# Patient Record
Sex: Male | Born: 1935
Health system: Southern US, Community
[De-identification: ages and names within clinical notes are randomized; demographics above are authoritative.]

## PROBLEM LIST (undated history)

## (undated) DIAGNOSIS — I340 Nonrheumatic mitral (valve) insufficiency: Secondary | ICD-10-CM

## (undated) DIAGNOSIS — I493 Ventricular premature depolarization: Secondary | ICD-10-CM

## (undated) DIAGNOSIS — I5189 Other ill-defined heart diseases: Secondary | ICD-10-CM

## (undated) DIAGNOSIS — I10 Essential (primary) hypertension: Secondary | ICD-10-CM

## (undated) DIAGNOSIS — E785 Hyperlipidemia, unspecified: Secondary | ICD-10-CM

## (undated) HISTORY — DX: Hyperlipidemia, unspecified: E78.5

## (undated) HISTORY — DX: Ventricular premature depolarization: I49.3

## (undated) HISTORY — DX: Essential (primary) hypertension: I10

## (undated) HISTORY — DX: Other ill-defined heart diseases: I51.89

## (undated) HISTORY — DX: Nonrheumatic mitral (valve) insufficiency: I34.0

---

## 1999-01-27 ENCOUNTER — Ambulatory Visit (HOSPITAL_BASED_OUTPATIENT_CLINIC_OR_DEPARTMENT_OTHER): Admission: RE | Admit: 1999-01-27 | Discharge: 1999-01-27 | Payer: Self-pay | Admitting: General Surgery

## 2001-04-02 ENCOUNTER — Ambulatory Visit (HOSPITAL_COMMUNITY): Admission: RE | Admit: 2001-04-02 | Discharge: 2001-04-02 | Payer: Self-pay | Admitting: Gastroenterology

## 2002-04-13 ENCOUNTER — Emergency Department (HOSPITAL_COMMUNITY): Admission: EM | Admit: 2002-04-13 | Discharge: 2002-04-14 | Payer: Self-pay | Admitting: Internal Medicine

## 2002-09-19 ENCOUNTER — Encounter: Payer: Self-pay | Admitting: Orthopedic Surgery

## 2002-09-25 ENCOUNTER — Encounter: Payer: Self-pay | Admitting: Orthopedic Surgery

## 2002-09-25 ENCOUNTER — Inpatient Hospital Stay (HOSPITAL_COMMUNITY): Admission: RE | Admit: 2002-09-25 | Discharge: 2002-09-28 | Payer: Self-pay | Admitting: Orthopedic Surgery

## 2003-05-07 ENCOUNTER — Other Ambulatory Visit: Admission: RE | Admit: 2003-05-07 | Discharge: 2003-05-07 | Payer: Self-pay | Admitting: Dermatology

## 2006-12-05 ENCOUNTER — Emergency Department (HOSPITAL_COMMUNITY): Admission: EM | Admit: 2006-12-05 | Discharge: 2006-12-05 | Payer: Self-pay | Admitting: Emergency Medicine

## 2007-09-12 ENCOUNTER — Ambulatory Visit (HOSPITAL_COMMUNITY): Admission: RE | Admit: 2007-09-12 | Discharge: 2007-09-12 | Payer: Self-pay | Admitting: Internal Medicine

## 2009-10-26 ENCOUNTER — Ambulatory Visit (HOSPITAL_COMMUNITY): Admission: RE | Admit: 2009-10-26 | Discharge: 2009-10-26 | Payer: Self-pay | Admitting: Internal Medicine

## 2010-05-28 ENCOUNTER — Ambulatory Visit: Payer: Self-pay | Admitting: Cardiology

## 2010-11-05 NOTE — Op Note (Signed)
NAME:  Shawn Stephenson, Shawn Stephenson NO.:  [id number]   MEDICAL RECORD NO.:  [id number]                   PATIENT TYPE:  INP   LOCATION:  0003                                 FACILITY:  [facility]   PHYSICIAN:  Georges Lynch. Darrelyn Hillock, M.D.             DATE OF BIRTH:  [birth year]   DATE OF PROCEDURE:  09/25/2002  DATE OF DISCHARGE:                                 OPERATIVE REPORT   SURGEON:  Georges Lynch. Darrelyn Hillock, M.D.   ASSISTANT:  Ebbie Ridge. Paitsel, P.A.   PREOPERATIVE DIAGNOSIS:  Severe degenerative arthritis of the medial joint  space of the right knee with some varus deformity.   POSTOPERATIVE DIAGNOSIS:  Severe degenerative arthritis of the medial joint  space of the right knee with some varus deformity.   OPERATION:  Repeat Repicci uni-knee arthroplasty.   COMPONENTS USED:  The sizes used were a size 48 femoral component, a size 32  tibial insert, and the insert was 6.5 mm thick.  We utilized vancomycin in  the cement.   PROCEDURE:  Under spinal anesthesia, routine orthopedic prepping and draping  of the right lower extremity was carried out.  The leg was exsanguinated  with an Esmarch and tourniquet was elevated to 350 mmHg.  An incision was  made over the medial aspect of the right knee, bleeders were identified and  cauterized.  The incision was carried down through the capsule and into the  knee joint.  The knee was suctioned out.  At this time, I dissected the soft  tissue free from the medial side of the knee and then did a quadriceps snip.  Following this, I then went on and inserted the self-retaining retractors  and removed between 5 and 8 mm thickness off of the posterior aspect of the  femur.  At this particular time, two drill holes were made, one in the tibia  at 45-degree angle and the other in the femur in the midline in the medial  femoral condyle and a distractor device was inserted and the knee was  distracted. Following this, I then went on and made  my articular burr cut  out of the central portion of the tibial plateau in order to accommodate a  6.5-mm thickness, 32 mm diameter tibial insert.  Prior to doing this I did  do a medial meniscectomy.  Once this was completed I then removed the  distraction devices and then burred down the articular surface of the femur.  A drill hole was made in the central portion of the femur after we utilized  the gouge to gouge out the distal femur.  We then made a saw cut connecting  the two holes that were made in the femur in the usual fashion and we then  went to our trials and had an excellent fit with a 48-mm trial femur and the  32 x 6.5 mm tibial insert.  Once this was done  we then injected the capsular  structures with 0.25% Marcaine with epinephrine, irrigated the knee out,  made sure there were no loose fragments into the knee, and made sure all the  spurs were removed from the distal femur medially and superiorly.  I then  cemented both components in simultaneously.  We had sponges up, one  posteriorly medially and one up into the suprapatellar region to make sure  no pieces of cement migrated into their direction.  We then utilized the  freer to remove all loose pieces of cement.  We compressed the two  components together until the cement was hardened.  We went back and made  multiple checks and made sure there were no other loose pieces of cement.  We irrigated the knee out.  We had a nice articulating surface between the  femur.  I then put a little bit of cement in the hole that we made in the  tibia proximally.  I then closed the in layers in  the usual fashion over a Hemovac drain.  We approximated the quadriceps snip  as well.  The skin was closed with metal staples and a sterile Neosporin  dressing was applied.  The patient left the operating room in satisfactory  condition.                                               Ronald A. Darrelyn Hillock, M.D.    RAG/MEDQ  D:  09/25/2002  T:   09/25/2002  Job:  [id number]

## 2010-11-05 NOTE — H&P (Signed)
NAME:  Shawn Stephenson, COWANS NO.:  [id number]   MEDICAL RECORD NO.:  [id number]                   PATIENT TYPE:  INP   LOCATION:  NA                                   FACILITY:  [facility]   PHYSICIAN:  Georges Lynch. Gioffre, M.D.             DATE OF BIRTH:  02-26-1930   DATE OF ADMISSION:  09/25/2002  DATE OF DISCHARGE:                                HISTORY & PHYSICAL   HISTORY OF PRESENT ILLNESS:  The patient has had right knee pain over the  past year.  The patient has had increasing pain in his right knee.  He has  had his knee injected in the past with Cortizone and is also taking non-  steroidal anti-inflammatory pain medications.  He is no longer getting  relief from these therapies.  He had an x-ray of his right knee which  revealed complete collapse of the medial joint space, and the patient has  elected to proceed with a right unicondylar knee replacement.   ALLERGIES:  1. CODEINE.  2. VICODIN.  3. METHAL.   PRIMARY CARE PHYSICIAN:  Dr. Ouida Sills in [place].   CURRENT MEDICATIONS:  1. Norvasc 10 mg daily.  2. Hydrochlorothiazide 25 mg daily.  3. Zebeta 5 mg daily.  4. Allegra 180 mg daily.  5. Lipitor 10 mg daily.   PAST MEDICAL HISTORY:  1. Hypertension.  2. Hemorrhoids.  3. Enlarged prostate.  4. Kidney stones.  5. Osteoarthritis.  6. Non-insulin dependent diabetes mellitus.   PAST SURGICAL HISTORY:  1. The patient had a hernia repair on the left in 1990.  2. Hernia repair on the right in 2000.   FAMILY HISTORY:  The patient's mother and father had hypertension.  Father  had diabetes.  Mother and father both have coronary artery disease.   SOCIAL HISTORY:  The patient is married.  He is currently retired.  He has  two children.  His wife will be the primary caregiver after surgery.  He  lives in a two story dwelling with 14 stairs in his home.   REVIEW OF SYMPTOMS:  GENERAL:  Denies weight change, fever, chills, fatigue.  HEENT:   Denies headache, visual changes, tinnitus, hearing loss, sore  throat.  CARDIOVASCULAR:  Denies chest pain, palpitations, shortness of  breath, hypertension.  PULMONARY:  Denies dyspnea, wheezing, cough, sputum  production, or hemoptysis.  GASTROINTESTINAL:  Denies dysphagia, nausea,  vomiting, hematemesis, or abdominal pain.  GENITOURINARY:  Denies dysuria,  frequency, urgency, nocturia, hematuria.  ENDOCRINE:  Denies polyuria,  polydipsia, appetite changes, or heat or cold intolerance.  MUSCULOSKELETAL:  The patient has pain in his right knee, otherwise negative.  NEUROLOGIC:  Denies dizziness, vertigo, syncope, seizures.  SKIN:  Denies itching,  rashes, masses, or moles.   PHYSICAL EXAMINATION:  VITAL SIGNS:  Temperature 98.1, pulse 64,  respirations 18, blood pressure 130/70 right arm sitting.  GENERAL:  A 77-  year-old male  in no acute distress.  HEENT:  Pupils equal, round, reactive  to light.  Extraocular movements were intact.  Pharynx clear.  Tympanic  membranes intact.  NECK:  Supple without masses.  CHEST:  Clear to  auscultation bilaterally.  No wheezes, rhonchi, or rales noted.  HEART:  Regular rate and rhythm without murmurs, rubs, or gallops.  ABDOMEN:  Positive bowel sounds, soft, nontender, no organomegaly or abnormal masses.  EXTREMITIES:  Examination of the right knee reveals significant genu varus,  painful range of motion, lacks 5 degrees for complete flexion and extension.  SKIN:  Warm and dry.   LABORATORY DATA:  X-ray of his right knee reveals complete collapse of the  medial joint space.   IMPRESSION:  Osteoarthritis of the right knee medial joint.   PLAN:  The patient is to be admitted to [facility] on 09/25/02, to  undergo a right unicondylar knee replacement.       Ebbie Ridge. Paitsel, P.A.                     Ronald A. Darrelyn Hillock, M.D.    Tilden Dome  D:  09/20/2002  T:  09/20/2002  Job:  [id number]

## 2010-11-05 NOTE — Procedures (Signed)
[facility]  Patient:    Shawn Stephenson, Shawn Stephenson Visit Number: [id number] MRN: [id number]          Service Type: END Location: ENDO Attending Physician:  Rich Brave Dictated by:   Florencia Reasons, M.D. Proc. Date: 04/02/01 Admit Date:  04/02/2001   CC:         Dr. Carylon Perches   Procedure Report  PROCEDURE PERFORMED:  Colonoscopy.  ENDOSCOPIST:  Florencia Reasons, M.D.  INDICATIONS FOR PROCEDURE:  Screening for colon cancer in an asymptomatic 77 year old.  FINDINGS:  Right-sided diverticulosis.  DESCRIPTION OF PROCEDURE:  The nature, purpose and risks of the procedure had been discussed with the patient, who provided written consent.  Sedation was fentanyl 50 mcg and Versed 5 mg IV without arrhythmias or desaturation. The digital exam of the prostate was normal as was perianal exam.  The adult Olympus video colonoscope was advanced without much difficult to the cecum, turning the patient into the supine position and applying some external abdominal compression to control looping.  The cecum was identified by the absence of further lumen and visualization of what appeared to be the appendiceal orifice.  Pullback was then performed.  The quality of the prep was very good and it is felt that all areas were adequately seen.  There was mild to moderate right-sided diverticular disease without diverticula noted elsewhere in the colon.  No polyps, cancer, colitis, or vascular malformations were noted and retroflexion in the rectum was unremarkable.  No biopsies were obtained.  The patient tolerated the procedure well and there were no apparent complications.  IMPRESSION:  Normal screening exam except for right-sided diverticulosis.  PLAN:  Screening sigmoidoscopy in five years, to be followed perhaps five years thereafter by repeat colonoscopy if the patient remains in good general health.Dictated by:   Florencia Reasons,  M.D. Attending Physician:  Rich Brave DD:  04/02/01 TD:  04/02/01 Job: [id number]

## 2010-11-05 NOTE — Discharge Summary (Signed)
NAME:  Shawn Stephenson, COLL NO.:  [id number]   MEDICAL RECORD NO.:  [id number]                   PATIENT TYPE:  INP   LOCATION:  0477                                 [facility]ACILITY:  [facility]   PHYSICIAN:  Georges Lynch. Darrelyn Hillock, M.D.             DATE OF BIRTH:  14-Nov-1929   DATE OF ADMISSION:  09/25/2002  DATE OF DISCHARGE:  09/28/2002                                 DISCHARGE SUMMARY   ADMISSION DIAGNOSES:  1. Osteoarthritis right knee.  2. Hypertension.  3. Hemorrhoids.  4. Large prostate.  5. Kidney stones.  6. Non-insulin-dependent diabetes mellitus.   DISCHARGE DIAGNOSES:  1. Status post unicondylar knee replacement right knee.  2. Osteoarthritis right knee.  3. Hypertension.  4. Hemorrhoids.  5. Enlarged prostate.  6. Kidney stones.  7. Non-insulin-dependent diabetes.   PROCEDURE:  The patient was taken to the operating room 09/25/2002, to  undergo a right unicondylar knee replacement, Repicci.  Surgeon Georges Lynch.  Darrelyn Hillock, M.D.  Assistant Ebbie Ridge. Paitsel, P.A.   CONSULTS:  PT, OT.   BRIEF HISTORY:  This patient is a 77 year old male who has had right knee  pain over the past year.  The patient had increasing pain in his right knee.  His knee was injected in the past with cortisone.  He is also taking  nonsteroidal anti-inflammatory pain medications.  He is no longer getting  relief from either of these therapies.  He had an x-ray of his right knee  which revealed complete collapse of the medial joint space and the patient  has elected to proceed with a unicondylar knee replacement.   LABORATORY DATA:  Pre-admission CBC:  WBC 5.4, hemoglobin 15.2, hematocrit  43.8, platelet count 181,000.  Pre-admission chemistry completely normal  except elevated glucose at 201.  Pre-admission urinalysis normal.  The  patient's blood type A-.  Negative antibody screen.  Pre-admission x-ray of  right knee reveals complete collapse of the medial joint space.  Pre-  admission chest x-ray:  No active disease.  Postoperative x-ray of the right  knee reveals medial right knee joint replacement in good position and  alignment.  I do not locate a pre-admission EKG on the patient's medical  record.   HOSPITAL COURSE:  The patient was admitted to [facility] on  09/25/2002, to undergo Repicci right unicondylar knee replacement.  The  patient tolerated the procedure well and was allowed to return to the  recovery room and then to the orthopedic floor to continue his postoperative  care.  The patient was placed on PC analgesia for pain control.  He was  weaned over to oral analgesics on postoperative day one.  Physical therapy  was consulted for gait training.  The patient was able to ambulate  postoperative day one in the room with a walker.  By postoperative day two  the patient was ambulating with the walker in the hallway.  The patient  progressed fairly well and it was felt that he could be discharged home on  postoperative day three.  The patient's hemoglobin and hematocrit were  followed throughout his hospitalization.  He had a very minimal decrease in  his hemoglobin postoperatively which stabilized at 13.6, hematocrit 39.0  prior to discharge.   DISPOSITION:  The patient was discharged home on 09/28/2002.   DISCHARGE MEDICATIONS:  1. OxyContin 20 mg one q.12h. as needed for pain.  2. Robaxin 500 mg q.6h. as needed for muscle spasm.  3. Coumadin 5 mg daily.   ACTIVITY:  Full weightbearing with walker.  [facility].   FOLLOWUP:  The patient is to follow up with Windy Fast A. Gioffre, M.D. two  weeks from the date of surgery.  He will call to schedule the appointment.   CONDITION ON DISCHARGE:  Improved.     Ebbie Ridge. Paitsel, P.A.                     Ronald A. Darrelyn Hillock, M.D.    Tilden Dome  D:  10/09/2002  T:  10/09/2002  Job:  [id number]

## 2010-11-23 ENCOUNTER — Encounter: Payer: Self-pay | Admitting: *Deleted

## 2010-11-24 ENCOUNTER — Ambulatory Visit (INDEPENDENT_AMBULATORY_CARE_PROVIDER_SITE_OTHER): Payer: Medicare Other | Admitting: Cardiology

## 2010-11-24 DIAGNOSIS — I519 Heart disease, unspecified: Secondary | ICD-10-CM | POA: Insufficient documentation

## 2010-11-24 DIAGNOSIS — E7800 Pure hypercholesterolemia, unspecified: Secondary | ICD-10-CM | POA: Insufficient documentation

## 2010-11-24 DIAGNOSIS — E119 Type 2 diabetes mellitus without complications: Secondary | ICD-10-CM | POA: Insufficient documentation

## 2010-11-24 DIAGNOSIS — I119 Hypertensive heart disease without heart failure: Secondary | ICD-10-CM

## 2010-11-24 NOTE — Assessment & Plan Note (Signed)
This elderly gentleman has a past history of essential hypertension.  He has been feeling well since last visit.  He has not been expressing any headaches or dizzy spells.  He has good exercise tolerance and does all of his own yard work including mowing the grass.

## 2010-11-24 NOTE — Assessment & Plan Note (Signed)
The patient has a past history of diastolic dysfunction.  He has not been expressing any symptoms of congestive heart failure.

## 2010-11-24 NOTE — Progress Notes (Signed)
Shawn Stephenson Date of Birth:  October 11, 1929 [facility] [address]   [address] [place], [place]  [address] [phone]           Fax   [phone]  History of Present Illness: This 77 year old gentleman is seen for a six-month followup office visit.  He has a history of hyperlipidemia and essential hypertension.  His echocardiogram in 2008 showed mild pulmonary hypertension mild mitral regurgitation and mild aortic insufficiency as well as diastolic dysfunction.  The patient has had a history of chronic PVCs.  He is not aware of any palpitations or tachycardia.  Patient has a history of diabetes.  He also has a history of BPH  Current Outpatient Prescriptions  Medication Sig Dispense Refill  . ALPHA LIPOIC ACID PO Take 200 mg by mouth daily.        Marland Kitchen amLODipine (NORVASC) 10 MG tablet Take 10 mg by mouth daily.        Marland Kitchen aspirin 81 MG tablet Take 81 mg by mouth daily.        Marland Kitchen atorvastatin (LIPITOR) 10 MG tablet Take 5 mg by mouth daily.        . carvedilol (COREG) 6.25 MG tablet Take 6.25 mg by mouth 2 (two) times daily with a meal.        . Cholecalciferol (VITAMIN D) 2000 UNITS CAPS Take 1 capsule by mouth daily.        . fexofenadine (ALLEGRA) 180 MG tablet Take 180 mg by mouth daily.        Marland Kitchen glipiZIDE (GLUCOTROL) 5 MG 24 hr tablet Take 5 mg by mouth daily.        . hydrochlorothiazide 25 MG tablet Take 25 mg by mouth daily.        Marland Kitchen MAGNESIUM PO Take by mouth daily.        . Misc Natural Products (PROSTATE) CAPS Take by mouth 2 (two) times daily.        . Multiple Vitamins-Minerals (OCUVITE PRESERVISION PO) Take 2 tablets by mouth daily.        . Omega-3 Fatty Acids (FISH OIL) 1200 MG CAPS Take 1,200 mg by mouth daily.        Marland Kitchen Ubiquinol 100 MG CAPS Take by mouth 2 (two) times daily.        . valsartan (DIOVAN) 80 MG tablet Take 80 mg by mouth daily.        . Pyridoxine HCl (VITAMIN B-6 PO) Take 2 capsules by mouth daily.        Marland Kitchen DISCONTD:  multivitamin (THERAGRAN) per tablet Take 1 tablet by mouth daily.        Marland Kitchen DISCONTD: Saw Palmetto, Serenoa repens, (SAW PALMETTO PO) Take 1 capsule by mouth daily.          Allergies  Allergen Reactions  . Vasotec Swelling    Patient Active Problem List  Diagnoses  . Benign hypertensive heart disease without heart failure  . Diabetes mellitus  . Hypercholesterolemia  . Diastolic dysfunction, left ventricle    History  Smoking status  . Never Smoker   Smokeless tobacco  . Not on file    History  Alcohol Use No    Family History  Problem Relation Age of Onset  . Stroke Father   . Diabetes Father   . Hypertension Mother   . Hypertension Father   . Arthritis Mother   . Hypertension Brother   . Stroke Brother   .  Kidney disease Brother     Review of Systems: Constitutional: no fever chills diaphoresis or fatigue or change in weight.  Head and neck: no hearing loss, no epistaxis, no photophobia or visual disturbance. Respiratory: No cough, shortness of breath or wheezing. Cardiovascular: No chest pain peripheral edema, palpitations. Gastrointestinal: No abdominal distention, no abdominal pain, no change in bowel habits hematochezia or melena. Genitourinary: No dysuria, no frequency, no urgency, no nocturia. Musculoskeletal:No arthralgias, no back pain, no gait disturbance or myalgias. Neurological: No dizziness, no headaches, no numbness, no seizures, no syncope, no weakness, no tremors. Hematologic: No lymphadenopathy, no easy bruising. Psychiatric: No confusion, no hallucinations, no sleep disturbance.    Physical Exam: Filed Vitals:   11/24/10 1138  BP: 130/70  Pulse: 66  The general appearance reveals a well-developed well-nourished elderly gentleman in no distress.Pupils equal and reactive.   Extraocular Movements are full.  There is no scleral icterus.  The mouth and pharynx are normal.  The neck is supple.  The carotids reveal no bruits.  The jugular venous  pressure is normal.  The thyroid is not enlarged.  There is no lymphadenopathy.The chest is clear to percussion and auscultation. There are no rales or rhonchi. Expansion of the chest is symmetrical.The precordium is quiet.  The first heart sound is normal.  The second heart sound is physiologically split.  There is no  gallop rub or click.There is a soft systolic ejection murmur at the base.  There is no abnormal lift or heave.The abdomen is soft and nontender. Bowel sounds are normal. The liver and spleen are not enlarged. There Are no abdominal masses. There are no bruits.The pedal pulses are good.  There is no phlebitis or edema.  There is no cyanosis or clubbing.Strength is normal and symmetrical in all extremities.  There is no lateralizing weakness.  There are no sensory deficits.The skin is warm and dry.  There is no rash.   Assessment / Plan: Continue present medication.  Recheck 6 months.

## 2011-04-06 LAB — I-STAT 8, (EC8 V) (CONVERTED LAB)
Acid-Base Excess: 3 — ABNORMAL HIGH
BUN: 20
Chloride: 103
HCT: 42
Hemoglobin: 14.3
Operator id: 179121
Potassium: 3.3 — ABNORMAL LOW
Sodium: 139

## 2011-04-06 LAB — POCT I-STAT CREATININE
Creatinine, Ser: 1.1
Operator id: 179121

## 2011-04-06 LAB — POCT CARDIAC MARKERS: Troponin i, poc: <0.05

## 2011-06-01 ENCOUNTER — Encounter: Payer: Self-pay | Admitting: Cardiology

## 2011-06-01 ENCOUNTER — Ambulatory Visit (INDEPENDENT_AMBULATORY_CARE_PROVIDER_SITE_OTHER): Payer: Medicare Other | Admitting: Cardiology

## 2011-06-01 VITALS — BP 124/78 | HR 52 | Ht 72.0 in | Wt 164.0 lb

## 2011-06-01 DIAGNOSIS — E7800 Pure hypercholesterolemia, unspecified: Secondary | ICD-10-CM

## 2011-06-01 DIAGNOSIS — I119 Hypertensive heart disease without heart failure: Secondary | ICD-10-CM

## 2011-06-01 DIAGNOSIS — I493 Ventricular premature depolarization: Secondary | ICD-10-CM | POA: Insufficient documentation

## 2011-06-01 DIAGNOSIS — I519 Heart disease, unspecified: Secondary | ICD-10-CM

## 2011-06-01 DIAGNOSIS — I4949 Other premature depolarization: Secondary | ICD-10-CM

## 2011-06-01 NOTE — Assessment & Plan Note (Signed)
Patient is not having any symptoms of congestive heart failure.  He is not having any exertional chest discomfort or angina pectoris.  He has not been aware of any palpitations

## 2011-06-01 NOTE — Assessment & Plan Note (Signed)
The patient has a history of asymptomatic PVCs.  He has not had any dizzy spells or syncope related to arrhythmia

## 2011-06-01 NOTE — Assessment & Plan Note (Signed)
The patient has not been having any dizzy spells or headaches.  No exertional dyspnea.  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

## 2011-06-01 NOTE — Progress Notes (Signed)
Shawn Stephenson Date of Birth:  1929-10-30 [facility] / [facility] [address]   [address] [place], [place]  [address] [phone]           Fax   [phone]  History of Present Illness: This pleasant elderly Caucasian male is seen for a scheduled 6 month followup office visit.  He has a history of hyperlipidemia and essential hypertension.  An echocardiogram in 2008 showing mild pulmonary hypertension, mild mitral regurgitation, and mild aortic insufficiency.  Left ventricular function showed diastolic dysfunction.  Patient has a history of chronic PVCs.  These are asymptomatic.  He has a history of diabetes and BPH  Current Outpatient Prescriptions  Medication Sig Dispense Refill  . ALPHA LIPOIC ACID PO Take 200 mg by mouth daily.        Marland Kitchen amLODipine (NORVASC) 10 MG tablet Take 10 mg by mouth daily.        Marland Kitchen aspirin 81 MG tablet Take 81 mg by mouth daily.        Marland Kitchen atorvastatin (LIPITOR) 10 MG tablet Take 5 mg by mouth daily.        . carvedilol (COREG) 6.25 MG tablet Take 6.25 mg by mouth 2 (two) times daily with a meal.        . Cholecalciferol (VITAMIN D) 2000 UNITS CAPS Take 1 capsule by mouth daily. Taking 5000 daily      . fexofenadine (ALLEGRA) 180 MG tablet Take 180 mg by mouth daily.        Marland Kitchen glipiZIDE (GLUCOTROL) 5 MG 24 hr tablet Take 5 mg by mouth daily.        . hydrochlorothiazide 25 MG tablet Take 25 mg by mouth daily.        Marland Kitchen MAGNESIUM PO Take by mouth daily.        . Misc Natural Products (PROSTATE) CAPS Take by mouth 2 (two) times daily.        . Multiple Vitamins-Minerals (OCUVITE PRESERVISION PO) Take 2 tablets by mouth daily.        . Omega-3 Fatty Acids (FISH OIL) 1200 MG CAPS Take 1,200 mg by mouth daily.        . Pyridoxine HCl (VITAMIN B-6 PO) Take 2 capsules by mouth daily.        . Red Yeast Rice Extract (RED YEAST RICE PO) Take by mouth 2 (two) times daily.        Marland Kitchen Ubiquinol 100 MG CAPS Take by mouth 2 (two) times daily.        .  valsartan (DIOVAN) 80 MG tablet Take 80 mg by mouth daily.          Allergies  Allergen Reactions  . Vasotec Swelling    Patient Active Problem List  Diagnoses  . Benign hypertensive heart disease without heart failure  . Diabetes mellitus  . Hypercholesterolemia  . Diastolic dysfunction, left ventricle    History  Smoking status  . Never Smoker   Smokeless tobacco  . Not on file    History  Alcohol Use No    Family History  Problem Relation Age of Onset  . Stroke Father   . Diabetes Father   . Hypertension Mother   . Hypertension Father   . Arthritis Mother   . Hypertension Brother   . Stroke Brother   . Kidney disease Brother     Review of Systems: Constitutional: no fever chills diaphoresis or fatigue or change in weight.  Head and neck:  no hearing loss, no epistaxis, no photophobia or visual disturbance. Respiratory: No cough, shortness of breath or wheezing. Cardiovascular: No chest pain peripheral edema, palpitations. Gastrointestinal: No abdominal distention, no abdominal pain, no change in bowel habits hematochezia or melena. Genitourinary: No dysuria, no frequency, no urgency, no nocturia. Musculoskeletal:No arthralgias, no back pain, no gait disturbance or myalgias. Neurological: No dizziness, no headaches, no numbness, no seizures, no syncope, no weakness, no tremors. Hematologic: No lymphadenopathy, no easy bruising. Psychiatric: No confusion, no hallucinations, no sleep disturbance.    Physical Exam: Filed Vitals:   06/01/11 1429  BP: 124/78  Pulse: 52   the general appearance reveals a well-developed well-nourished elderly gentleman in no distress.  Pupils equal and reactive.   Extraocular Movements are full.  There is no scleral icterus.  The mouth and pharynx are normal.  The neck is supple.  The carotids reveal no bruits.  The jugular venous pressure is normal.  The thyroid is not enlarged.  There is no lymphadenopathy.  The chest is clear  to percussion and auscultation. There are no rales or rhonchi. Expansion of the chest is symmetrical.  The heart reveals a soft systolic ejection murmur at the base.  No definite aortic insufficiency murmur heard today.  No gallop or rub.The abdomen is soft and nontender. Bowel sounds are normal. The liver and spleen are not enlarged. There Are no abdominal masses. There are no bruits.  The pedal pulses are good.  There is no phlebitis or edema.  There is no cyanosis or clubbing. Strength is normal and symmetrical in all extremities.  There is no lateralizing weakness.  There are no sensory deficits.  The skin is warm and dry.  There is no rash.  EKG shows sinus bradycardia with frequent premature ventricular beats and a pattern of left anterior hemiblock and moderate voltage for LVH  Assessment / Plan: Continue same medication.  Recheck in 6 months for office visit and EKG.  His family physician follows his blood work.

## 2011-06-01 NOTE — Patient Instructions (Signed)
Your physician recommends that you continue on your current medications as directed. Please refer to the Current Medication list given to you today.  Your physician wants you to follow-up in: 6 months. You will receive a reminder letter in the mail two months in advance. If you don't receive a letter, please call our office to schedule the follow-up appointment.  

## 2011-07-06 ENCOUNTER — Other Ambulatory Visit: Payer: Self-pay | Admitting: *Deleted

## 2011-07-06 MED ORDER — CARVEDILOL 6.25 MG PO TABS: 6.2500 mg | ORAL_TABLET | Freq: Two times a day (BID) | ORAL | Status: DC

## 2011-09-22 ENCOUNTER — Other Ambulatory Visit: Payer: Self-pay | Admitting: *Deleted

## 2011-09-22 MED ORDER — VALSARTAN 80 MG PO TABS: 80.0000 mg | ORAL_TABLET | Freq: Every day | ORAL | Status: DC

## 2011-10-03 ENCOUNTER — Other Ambulatory Visit: Payer: Self-pay | Admitting: *Deleted

## 2011-10-03 MED ORDER — ATORVASTATIN CALCIUM 10 MG PO TABS: 5.0000 mg | ORAL_TABLET | Freq: Every day | ORAL | Status: DC

## 2011-10-03 NOTE — Telephone Encounter (Signed)
Refilled atorvastatin

## 2011-12-02 ENCOUNTER — Ambulatory Visit (INDEPENDENT_AMBULATORY_CARE_PROVIDER_SITE_OTHER): Payer: Medicare Other | Admitting: Cardiology

## 2011-12-02 ENCOUNTER — Encounter: Payer: Self-pay | Admitting: Cardiology

## 2011-12-02 VITALS — BP 130/78 | HR 57 | Ht 72.0 in | Wt 163.0 lb

## 2011-12-02 DIAGNOSIS — I4949 Other premature depolarization: Secondary | ICD-10-CM

## 2011-12-02 DIAGNOSIS — E7800 Pure hypercholesterolemia, unspecified: Secondary | ICD-10-CM

## 2011-12-02 DIAGNOSIS — I493 Ventricular premature depolarization: Secondary | ICD-10-CM

## 2011-12-02 DIAGNOSIS — I119 Hypertensive heart disease without heart failure: Secondary | ICD-10-CM

## 2011-12-02 DIAGNOSIS — I519 Heart disease, unspecified: Secondary | ICD-10-CM

## 2011-12-02 NOTE — Patient Instructions (Addendum)
Your physician recommends that you continue on your current medications as directed. Please refer to the Current Medication list given to you today.  Your physician wants you to follow-up in: 6 months. You will receive a reminder letter in the mail two months in advance. If you don't receive a letter, please call our office to schedule the follow-up appointment.  

## 2011-12-02 NOTE — Assessment & Plan Note (Signed)
The patient has a history of hypercholesterolemia and is on Lipitor 10 mg daily.  Also takes red yeast rice.  His lipids are monitored by his primary care physician

## 2011-12-02 NOTE — Progress Notes (Signed)
Patient ID: Shawn Stephenson, male   DOB: Jun 18, 1930, 78 y.o.   MRN: [id number]

## 2011-12-02 NOTE — Assessment & Plan Note (Signed)
The patient denies any symptoms of orthopnea or paroxysmal nocturnal dyspnea.  He is not having any significant exertional dyspnea.  He stays physically active.

## 2011-12-02 NOTE — Assessment & Plan Note (Signed)
EKG today does show frequent PVCs including occasional interpolated PVCs.  The patient himself is not really aware of these.  He has had no awareness of tachycardia or palpitations he has not had any symptoms to suggest angina pectoris

## 2011-12-02 NOTE — Assessment & Plan Note (Signed)
Blood pressure has been remaining stable on current therapy.  He has occasional peripheral edema probably secondary to the amlodipine.  The edema is worse if he spends many hours in a day sitting.  If he is physically active the edema resolves.

## 2011-12-02 NOTE — Progress Notes (Signed)
Shawn Stephenson Date of Birth:  June 09, 1930 [facility] [address] [place], [place]  [address] [phone]         Fax   [phone]  History of Present Illness: This pleasant 78 year old gentleman is seen for a scheduled 6 month followup office visit.  He has a past history of frequent PVCs and a past history of hypertensive cardiovascular disease and a history of diastolic dysfunction of the left ventricle.  He also has hypercholesterolemia.  He is a mild diabetic and his most recent A1c taken at Dr. Alonza Smoker office was 6.3.  The patient has been feeling well with no new cardiac symptoms.  He had an echocardiogram in 2008 which showed mild pulmonary hypertension mild mitral regurgitation and mild aortic insufficiency as well as diastolic dysfunction.  Current Outpatient Prescriptions  Medication Sig Dispense Refill  . ALPHA LIPOIC ACID PO Take 200 mg by mouth daily.        Marland Kitchen amLODipine (NORVASC) 10 MG tablet Take 10 mg by mouth daily.        Marland Kitchen aspirin 81 MG tablet Take 81 mg by mouth daily.        Marland Kitchen atorvastatin (LIPITOR) 10 MG tablet Take 0.5 tablets (5 mg total) by mouth daily.  30 tablet  5  . carvedilol (COREG) 6.25 MG tablet Take 1 tablet (6.25 mg total) by mouth 2 (two) times daily with a meal.  180 tablet  3  . Cholecalciferol (VITAMIN D) 2000 UNITS CAPS Take 1 capsule by mouth daily. Taking 5000 daily      . Cyanocobalamin (VITAMIN B 12 PO) Take by mouth daily.      . fexofenadine (ALLEGRA) 180 MG tablet Take 180 mg by mouth daily.        Marland Kitchen glipiZIDE (GLUCOTROL) 5 MG 24 hr tablet Take 5 mg by mouth daily.        . hydrochlorothiazide 25 MG tablet Take 25 mg by mouth daily.        Marland Kitchen MAGNESIUM PO Take by mouth daily.        . Misc Natural Products (PROSTATE) CAPS Take by mouth 2 (two) times daily.        . Multiple Vitamins-Minerals (OCUVITE PRESERVISION PO) Take 2 tablets by mouth daily.        . Omega-3 Fatty Acids (FISH OIL) 1200 MG CAPS Take 1,200 mg  by mouth daily.        . Red Yeast Rice Extract (RED YEAST RICE PO) Take by mouth 2 (two) times daily.        . valsartan (DIOVAN) 80 MG tablet Take 1 tablet (80 mg total) by mouth daily.  30 tablet  8    Allergies  Allergen Reactions  . Vasotec Swelling    Patient Active Problem List  Diagnosis  . Benign hypertensive heart disease without heart failure  . Diabetes mellitus  . Hypercholesterolemia  . Diastolic dysfunction, left ventricle  . Asymptomatic PVCs    History  Smoking status  . Never Smoker   Smokeless tobacco  . Not on file    History  Alcohol Use No    Family History  Problem Relation Age of Onset  . Stroke Father   . Diabetes Father   . Hypertension Mother   . Hypertension Father   . Arthritis Mother   . Hypertension Brother   . Stroke Brother   . Kidney disease Brother     Review of Systems: Constitutional: no fever chills  diaphoresis or fatigue or change in weight.  Head and neck: no hearing loss, no epistaxis, no photophobia or visual disturbance. Respiratory: No cough, shortness of breath or wheezing. Cardiovascular: No chest pain peripheral edema, palpitations. Gastrointestinal: No abdominal distention, no abdominal pain, no change in bowel habits hematochezia or melena. Genitourinary: No dysuria, no frequency, no urgency, no nocturia. Musculoskeletal:No arthralgias, no back pain, no gait disturbance or myalgias. Neurological: No dizziness, no headaches, no numbness, no seizures, no syncope, no weakness, no tremors. Hematologic: No lymphadenopathy, no easy bruising. Psychiatric: No confusion, no hallucinations, no sleep disturbance.    Physical Exam: Filed Vitals:   12/02/11 1147  BP: 130/78  Pulse: 57   general appearance reveals a well-developed well-nourished gentleman in no distress.The head and neck exam reveals pupils equal and reactive.  Extraocular movements are full.  There is no scleral icterus.  The mouth and pharynx are  normal.  The neck is supple.  The carotids reveal no bruits.  The jugular venous pressure is normal.  The  thyroid is not enlarged.  There is no lymphadenopathy.  The chest is clear to percussion and auscultation.  There are no rales or rhonchi.  Expansion of the chest is symmetrical.  The precordium is quiet.  The first heart sound is normal.  The second heart sound is physiologically split.  There is no murmur gallop rub or click.  There is no abnormal lift or heave.  The pulse is regular with occasional PVCs. The abdomen is soft and nontender.  The bowel sounds are normal.  The liver and spleen are not enlarged.  There are no abdominal masses.  There are no abdominal bruits.  Extremities reveal good pedal pulses.  There is no phlebitis or edema.  There is no cyanosis or clubbing.  Strength is normal and symmetrical in all extremities.  There is no lateralizing weakness.  There are no sensory deficits.  The skin is warm and dry.  There is no rash.  EKG shows sinus bradycardia and occasional PVCs and occasional interpolated PVCs.  He has left anterior fascicular block.   Assessment / Plan: Continue same medication.  Recheck in 6 months for followup office visit.  I considered cutting back on his hydrochlorothiazide because of his diabetes but decided to continue its present dose because of his history of diastolic dysfunction.  His edema is probably from amlodipine rather than from his diastolic heart failure.

## 2012-06-27 ENCOUNTER — Ambulatory Visit (INDEPENDENT_AMBULATORY_CARE_PROVIDER_SITE_OTHER): Payer: Medicare Other | Admitting: Cardiology

## 2012-06-27 ENCOUNTER — Encounter: Payer: Self-pay | Admitting: Cardiology

## 2012-06-27 VITALS — BP 130/72 | HR 59 | Resp 18 | Ht 70.0 in | Wt 163.0 lb

## 2012-06-27 DIAGNOSIS — I119 Hypertensive heart disease without heart failure: Secondary | ICD-10-CM

## 2012-06-27 DIAGNOSIS — I493 Ventricular premature depolarization: Secondary | ICD-10-CM

## 2012-06-27 DIAGNOSIS — I4949 Other premature depolarization: Secondary | ICD-10-CM

## 2012-06-27 MED ORDER — VALSARTAN-HYDROCHLOROTHIAZIDE 80-12.5 MG PO TABS: 1.0000 | ORAL_TABLET | Freq: Every day | ORAL | Status: DC

## 2012-06-27 NOTE — Assessment & Plan Note (Signed)
Patient has not been experiencing any hypoglycemic episodes. 

## 2012-06-27 NOTE — Patient Instructions (Addendum)
Will change your Diovan and HCTZ to Diovan HCT 80/12.5 mg daily, Rx sent to pharmacy  Your physician wants you to follow-up in: 6 months ov/ekg You will receive a reminder letter in the mail two months in advance. If you don't receive a letter, please call our office to schedule the follow-up appointment.

## 2012-06-27 NOTE — Assessment & Plan Note (Signed)
The patient has a past history of PVCs which have not been causing him any symptoms.  No dizziness or syncope.

## 2012-06-27 NOTE — Assessment & Plan Note (Signed)
Patient currently is taking Diovan and hydrochlorothiazide separately and the cost of his Diovan has gone way up.  We are going to switch him to Diovan HCT 80/12.5 once a day which is a generic preparation.  Patient is not having any headaches or any symptoms of CHF.

## 2012-06-27 NOTE — Progress Notes (Signed)
Shawn Stephenson Date of Birth:  11-Sep-1929 [facility] [address] [place], [place]  [address] [phone]         Fax   [phone]  History of Present Illness: This pleasant 79 year old gentleman is seen for a scheduled 6 month followup office visit. He has a past history of frequent PVCs and a past history of hypertensive cardiovascular disease and a history of diastolic dysfunction of the left ventricle. He also has hypercholesterolemia. He is a mild diabetic.  He had recent complete lab work at his primary care physicians and everything was reviewed and checks out okay including thyroid function studies and PSA. The patient has been feeling well with no new cardiac symptoms. He had an echocardiogram in 2008 which showed mild pulmonary hypertension mild mitral regurgitation and mild aortic insufficiency as well as diastolic dysfunction.  He has a new insurance program now with [place] [facility] and he [facility] [facility]   Current Outpatient Prescriptions  Medication Sig Dispense Refill  . amLODipine (NORVASC) 10 MG tablet Take 10 mg by mouth daily.        Marland Kitchen aspirin 81 MG tablet Take 81 mg by mouth daily.        Marland Kitchen atorvastatin (LIPITOR) 10 MG tablet Take 0.5 tablets (5 mg total) by mouth daily.  30 tablet  5  . carvedilol (COREG) 6.25 MG tablet Take 1 tablet (6.25 mg total) by mouth 2 (two) times daily with a meal.  180 tablet  3  . Cholecalciferol (VITAMIN D) 2000 UNITS CAPS Take 1 capsule by mouth daily. Taking 5000 daily      . Cyanocobalamin (VITAMIN B 12 PO) Take by mouth daily.      . fexofenadine (ALLEGRA) 180 MG tablet Take 180 mg by mouth daily.        Marland Kitchen glipiZIDE (GLUCOTROL) 5 MG 24 hr tablet Take 5 mg by mouth daily.        Marland Kitchen MAGNESIUM PO Take by mouth daily.        . Misc Natural Products (PROSTATE) CAPS Take by mouth 2 (two) times daily.        . Multiple Vitamins-Minerals (OCUVITE  PRESERVISION PO) Take 2 tablets by mouth daily.        . Omega-3 Fatty Acids (FISH OIL) 1200 MG CAPS Take 1,200 mg by mouth daily.        . Red Yeast Rice Extract (RED YEAST RICE PO) Take by mouth 2 (two) times daily.        . valsartan-hydrochlorothiazide (DIOVAN HCT) 80-12.5 MG per tablet Take 1 tablet by mouth daily.  90 tablet  3    Allergies  Allergen Reactions  . Vasotec Swelling    Patient Active Problem List  Diagnosis  . Benign hypertensive heart disease without heart failure  . Diabetes mellitus  . Hypercholesterolemia  . Diastolic dysfunction, left ventricle  . Asymptomatic PVCs    History  Smoking status  . Never Smoker   Smokeless tobacco  . Not on file    History  Alcohol Use No    Family History  Problem Relation Age of Onset  . Stroke Father   . Diabetes Father   . Hypertension Mother   . Hypertension Father   . Arthritis Mother   . Hypertension Brother   . Stroke Brother   . Kidney disease Brother     Review of Systems: Constitutional: no fever chills  diaphoresis or fatigue or change in weight.  Head and neck: no hearing loss, no epistaxis, no photophobia or visual disturbance. Respiratory: No cough, shortness of breath or wheezing. Cardiovascular: No chest pain peripheral edema, palpitations. Gastrointestinal: No abdominal distention, no abdominal pain, no change in bowel habits hematochezia or melena. Genitourinary: No dysuria, no frequency, no urgency, no nocturia. Musculoskeletal:No arthralgias, no back pain, no gait disturbance or myalgias. Neurological: No dizziness, no headaches, no numbness, no seizures, no syncope, no weakness, no tremors. Hematologic: No lymphadenopathy, no easy bruising. Psychiatric: No confusion, no hallucinations, no sleep disturbance.    Physical Exam: Filed Vitals:   06/27/12 1352  BP: 130/72  Pulse: 59  Resp: 18   The general appearance reveals a pleasant elderly gentleman in no distress.The head and  neck exam reveals pupils equal and reactive.  Extraocular movements are full.  There is no scleral icterus.  The mouth and pharynx are normal.  The neck is supple.  The carotids reveal no bruits.  The jugular venous pressure is normal.  The  thyroid is not enlarged.  There is no lymphadenopathy.  The chest is clear to percussion and auscultation.  There are no rales or rhonchi.  Expansion of the chest is symmetrical.  The precordium is quiet.  The first heart sound is normal.  The second heart sound is physiologically split.  There is a soft systolic ejection murmur at the left sternal edge and aortic area .There is no abnormal lift or heave.  The abdomen is soft and nontender.  The bowel sounds are normal.  The liver and spleen are not enlarged.  There are no abdominal masses.  There are no abdominal bruits.  Extremities reveal good pedal pulses.  There is no phlebitis or edema.  There is no cyanosis or clubbing.  Strength is normal and symmetrical in all extremities.  There is no lateralizing weakness.  There are no sensory deficits.  The skin is warm and dry.  There is no rash.    Assessment / Plan: Continue on same medication except switch to Diovan HCT.  Recheck in 6 months for followup office visit and EKG.

## 2012-07-09 ENCOUNTER — Other Ambulatory Visit: Payer: Self-pay

## 2012-07-09 MED ORDER — CARVEDILOL 6.25 MG PO TABS: 6.2500 mg | ORAL_TABLET | Freq: Two times a day (BID) | ORAL | Status: DC

## 2012-09-24 ENCOUNTER — Other Ambulatory Visit: Payer: Self-pay | Admitting: *Deleted

## 2012-09-24 MED ORDER — ATORVASTATIN CALCIUM 10 MG PO TABS: 5.0000 mg | ORAL_TABLET | Freq: Every day | ORAL | Status: DC

## 2012-12-18 ENCOUNTER — Ambulatory Visit: Payer: Medicare Other | Admitting: Cardiology

## 2013-01-07 ENCOUNTER — Encounter: Payer: Self-pay | Admitting: Cardiology

## 2013-01-15 ENCOUNTER — Encounter: Payer: Self-pay | Admitting: Cardiology

## 2013-01-15 ENCOUNTER — Ambulatory Visit (INDEPENDENT_AMBULATORY_CARE_PROVIDER_SITE_OTHER): Payer: Medicare Other | Admitting: Cardiology

## 2013-01-15 VITALS — BP 152/72 | HR 48 | Ht 72.0 in | Wt 165.8 lb

## 2013-01-15 DIAGNOSIS — I4949 Other premature depolarization: Secondary | ICD-10-CM

## 2013-01-15 DIAGNOSIS — I119 Hypertensive heart disease without heart failure: Secondary | ICD-10-CM

## 2013-01-15 DIAGNOSIS — I351 Nonrheumatic aortic (valve) insufficiency: Secondary | ICD-10-CM | POA: Insufficient documentation

## 2013-01-15 DIAGNOSIS — I359 Nonrheumatic aortic valve disorder, unspecified: Secondary | ICD-10-CM

## 2013-01-15 DIAGNOSIS — I493 Ventricular premature depolarization: Secondary | ICD-10-CM

## 2013-01-15 MED ORDER — FUROSEMIDE 20 MG PO TABS: 20.0000 mg | ORAL_TABLET | Freq: Every day | ORAL | Status: DC

## 2013-01-15 MED ORDER — LOSARTAN POTASSIUM 50 MG PO TABS: 50.0000 mg | ORAL_TABLET | Freq: Every day | ORAL | Status: DC

## 2013-01-15 NOTE — Assessment & Plan Note (Signed)
The patient is not having any symptoms referable to his aortic insufficiency.  Exercise tolerance is still good.

## 2013-01-15 NOTE — Assessment & Plan Note (Signed)
Blood pressure here in the office shows his systolic blood he may have white coat syndrome.  He wondered to start checking his blood pressure at home twice a week.

## 2013-01-15 NOTE — Patient Instructions (Signed)
Monitor blood pressure 2 times a week at home  STOP DIOVAN HCT  START LOSARTAN 50 MG DAILY  USE LASIX (FUROSEMIDE) 20 MG DAILY AS NEEDED FOR FLUID  Your physician wants you to follow-up in: 6 MONTHS You will receive a reminder letter in the mail two months in advance. If you don't receive a letter, please call our office to schedule the follow-up appointment.

## 2013-01-15 NOTE — Progress Notes (Addendum)
[name] Date of Birth:  Sep 14, 1929 [facility] [address] [place], [place]  [address] [phone]         Fax   [phone]  This pleasant 79 year old gentleman is seen for a scheduled  followup office visit. He has a past history of frequent PVCs and a past history of hypertensive cardiovascular disease and a history of diastolic dysfunction of the left ventricle. He also has hypercholesterolemia. He is a mild diabetic and his most recent A1c taken at Dr. Alonza Smoker office was 5.9 The patient has been feeling well with no new cardiac symptoms. He had an echocardiogram in 2008 which showed mild pulmonary hypertension mild mitral regurgitation and mild aortic insufficiency as well as diastolic dysfunction. He is concerned that his hydrochlorothiazide may be adversely affecting his diabetes    Current Outpatient Prescriptions  Medication Sig Dispense Refill  . amLODipine (NORVASC) 10 MG tablet Take 10 mg by mouth daily.        Marland Kitchen aspirin 81 MG tablet Take 81 mg by mouth daily.        Marland Kitchen atorvastatin (LIPITOR) 10 MG tablet Take 0.5 tablets (5 mg total) by mouth daily.  30 tablet  5  . carvedilol (COREG) 6.25 MG tablet Take 1 tablet (6.25 mg total) by mouth 2 (two) times daily with a meal.  180 tablet  3  . Cholecalciferol (VITAMIN D) 2000 UNITS CAPS Take 1 capsule by mouth daily. Taking 5000 daily      . Cyanocobalamin (VITAMIN B 12 PO) Take by mouth daily.      . fexofenadine (ALLEGRA) 180 MG tablet Take 180 mg by mouth daily.        Marland Kitchen glipiZIDE (GLUCOTROL) 5 MG 24 hr tablet Take 5 mg by mouth daily.        Marland Kitchen MAGNESIUM PO Take by mouth daily.        . Misc Natural Products (PUMPKIN SEED OIL) CAPS Take 2 capsules by mouth daily.      . Multiple Vitamins-Minerals (OCUVITE PRESERVISION PO) Take 2 tablets by mouth daily.        . furosemide (LASIX) 20 MG tablet Take 20 mg by mouth daily as needed.      Marland Kitchen losartan (COZAAR) 50 MG tablet Take 1 tablet (50 mg total)  by mouth daily.  90 tablet  3   No current facility-administered medications for this visit.    Allergies  Allergen Reactions  . Vasotec Swelling    Patient Active Problem List   Diagnosis Date Noted  . Asymptomatic PVCs 06/01/2011  . Benign hypertensive heart disease without heart failure 11/24/2010  . Diabetes mellitus 11/24/2010  . Hypercholesterolemia 11/24/2010  . Diastolic dysfunction, left ventricle 11/24/2010    History  Smoking status  . Never Smoker   Smokeless tobacco  . Not on file    History  Alcohol Use No    Family History  Problem Relation Age of Onset  . Stroke Father   . Diabetes Father   . Hypertension Mother   . Hypertension Father   . Arthritis Mother   . Hypertension Brother   . Stroke Brother   . Kidney disease Brother     Review of Systems: Constitutional: no fever chills diaphoresis or fatigue or change in weight.  Head and neck: no hearing loss, no epistaxis, no photophobia or visual disturbance. Respiratory: No cough, shortness of breath or wheezing. Cardiovascular: No chest pain peripheral edema, palpitations. Gastrointestinal: No abdominal distention, no abdominal  pain, no change in bowel habits hematochezia or melena. Genitourinary: No dysuria, no frequency, no urgency, no nocturia. Musculoskeletal:No arthralgias, no back pain, no gait disturbance or myalgias. Neurological: No dizziness, no headaches, no numbness, no seizures, no syncope, no weakness, no tremors. Hematologic: No lymphadenopathy, no easy bruising. Psychiatric: No confusion, no hallucinations, no sleep disturbance.    Physical Exam: Filed Vitals:   01/15/13 1120  BP: 152/72  Pulse: 48   the general appearance reveals a well-developed well-nourished elderly gentleman in no distress.The head and neck exam reveals pupils equal and reactive.  Extraocular movements are full.  There is no scleral icterus.  The mouth and pharynx are normal.  The neck is supple.  The  carotids reveal no bruits.  The jugular venous pressure is normal.  The  thyroid is not enlarged.  There is no lymphadenopathy.  The chest is clear to percussion and auscultation.  There are no rales or rhonchi.  Expansion of the chest is symmetrical.  The precordium is quiet.  The first heart sound is normal.  The second heart sound is physiologically split.  There is a grade 2/6 systolic ejection murmur at the base.  No diastolic murmur heard.  No gallop or rub. There is no abnormal lift or heave.  The abdomen is soft and nontender.  The bowel sounds are normal.  The liver and spleen are not enlarged.  There are no abdominal masses.  There are no abdominal bruits.  Extremities reveal good pedal pulses.  There is no phlebitis or edema.  There is no cyanosis or clubbing.  Strength is normal and symmetrical in all extremities.  There is no lateralizing weakness.  There are no sensory deficits.  The skin is warm and dry.  There is no rash.     Assessment / Plan: Overall the patient is doing well.  At his request we will stop his hydrochlorothiazide.  2 save costs we will switch him from plain Diovan which is extensive back to losartan 50 mg one daily.  If he has problems with fluid retention he will have Lasix 20 mg on hand for when necessary use only. Recheck in 6 months for followup office visit

## 2013-01-15 NOTE — Assessment & Plan Note (Signed)
The patient has not been experiencing any sensation of palpitations or tachycardia

## 2013-01-16 ENCOUNTER — Telehealth: Payer: Self-pay | Admitting: Cardiology

## 2013-01-16 NOTE — Telephone Encounter (Signed)
New Prob     Pt has some questions regarding FUROSEMIDE and some possible allergies he may have. Please call.

## 2013-01-16 NOTE — Telephone Encounter (Signed)
Discussed with  Dr. Patty Sermons and ok to use, call if any problems

## 2013-01-16 NOTE — Telephone Encounter (Signed)
Patient has allergy to sulfa (was not on our allergy list) and was Rx'd Lasix for PRN use. Patient does not remember reactions but does not think it was severe. Will forward to  Dr. Patty Sermons for review

## 2013-01-23 ENCOUNTER — Telehealth: Payer: Self-pay | Admitting: Cardiology

## 2013-01-23 ENCOUNTER — Other Ambulatory Visit: Payer: Self-pay

## 2013-01-23 NOTE — Telephone Encounter (Signed)
Have him stop the losartan.  I have marked his allergies as including losartan going forward.  Continue his present blood pressure meds.

## 2013-01-23 NOTE — Telephone Encounter (Signed)
New Prob  Pt has started a new Blood pressure and is experiencing pounding in his chest. Since he has started  losarpan 50 mg// He feels as if he is getting extra heart beats.  150/90 pulse 64.

## 2013-01-23 NOTE — Telephone Encounter (Signed)
Returned call to patient he stated he started on Losartan 1 week ago.Stated he don't feel good.Stated since he started Losartan he feels jumpy,hyper,chest pressure.Stated he also had swelling in ankles yesterday and had to take a Lasix.B/P 150/90,150/80,140/80.Message sent to Dr.Brackbill for advice.

## 2013-01-23 NOTE — Telephone Encounter (Signed)
Returned call to patient Dr.Brackbill advised to stop Losartan.Continue present blood pressure medication.Advised to monitor B/P and call back if needed.

## 2013-01-24 ENCOUNTER — Encounter: Payer: Self-pay | Admitting: *Deleted

## 2013-01-24 ENCOUNTER — Telehealth: Payer: Self-pay | Admitting: Cardiology

## 2013-01-24 NOTE — Telephone Encounter (Signed)
Spoke with patient about continuing to monitor BP and HR and to call us if it remained elevated or HR became irregular.

## 2013-01-24 NOTE — Telephone Encounter (Signed)
New prob  Pt wife would like to speak with a nurse regarding her husbands BP.  At 8:30 this morning his BP 170/80 hr 68 and irregular before he took his BP meds. She said after taking his BP meds it was 140/60 with a regular heart.

## 2013-07-13 ENCOUNTER — Other Ambulatory Visit: Payer: Self-pay | Admitting: Cardiology

## 2013-07-17 ENCOUNTER — Telehealth: Payer: Self-pay | Admitting: Cardiology

## 2013-07-17 NOTE — Telephone Encounter (Signed)
New message          Pt wife has questions about husband meds

## 2013-07-17 NOTE — Telephone Encounter (Signed)
Discussed medications and he is currently not taking Losartan as they have been doing.

## 2013-07-30 ENCOUNTER — Ambulatory Visit: Payer: Medicare Other | Admitting: Cardiology

## 2013-08-09 ENCOUNTER — Ambulatory Visit: Payer: Medicare Other | Admitting: Cardiology

## 2013-08-13 ENCOUNTER — Ambulatory Visit: Payer: Medicare Other | Admitting: Cardiology

## 2013-08-13 ENCOUNTER — Telehealth: Payer: Self-pay | Admitting: Nurse Practitioner

## 2013-08-13 NOTE — Telephone Encounter (Signed)
Phone call today - was not able to come to his scheduled visit today due to inclement weather.  Has noted a feeling of "pressure around his throat" for several weeks - seems to be worse with eating - he was planning on talking to Dr. Patty SermonsBrackbill about this today. BP running high as well. 170/70 after medicines today. He was told he could not get an appointment for 6 more weeks.   Will increase the Coreg to 9.75 (1 1/2 pills) BID  Will have my CMA call him tomorrow to get an earlier visit on a day that Dr. Patty SermonsBrackbill is also here in the office.   Patient is agreeable to this plan and will call if any problems develop in the interim.   Rosalio MacadamiaLori C. Emmeline Winebarger, RN, ANP-C [facility] [address] [place], [place]  [id number] [phone]

## 2013-08-14 ENCOUNTER — Telehealth: Payer: Self-pay | Admitting: *Deleted

## 2013-08-14 NOTE — Telephone Encounter (Signed)
Left message on machine for pt to contact the office and I will try later today office will be closing at 3:00 pm today I let pt know if pt tries to call me back

## 2013-08-14 NOTE — Telephone Encounter (Signed)
Message copied by Debbe BalesGONZALEZ, DANIELLE R on Wed Aug 14, 2013  9:41 AM ------      Message from: Rosalio MacadamiaGERHARDT, LORI C      Created: Tue Aug 13, 2013 12:56 PM       Please call on Wednesday - schedule a visit with me on a day that Dr. Patty SermonsBrackbill is here.            See my phone note for details            Please call him on his cell phone - [phone]            lori ------

## 2013-08-16 NOTE — Telephone Encounter (Signed)
S/w pt is agreeable to come in for appointment March 6 with Dr. Patty SermonsBrackbill in the office

## 2013-08-16 NOTE — Telephone Encounter (Signed)
Message copied by Debbe BalesGONZALEZ, DANIELLE R on Fri Aug 16, 2013  9:00 AM ------      Message from: Rosalio MacadamiaGERHARDT, LORI C      Created: Tue Aug 13, 2013 12:56 PM       Please call on Wednesday - schedule a visit with me on a day that Dr. Patty SermonsBrackbill is here.            See my phone note for details            Please call him on his cell phone - [phone]            lori ------

## 2013-08-23 ENCOUNTER — Encounter: Payer: Self-pay | Admitting: Nurse Practitioner

## 2013-08-23 ENCOUNTER — Ambulatory Visit (INDEPENDENT_AMBULATORY_CARE_PROVIDER_SITE_OTHER): Payer: Medicare Other | Admitting: Nurse Practitioner

## 2013-08-23 VITALS — BP 168/79 | HR 57 | Ht 72.0 in | Wt 168.0 lb

## 2013-08-23 DIAGNOSIS — I119 Hypertensive heart disease without heart failure: Secondary | ICD-10-CM

## 2013-08-23 DIAGNOSIS — I519 Heart disease, unspecified: Secondary | ICD-10-CM

## 2013-08-23 DIAGNOSIS — E7800 Pure hypercholesterolemia, unspecified: Secondary | ICD-10-CM

## 2013-08-23 LAB — BASIC METABOLIC PANEL
BUN: 20 mg/dL (ref 6–23)
CO2: 31 mEq/L (ref 19–32)
Calcium: 9.3 mg/dL (ref 8.4–10.5)
Chloride: 101 mEq/L (ref 96–112)
Creatinine, Ser: 0.9 mg/dL (ref 0.4–1.5)
GFR: 88.09 mL/min (ref >60.00)
Glucose, Bld: 114 mg/dL — ABNORMAL HIGH (ref 70–99)
Potassium: 4.1 mEq/L (ref 3.5–5.1)
Sodium: 140 mEq/L (ref 135–145)

## 2013-08-23 MED ORDER — HYDROCHLOROTHIAZIDE 25 MG PO TABS: 25.0000 mg | ORAL_TABLET | Freq: Every day | ORAL | Status: DC

## 2013-08-23 NOTE — Patient Instructions (Addendum)
Stay on your current medicines but I am adding back the HCTZ 25 mg daily for your blood pressure  We will check lab today  We will set up a stress test (low level exercise/Lexiscan)  See Dr. Patty SermonsBrackbill in 3 to 4 weeks with repeat lab  Call the [facility] office at [phone] if you have any questions, problems or concerns.

## 2013-08-23 NOTE — Progress Notes (Signed)
Shawn Stephenson Date of Birth: Nov 03, 1929 Medical Record #[id number]  History of Present Illness: Mr. Shawn Stephenson is seen back today for a follow up visit. Seen for Dr. Patty Sermons. He is an 80 year old male with known PVCs, HLD, HTN, borderline DM and diastolic dysfunction. Last echo from 2011 showed mild pulmonary HTN, mild MR and mild AI along with diastolic dysfunction. Labs are generally checked by his PCP.  Last seen here back in July and was felt to be doing well. Had some medicine changes due to costs and concerns. Since that visit, he has had his blood pressure medicine changed over to Coreg. Losartan   Was to have been seen last month but got rescheduled due to inclement weather. Called and actually spoke with me since I was covering the outpatient setting to report that he had been having some "pressure around his throat" and BP was up. Coreg was increased and his follow up visit was moved up with me.   Comes back today. Here alone. Says he is not sure how he is doing. BP still running high. Thinking about resuming HCTZ. Not dizzy or lightheaded. No actual chest pain but has had a sensation going up his neck/throat - can't tell if its reflux or something else. No exertional symptoms. He is active but not routinely exercising. Rarely using Lasix. Sounds like he does get some extra salt on occasion. No swelling.   Current Outpatient Prescriptions  Medication Sig Dispense Refill  . amLODipine (NORVASC) 10 MG tablet Take 10 mg by mouth daily.        Marland Kitchen aspirin 81 MG tablet Take 81 mg by mouth daily.        Marland Kitchen atorvastatin (LIPITOR) 10 MG tablet Take 0.5 tablets (5 mg total) by mouth daily.  30 tablet  5  . carvedilol (COREG) 6.25 MG tablet 1 1/2 pills BID      . Cholecalciferol (VITAMIN D) 2000 UNITS CAPS Take 1 capsule by mouth daily. Taking 5000 daily      . Cyanocobalamin (VITAMIN B 12 PO) Take by mouth daily.      . fexofenadine (ALLEGRA) 180 MG tablet Take 180 mg by mouth daily.          . furosemide (LASIX) 20 MG tablet Take 20 mg by mouth daily as needed.      Marland Kitchen glipiZIDE (GLUCOTROL) 5 MG 24 hr tablet Take 5 mg by mouth daily.        Marland Kitchen MAGNESIUM PO Take by mouth daily.        . Misc Natural Products (PUMPKIN SEED OIL) CAPS Take 2 capsules by mouth daily.      . Multiple Vitamins-Minerals (OCUVITE PRESERVISION PO) Take 2 tablets by mouth daily.         No current facility-administered medications for this visit.    Allergies  Allergen Reactions  . Losartan     Makes him feel jittery  . Sulfa Antibiotics   . Vasotec Swelling    Past Medical History  Diagnosis Date  . Hypertension   . Hyperlipidemia   . Mitral regurgitation     mild  . Diastolic dysfunction   . Diabetes mellitus     well controlled  . Asymptomatic PVCs     History reviewed. No pertinent past surgical history.  History  Smoking status  . Never Smoker   Smokeless tobacco  . Not on file    History  Alcohol Use No    Family History  Problem  Relation Age of Onset  . Stroke Father   . Diabetes Father   . Hypertension Mother   . Hypertension Father   . Arthritis Mother   . Hypertension Brother   . Stroke Brother   . Kidney disease Brother     Review of Systems: The review of systems is per the HPI.  All other systems were reviewed and are negative.  Physical Exam: BP 168/79  Pulse 57  Ht 6' (1.829 m)  Wt 168 lb (76.204 kg)  BMI 22.78 kg/m2 Patient is alert and in no acute distress. Skin is warm and dry. Color is normal.  HEENT is unremarkable. Normocephalic/atraumatic. PERRL. Sclera are nonicteric. Neck is supple. No masses. No JVD. Lungs are clear. Cardiac exam shows a regular rate and rhythm. Abdomen is soft. Extremities are without edema. Gait and ROM are intact. No gross neurologic deficits noted.  LABORATORY DATA:  EKG today shows sinus brady/arrhythmia with LAFB.   BMET pending  Lab Results  Component Value Date   HGB 14.3 12/05/2006   HCT 42.0 12/05/2006    GLUCOSE 118* 12/05/2006   NA 139 12/05/2006   K 3.3* 12/05/2006   CL 103 12/05/2006   CREATININE 1.1 12/05/2006   BUN 20 12/05/2006    Assessment / Plan: 1. Throat discomfort - seems atypical but has multiple risk factors - will arrange for Myoview - probably a low level Lexiscan.   2. HTN - BP not at goal - he is wanting to resume HCTZ - I have restarted 25 mg daily - check BMET today  3. HLD - on statin  4. Diastolic dysfunction - seems compensated but needs modification of his risk factors. Encouraged salt restriction and BP control.   Patient is agreeable to this plan and will call if any problems develop in the interim.   Shawn Stephenson Innes, RN, ANP-C [facility] [address] [place], [place]  [id number] [phone]

## 2013-09-05 ENCOUNTER — Telehealth: Payer: Self-pay | Admitting: Cardiology

## 2013-09-05 NOTE — Telephone Encounter (Signed)
Advised wife  

## 2013-09-05 NOTE — Telephone Encounter (Signed)
Okay to postpone the Myoview until he gets back

## 2013-09-05 NOTE — Telephone Encounter (Signed)
Wife phoned stating that patient had to go out of town secondary to brother not doing well and is Hospice. Wife concerned and wants to make sure he does not need to come home for myoview (scheduled for 3/23) He has been gone since Friday so she is unsure how he is doing. She did check his blood pressure day after he was seen by Dawayne PatriciaLori G NP and blood pressure systolic 10 points better. She will see if he can start monitoring blood pressure while he is away. Will forward to  Dr. Patty [facility]ermonsBrackbill to see [facility]

## 2013-09-05 NOTE — Telephone Encounter (Signed)
New Prob   Has some questions regarding upcoming stress test appointment. Please call.

## 2013-09-09 ENCOUNTER — Encounter (HOSPITAL_COMMUNITY): Payer: Medicare Other

## 2013-09-14 ENCOUNTER — Other Ambulatory Visit: Payer: Self-pay | Admitting: Cardiology

## 2013-09-17 ENCOUNTER — Telehealth: Payer: Self-pay | Admitting: Cardiology

## 2013-09-17 ENCOUNTER — Other Ambulatory Visit: Payer: Self-pay | Admitting: Cardiology

## 2013-09-17 NOTE — Telephone Encounter (Signed)
Advised patient and scheduled appointment next week with  Dr. Patty SermonsBrackbill. Wife is very anxious about his blood pressure so she will call back Thursday with blood pressure readings. Will forward to Nathanial MillmanAnita H RN since I will be out and to  Dr. Patty SermonsBrackbill for review

## 2013-09-17 NOTE — Telephone Encounter (Signed)
New message    Pt saw his PCP this week and his bp was 170/80. He was instructed to call Dr Patty SermonsBrackbill and let him know.

## 2013-09-17 NOTE — Telephone Encounter (Signed)
Agree with plan 

## 2013-09-17 NOTE — Telephone Encounter (Signed)
Would just monitor for a day or so and then see if we need to make adjustments.

## 2013-09-17 NOTE — Telephone Encounter (Signed)
Patient just recently lost his brother and was out of town when he had to go to Urgent Care for bronchitis. He had taken codeine and his blood pressure was down to 130/80. Wife has not been checking his blood pressure at home secondary to good reading. Patient went to see PCP today and blood pressure was up to 170/80. Wife checked blood pressure at home and 160/70 heart rate 60. [facility] to [facility]chedule myoview since he had to reschedule secondary to brother. Will forward to Dawayne PatriciaLori G NP to review blood pressure readings, wife is very concerned.

## 2013-09-18 NOTE — Telephone Encounter (Signed)
Advise patient: 

## 2013-09-19 ENCOUNTER — Telehealth: Payer: Self-pay | Admitting: Cardiology

## 2013-09-19 NOTE — Telephone Encounter (Signed)
Agree.  Continue current meds 

## 2013-09-19 NOTE — Telephone Encounter (Signed)
lmtcb

## 2013-09-19 NOTE — Telephone Encounter (Signed)
Patient would like to speak with you regarding her husband, [name] told her to call you if she had additional questions. Please call and advise.

## 2013-09-19 NOTE — Telephone Encounter (Signed)
Wife calling with BP readings.  On 3/31 160/80 HR 60; on 4/1 in AM 130/70 HR 60 at 5 pm 150/80 HR 60; today 4/2 at 12:00 150/80 HR 60. She states he feels OK except is under a lot of stress since his brother died because he is the executor of the estate.  Is scheduled to see Dr. Patty SermonsBrackbill on Tues.  Advised will forward results to him.  Also showed Sunday SpillersLori Gerhardt,NP the readings who stated she would not change any medications.

## 2013-09-20 ENCOUNTER — Other Ambulatory Visit: Payer: Self-pay

## 2013-09-20 MED ORDER — CARVEDILOL 6.25 MG PO TABS: ORAL_TABLET | ORAL | Status: DC

## 2013-09-20 NOTE — Telephone Encounter (Signed)
Advised wife  

## 2013-09-24 ENCOUNTER — Encounter: Payer: Self-pay | Admitting: Cardiology

## 2013-09-24 ENCOUNTER — Ambulatory Visit (INDEPENDENT_AMBULATORY_CARE_PROVIDER_SITE_OTHER): Payer: Medicare Other | Admitting: Cardiology

## 2013-09-24 VITALS — BP 160/80 | HR 64 | Ht 72.0 in | Wt 165.0 lb

## 2013-09-24 DIAGNOSIS — I519 Heart disease, unspecified: Secondary | ICD-10-CM

## 2013-09-24 DIAGNOSIS — I4949 Other premature depolarization: Secondary | ICD-10-CM

## 2013-09-24 DIAGNOSIS — I359 Nonrheumatic aortic valve disorder, unspecified: Secondary | ICD-10-CM

## 2013-09-24 DIAGNOSIS — I119 Hypertensive heart disease without heart failure: Secondary | ICD-10-CM

## 2013-09-24 DIAGNOSIS — I493 Ventricular premature depolarization: Secondary | ICD-10-CM

## 2013-09-24 DIAGNOSIS — IMO0001 Reserved for inherently not codable concepts without codable children: Secondary | ICD-10-CM

## 2013-09-24 DIAGNOSIS — E1165 Type 2 diabetes mellitus with hyperglycemia: Secondary | ICD-10-CM

## 2013-09-24 DIAGNOSIS — I351 Nonrheumatic aortic (valve) insufficiency: Secondary | ICD-10-CM

## 2013-09-24 MED ORDER — CARVEDILOL 12.5 MG PO TABS: 12.5000 mg | ORAL_TABLET | Freq: Two times a day (BID) | ORAL | Status: DC

## 2013-09-24 NOTE — Assessment & Plan Note (Signed)
Blood pressure has been slightly elevated, particularly the systolic.  He brought in a list of his blood pressures which are averaging in the 160/80 range.  Associated with this he has been experiencing some pressure in the neck and upper chest at times.  Is not related to exertion.  Because of his risk factors he is scheduled for a nuclear stress test on 09/30/13.  It will be a walking Lexa scan Myoview.

## 2013-09-24 NOTE — Patient Instructions (Addendum)
INCREASE YOUR CARVEDILOL TO 12.5 MG TWICE A DAY, DOUBLE UP ON THE 6.25 MG TABLETS UNTIL FINISHED AND NEW RX SENT TO PHARMACY FOR 12.5 MG TABLETS  Your physician wants you to follow-up in: 3 MONTH OV/EKG  You will receive a reminder letter in the mail two months in advance. If you don't receive a letter, please call our office to schedule the follow-up appointment.   KEEP YOUR NUCLEAR APPOINTMENT FOR NEXT WEEK

## 2013-09-24 NOTE — Assessment & Plan Note (Signed)
The patient has a long history of asymptomatic PVCs.  He has not had any sustained tachycardia dizziness or syncope

## 2013-09-24 NOTE — Assessment & Plan Note (Signed)
The patient is not having any symptoms to suggest fluid overload.  He is back on his hydrochlorothiazide for blood pressure control.

## 2013-09-24 NOTE — Progress Notes (Signed)
Shawn Stephenson Date of Birth:  [birth year] [address] [place], [place]  [address] [phone]         Fax   [phone]  This pleasant 80 year old gentleman is seen for a scheduled  followup office visit. He has a past history of frequent PVCs and a past history of hypertensive cardiovascular disease and a history of diastolic dysfunction of the left ventricle. He also has hypercholesterolemia. He is a mild diabetic.  Recently he has been having more difficulty with his high blood pressure.  The patient had an EKG on 08/23/13 which showed normal sinus rhythm with left anterior hemiblock and occasional PACs. His last echocardiogram was 10/29/09 and showed left ventricular hypertrophy with normal systolic function and with grade 1 diastolic dysfunction.  There was also mild mitral valve prolapse, mild aortic sclerosis, and mild elevation of pulmonary artery pressure to 44 mm mercury .   Current Outpatient Prescriptions  Medication Sig Dispense Refill  . amLODipine (NORVASC) 10 MG tablet Take 10 mg by mouth daily.        Marland Kitchen aspirin 81 MG tablet Take 81 mg by mouth daily.        Marland Kitchen atorvastatin (LIPITOR) 10 MG tablet TAKE ONE-HALF TABLET BY MOUTH ONCE A DAY  30 tablet  0  . atorvastatin (LIPITOR) 10 MG tablet TAKE ONE-HALF TABLET BY MOUTH ONCE A DAY  15 tablet  0  . carvedilol (COREG) 12.5 MG tablet Take 1 tablet (12.5 mg total) by mouth 2 (two) times daily.  180 tablet  2  . Cholecalciferol (VITAMIN D) 2000 UNITS CAPS Take 1 capsule by mouth daily. Taking 5000 daily      . Cyanocobalamin (VITAMIN B 12 PO) Take by mouth daily.      . fexofenadine (ALLEGRA) 180 MG tablet Take 180 mg by mouth daily.        . furosemide (LASIX) 20 MG tablet Take 20 mg by mouth daily as needed.      Marland Kitchen glipiZIDE (GLUCOTROL) 5 MG 24 hr tablet Take 5 mg by mouth daily.        . hydrochlorothiazide (HYDRODIURIL) 25 MG tablet Take 1 tablet (25 mg total) by mouth daily.  90 tablet  3  . MAGNESIUM PO Take  by mouth daily.        . Misc Natural Products (PUMPKIN SEED OIL) CAPS Take 2 capsules by mouth daily.      . Multiple Vitamins-Minerals (OCUVITE PRESERVISION PO) Take 2 tablets by mouth daily.         No current facility-administered medications for this visit.    Allergies  Allergen Reactions  . Losartan     Makes him feel jittery  . Sulfa Antibiotics   . Vasotec Swelling    Patient Active Problem List   Diagnosis Date Noted  . Mild aortic insufficiency 01/15/2013  . Asymptomatic PVCs 06/01/2011  . Benign hypertensive heart disease without heart failure 11/24/2010  . Diabetes mellitus 11/24/2010  . Hypercholesterolemia 11/24/2010  . Diastolic dysfunction, left ventricle 11/24/2010    History  Smoking status  . Never Smoker   Smokeless tobacco  . Not on file    History  Alcohol Use No    Family History  Problem Relation Age of Onset  . Stroke Father   . Diabetes Father   . Hypertension Mother   . Hypertension Father   . Arthritis Mother   . Hypertension Brother   . Stroke Brother   . Kidney disease  Brother     Review of Systems: Constitutional: no fever chills diaphoresis or fatigue or change in weight.  Head and neck: no hearing loss, no epistaxis, no photophobia or visual disturbance. Respiratory: No cough, shortness of breath or wheezing. Cardiovascular: No chest pain peripheral edema, palpitations. Gastrointestinal: No abdominal distention, no abdominal pain, no change in bowel habits hematochezia or melena. Genitourinary: No dysuria, no frequency, no urgency, no nocturia. Musculoskeletal:No arthralgias, no back pain, no gait disturbance or myalgias. Neurological: No dizziness, no headaches, no numbness, no seizures, no syncope, no weakness, no tremors. Hematologic: No lymphadenopathy, no easy bruising. Psychiatric: No confusion, no hallucinations, no sleep disturbance.    Physical Exam: Filed Vitals:   09/24/13 1451  BP: 160/80  Pulse: 64   the  general appearance reveals a well-developed well-nourished elderly gentleman in no distress.The head and neck exam reveals pupils equal and reactive.  Extraocular movements are full.  There is no scleral icterus.  The mouth and pharynx are normal.  The neck is supple.  The carotids reveal no bruits.  The jugular venous pressure is normal.  The  thyroid is not enlarged.  There is no lymphadenopathy.  The chest is clear to percussion and auscultation.  There are no rales or rhonchi.  Expansion of the chest is symmetrical.  The precordium is quiet.  The first heart sound is normal.  The second heart sound is physiologically split.  There is a grade 2/6 systolic ejection murmur at the base.  No diastolic murmur heard.  No gallop or rub. There is no abnormal lift or heave.  The abdomen is soft and nontender.  The bowel sounds are normal.  The liver and spleen are not enlarged.  There are no abdominal masses.  There are no abdominal bruits.  Extremities reveal good pedal pulses.  There is no phlebitis or edema.  There is no cyanosis or clubbing.  Strength is normal and symmetrical in all extremities.  There is no lateralizing weakness.  There are no sensory deficits.  The skin is warm and dry.  There is no rash.     Assessment / Plan: Continue same medication except increase carvedilol to 12.5 mg twice a day. Return next week for walking Lexa scan Myoview. Recheck in 3 months for office visit and EKG Consider followup echocardiogram after next office visit to evaluate basilar systolic murmur.  His last echo was in 2011.

## 2013-09-30 ENCOUNTER — Ambulatory Visit (INDEPENDENT_AMBULATORY_CARE_PROVIDER_SITE_OTHER): Payer: Medicare Other | Admitting: *Deleted

## 2013-09-30 ENCOUNTER — Ambulatory Visit: Payer: Medicare Other | Admitting: Cardiology

## 2013-09-30 ENCOUNTER — Ambulatory Visit (HOSPITAL_COMMUNITY): Payer: Medicare Other | Attending: Cardiovascular Disease | Admitting: Radiology

## 2013-09-30 VITALS — BP 142/80 | Ht 72.0 in | Wt 163.0 lb

## 2013-09-30 DIAGNOSIS — R079 Chest pain, unspecified: Secondary | ICD-10-CM

## 2013-09-30 DIAGNOSIS — I493 Ventricular premature depolarization: Secondary | ICD-10-CM

## 2013-09-30 DIAGNOSIS — I119 Hypertensive heart disease without heart failure: Secondary | ICD-10-CM

## 2013-09-30 DIAGNOSIS — I4949 Other premature depolarization: Secondary | ICD-10-CM

## 2013-09-30 DIAGNOSIS — E7800 Pure hypercholesterolemia, unspecified: Secondary | ICD-10-CM

## 2013-09-30 DIAGNOSIS — I519 Heart disease, unspecified: Secondary | ICD-10-CM

## 2013-09-30 DIAGNOSIS — R002 Palpitations: Secondary | ICD-10-CM | POA: Insufficient documentation

## 2013-09-30 LAB — BASIC METABOLIC PANEL
BUN: 18 mg/dL (ref 6–23)
CO2: 27 mEq/L (ref 19–32)
Calcium: 9.2 mg/dL (ref 8.4–10.5)
Chloride: 101 mEq/L (ref 96–112)
Creatinine, Ser: 0.8 mg/dL (ref 0.4–1.5)
GFR: 102.74 mL/min (ref >60.00)
GLUCOSE: 144 mg/dL — AB (ref 70–99)
POTASSIUM: 4.2 meq/L (ref 3.5–5.1)
Sodium: 139 mEq/L (ref 135–145)

## 2013-09-30 MED ORDER — TECHNETIUM TC 99M SESTAMIBI GENERIC - CARDIOLITE
33.0000 | Freq: Once | INTRAVENOUS | Status: AC | PRN
  Administered 2013-09-30: 33 via INTRAVENOUS

## 2013-09-30 MED ORDER — TECHNETIUM TC 99M SESTAMIBI GENERIC - CARDIOLITE
10.8000 | Freq: Once | INTRAVENOUS | Status: AC | PRN
  Administered 2013-09-30: 11 via INTRAVENOUS

## 2013-09-30 MED ORDER — REGADENOSON 0.4 MG/5ML IV SOLN
0.4000 mg | Freq: Once | INTRAVENOUS | Status: AC
  Administered 2013-09-30: 0.4 mg via INTRAVENOUS

## 2013-09-30 NOTE — Progress Notes (Signed)
[facility] S[facility] NUCLEAR MED [address] [place], [place] [address] [phone]    Cardiology Nuclear Med Study  Janeann ForehandKenneth G Stephenson is a 80 y.o. male     MRN : [id number]     DOB: [birth year]  Procedure Date: 09/30/2013  Nuclear Med Background Indication for Stress Test:  Evaluation for Ischemia History:  5/11 EF: 55-60%  Cardiac Risk Factors: Hypertension, Lipids and NIDDM  Symptoms:  Chest Pain and Palpitations   Nuclear Pre-Procedure Caffeine/Decaff Intake:  None NPO After: 9:00am   Lungs:  clear O2 Sat: 95% on room air. IV 0.9% NS with Angio Cath:  22g  IV Site: R Hand  IV Started by:  Cathlyn Parsonsynthia Hasspacher, RN  Chest Size (in):  42 Cup Size: n/a  Height: 6' (1.829 m)  Weight:  163 lb (73.936 kg)  BMI:  Body mass index is 22.1 kg/(m^2). Tech Comments:  Coreg taken at 0800 today    Nuclear Med Study 1 or 2 day study: 1 day  Stress Test Type:  Treadmill/Lexiscan  Reading MD: n/a  Order Authorizing Provider:  Villa Herbhomas Brackbill,MD  Resting Radionuclide: Technetium 2918m Sestamibi  Resting Radionuclide Dose: 11.0 mCi   Stress Radionuclide:  Technetium 2518m Sestamibi  Stress Radionuclide Dose: 33.0 mCi           Stress Protocol Rest HR: 56 Stress HR: 78  Rest BP: 142/80 Stress BP: 157/66  Exercise Time (min): n/a METS: n/a   Predicted Max HR: 138 bpm % Max HR: 56.52 bpm Rate Pressure Product: 6084   Dose of Adenosine (mg):  n/a Dose of Lexiscan: 0.4 mg  Dose of Atropine (mg): n/a Dose of Dobutamine: n/a mcg/kg/min (at max HR)  Stress Test Technologist: Milana NaSabrina Williams, EMT-P  Nuclear Technologist:  Doyne Keelonya Yount, CNMT     Rest Procedure:  Myocardial perfusion imaging was performed at rest 45 minutes following the intravenous administration of Technetium 418m Sestamibi. Rest ECG: NSR - Normal EKG  Stress Procedure:  The patient received IV Lexiscan 0.4 mg over 15-seconds.  Technetium 6918m Sestamibi injected at 30-seconds. The patient had sob and was lt. Headed  with the Lexiscan injection.  Quantitative spect images were obtained after a 45 minute delay. Stress ECG: No significant change from baseline ECG  QPS Raw Data Images:  Normal; no motion artifact; normal heart/lung ratio. Stress Images:  There is decreased uptake in the anterior wall. Rest Images:  There is decreased uptake in the apex. Subtraction (SDS):  Small, mostly fixed anteroapical defect. SDS 1, Extent 6%. Transient Ischemic Dilatation (Normal <1.22):  1.08 Lung/Heart Ratio (Normal <0.45):  0.29  Quantitative Gated Spect Images QGS EDV:  N/A ml QGS ESV:  N/A ml  Impression Exercise Capacity:  Lexiscan with low level exercise. BP Response:  Normal blood pressure response. Clinical Symptoms:  No significant symptoms noted. ECG Impression:  No significant ECG changes with Lexiscan. Comparison with Prior Nuclear Study: No previous nuclear study performed  Overall Impression:  Low risk stress nuclear study with small (6%), mostly fixed (SDS 1) distal anteroapical defect..  LV Ejection Fraction: Study not gated.  LV Wall Motion:  Non-gated  Chrystie NoseKenneth C. Sophiah Rolin, MD, [facility] CenterFACC Board Certified in Nuclear Cardiology Attending Cardiologist [facility]

## 2013-10-01 NOTE — Progress Notes (Signed)
Quick Note:  Please report to patient. The recent labs are stable. Continue same medication and careful diet. The kidney function is still normal. Potassium is normal. The blood sugar is higher. Continue to watch diet carefully. Continue present medications. ______

## 2013-10-04 ENCOUNTER — Telehealth: Payer: Self-pay | Admitting: *Deleted

## 2013-10-04 NOTE — Telephone Encounter (Signed)
Message copied by Burnell BlanksPRATT, Deneise Getty B on Fri Oct 04, 2013  8:35 AM ------      Message from: Cassell ClementBRACKBILL, THOMAS      Created: Tue Oct 01, 2013  7:46 AM       Please report to patient.  The recent labs are stable. Continue same medication and careful diet.  The kidney function is still normal.  Potassium is normal.  The blood sugar is higher.  Continue to watch diet carefully.  Continue present medications. ------

## 2013-10-04 NOTE — Telephone Encounter (Signed)
Advised patient of lab results and myoview results

## 2013-10-04 NOTE — Telephone Encounter (Signed)
Message copied by Burnell BlanksPRATT, Durga Saldarriaga B on Fri Oct 04, 2013  8:35 AM ------      Message from: Cassell ClementBRACKBILL, THOMAS      Created: Tue Oct 01, 2013  9:20 PM       Please report. Stress test was good. No ischemia. Very small apical scar.Continue same meds. ------

## 2013-12-03 ENCOUNTER — Encounter: Payer: Self-pay | Admitting: Cardiology

## 2013-12-18 ENCOUNTER — Other Ambulatory Visit: Payer: Self-pay | Admitting: Cardiology

## 2014-01-14 ENCOUNTER — Encounter: Payer: Self-pay | Admitting: Cardiology

## 2014-01-14 ENCOUNTER — Ambulatory Visit (INDEPENDENT_AMBULATORY_CARE_PROVIDER_SITE_OTHER): Payer: Medicare Other | Admitting: Cardiology

## 2014-01-14 VITALS — BP 140/74 | HR 51 | Ht 72.0 in | Wt 170.8 lb

## 2014-01-14 DIAGNOSIS — I351 Nonrheumatic aortic (valve) insufficiency: Secondary | ICD-10-CM

## 2014-01-14 DIAGNOSIS — I119 Hypertensive heart disease without heart failure: Secondary | ICD-10-CM

## 2014-01-14 DIAGNOSIS — E7800 Pure hypercholesterolemia, unspecified: Secondary | ICD-10-CM

## 2014-01-14 DIAGNOSIS — I4949 Other premature depolarization: Secondary | ICD-10-CM

## 2014-01-14 DIAGNOSIS — I519 Heart disease, unspecified: Secondary | ICD-10-CM

## 2014-01-14 DIAGNOSIS — I493 Ventricular premature depolarization: Secondary | ICD-10-CM

## 2014-01-14 DIAGNOSIS — I359 Nonrheumatic aortic valve disorder, unspecified: Secondary | ICD-10-CM

## 2014-01-14 NOTE — Progress Notes (Signed)
Shawn Stephenson Date of Birth:  July 06, 1929 [facility] [address] [place], [place]  [address] [phone]        Fax   [phone]   History of Present Illness:  This pleasant 80 year old gentleman is seen for a scheduled followup office visit. He has a past history of frequent PVCs and a past history of hypertensive cardiovascular disease and a history of diastolic dysfunction of the left ventricle. He also has hypercholesterolemia. He is a mild diabetic. . The patient had an EKG on 08/23/13 which showed normal sinus rhythm with left anterior hemiblock and occasional PACs.  The patient had a Myoview stress test on 10/01/13 which showed no ischemia and showed a very small apical scar.  The study was not gated because of PVCs and PACs. His last echocardiogram was 10/29/09 and showed left ventricular hypertrophy with normal systolic function and with grade 1 diastolic dysfunction. There was also mild mitral valve prolapse, mild aortic sclerosis, and mild elevation of pulmonary artery pressure to 44 mm mercury . Since last visit the patient has been feeling better.  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   Current Outpatient Prescriptions  Medication Sig Dispense Refill  . amLODipine (NORVASC) 10 MG tablet Take 10 mg by mouth daily.        Marland Kitchen aspirin 81 MG tablet Take 81 mg by mouth daily.        Marland Kitchen atorvastatin (LIPITOR) 10 MG tablet TAKE ONE-HALF TABLET BY MOUTH ONCE A DAY  15 tablet  6  . carvedilol (COREG) 12.5 MG tablet Take 1 tablet (12.5 mg total) by mouth 2 (two) times daily.  180 tablet  2  . Cholecalciferol (VITAMIN D) 2000 UNITS CAPS Take 1 capsule by mouth daily. Taking 5000 daily      . Cyanocobalamin (VITAMIN B 12 PO) Take by mouth daily.      . fexofenadine (ALLEGRA) 180 MG tablet Take 180 mg by mouth daily.        . furosemide (LASIX) 20 MG tablet Take 20 mg by  mouth daily as needed.      Marland Kitchen glipiZIDE (GLUCOTROL) 5 MG 24 hr tablet Take 5 mg by mouth daily.        . hydrochlorothiazide (HYDRODIURIL) 25 MG tablet Take 1 tablet (25 mg total) by mouth daily.  90 tablet  3  . MAGNESIUM PO Take by mouth daily.        . Multiple Vitamins-Minerals (OCUVITE PRESERVISION PO) Take 2 tablets by mouth daily.         No current facility-administered medications for this visit.    Allergies  Allergen Reactions  . Losartan     Makes him feel jittery  . Sulfa Antibiotics   . Vasotec Swelling    Patient Active Problem List   Diagnosis Date Noted  . Mild aortic insufficiency 01/15/2013  . Asymptomatic PVCs 06/01/2011  . Benign hypertensive heart disease without heart failure 11/24/2010  . Diabetes mellitus 11/24/2010  . Hypercholesterolemia 11/24/2010  . Diastolic dysfunction, left ventricle 11/24/2010    History  Smoking status  . Never Smoker   Smokeless tobacco  . Not on file    History  Alcohol Use No    Family History  Problem Relation Age of Onset  . Stroke Father   . Diabetes Father   . Hypertension Mother   . Hypertension Father   .  Arthritis Mother   . Hypertension Brother   . Stroke Brother   . Kidney disease Brother     Review of Systems: Constitutional: no fever chills diaphoresis or fatigue or change in weight.  Head and neck: no hearing loss, no epistaxis, no photophobia or visual disturbance. Respiratory: No cough, shortness of breath or wheezing. Cardiovascular: No chest pain peripheral edema, palpitations. Gastrointestinal: No abdominal distention, no abdominal pain, no change in bowel habits hematochezia or melena. Genitourinary: No dysuria, no frequency, no urgency, no nocturia. Musculoskeletal:No arthralgias, no back pain, no gait disturbance or myalgias. Neurological: No dizziness, no headaches, no numbness, no seizures, no syncope, no weakness, no tremors. Hematologic: No lymphadenopathy, no easy  bruising. Psychiatric: No confusion, no hallucinations, no sleep disturbance.    Physical Exam: Filed Vitals:   01/14/14 1047  BP: 140/74  Pulse: 51   general appearance reveals a well-developed well-nourished gentleman in no distress.The head and neck exam reveals pupils equal and reactive.  Extraocular movements are full.  There is no scleral icterus.  The mouth and pharynx are normal.  The neck is supple.  The carotids reveal no bruits.  The jugular venous pressure is normal.  The  thyroid is not enlarged.  There is no lymphadenopathy.  The chest is clear to percussion and auscultation.  There are no rales or rhonchi.  Expansion of the chest is symmetrical.  The precordium is quiet.  The first heart sound is normal.  The second heart sound is physiologically split.  There is grade 2/6 systolic ejection murmur at the base. There is no abnormal lift or heave.  The abdomen is soft and nontender.  The bowel sounds are normal.  The liver and spleen are not enlarged.  There are no abdominal masses.  There are no abdominal bruits.  Extremities reveal good pedal pulses.  There is no phlebitis or edema.  There is no cyanosis or clubbing.  Strength is normal and symmetrical in all extremities.  There is no lateralizing weakness.  There are no sensory deficits.  The skin is warm and dry.  There is no rash.  EKG today shows marked sinus bradycardia with sinus arrhythmia and left anterior hemiblock and no ischemic changes.   Assessment / Plan: 1. hypertensive heart disease without heart failure 2. Diastolic dysfunction 3. left anterior hemiblock with frequent PVCs 4. aortic valve disease with mild aortic insufficiency  Plan: Continue same medication.  Return for a 2-D echo to evaluate his aortic valve at his LV function.  Check in 4 months for office visit.

## 2014-01-14 NOTE — Assessment & Plan Note (Signed)
The patient has a known heart murmur.  His last echocardiogram showed mild aortic insufficiency.  We will have him return for updated echocardiogram to assess his LV function and his aortic valve status.

## 2014-01-14 NOTE — Assessment & Plan Note (Signed)
The patient has been having fewer PVCs. Overall he has felt better on his current regimen which includes daily HCTZ

## 2014-01-14 NOTE — Patient Instructions (Signed)
Your physician has requested that you have an echocardiogram. Echocardiography is a painless test that uses sound waves to create images of your heart. It provides your doctor with information about the size and shape of your heart and how well your heart's chambers and valves are working. This procedure takes approximately one hour. There are no restrictions for this procedure. NEEDS TO BE SCHEDULED IN 1 MONTH  Your physician recommends that you continue on your current medications as directed. Please refer to the Current Medication list given to you today.  Your physician recommends that you schedule a follow-up appointment in: 4 month ov

## 2014-01-14 NOTE — Assessment & Plan Note (Signed)
Blood pressure has been remaining stable on current medication.  The patient has not been having chest pain or shortness of breath dizziness or syncope or palpitations.

## 2014-02-10 ENCOUNTER — Ambulatory Visit (HOSPITAL_COMMUNITY): Payer: Medicare Other | Attending: Cardiology | Admitting: Cardiology

## 2014-02-10 DIAGNOSIS — I519 Heart disease, unspecified: Secondary | ICD-10-CM

## 2014-02-10 DIAGNOSIS — I359 Nonrheumatic aortic valve disorder, unspecified: Secondary | ICD-10-CM | POA: Diagnosis not present

## 2014-02-10 DIAGNOSIS — I351 Nonrheumatic aortic (valve) insufficiency: Secondary | ICD-10-CM

## 2014-02-10 NOTE — Progress Notes (Signed)
Echo performed. 

## 2014-05-19 ENCOUNTER — Encounter: Payer: Self-pay | Admitting: Cardiology

## 2014-05-19 ENCOUNTER — Ambulatory Visit (INDEPENDENT_AMBULATORY_CARE_PROVIDER_SITE_OTHER): Payer: Medicare Other | Admitting: Cardiology

## 2014-05-19 VITALS — BP 136/72 | HR 52 | Ht 72.0 in | Wt 166.0 lb

## 2014-05-19 DIAGNOSIS — E7800 Pure hypercholesterolemia, unspecified: Secondary | ICD-10-CM

## 2014-05-19 DIAGNOSIS — I119 Hypertensive heart disease without heart failure: Secondary | ICD-10-CM

## 2014-05-19 DIAGNOSIS — I422 Other hypertrophic cardiomyopathy: Secondary | ICD-10-CM

## 2014-05-19 DIAGNOSIS — I517 Cardiomegaly: Secondary | ICD-10-CM | POA: Insufficient documentation

## 2014-05-19 NOTE — Patient Instructions (Signed)
Your physician recommends that you continue on your current medications as directed. Please refer to the Current Medication list given to you today.  Your physician wants you to follow-up in: 6 month ov/ekg You will receive a reminder letter in the mail two months in advance. If you don't receive a letter, please call our office to schedule the follow-up appointment.  

## 2014-05-19 NOTE — Assessment & Plan Note (Signed)
His blood pressure was remaining normal on current therapy.  He is not having any dizziness or syncope.  No chest pain or shortness of breath.  He gets his exercise by doing yard work.

## 2014-05-19 NOTE — Progress Notes (Signed)
Shawn Stephenson Date of Birth:  1929-07-21 [facility] [address] [place], [place]  [address] [phone]        Fax   [phone]   History of Present Illness:  This pleasant 80 year old gentleman is seen for a scheduled followup office visit. He has a past history of frequent PVCs and a past history of hypertensive cardiovascular disease and a history of diastolic dysfunction of the left ventricle. He also has hypercholesterolemia. He is a mild diabetic. . The patient had an EKG on 08/23/13 which showed normal sinus rhythm with left anterior hemiblock and occasional PACs.  The patient had a Myoview stress test on 10/01/13 which showed no ischemia and showed a very small apical scar.  The study was not gated because of PVCs and PACs. His last echocardiogram was 02/10/14 and showed normal left ventricular systolic function with ejection fraction of 65%.  There was aortic valve sclerosis without stenosis.. Since last visit the patient has been feeling better.  He mentioned that his daughter who is in her 32s has been diagnosed with HOCM and is considering surgery at the [facility] clinic.   Current Outpatient Prescriptions  Medication Sig Dispense Refill  . ACCU-CHEK AVIVA PLUS test strip     . ACCU-CHEK SOFTCLIX LANCETS lancets     . amLODipine (NORVASC) 10 MG tablet Take 10 mg by mouth daily.      Marland Kitchen aspirin 81 MG tablet Take 81 mg by mouth daily.      Marland Kitchen atorvastatin (LIPITOR) 10 MG tablet Take 10 mg by mouth as directed. 1/2 tablet daily    . Blood Glucose Monitoring Suppl (ACCU-CHEK AVIVA PLUS) W/DEVICE KIT     . carvedilol (COREG) 12.5 MG tablet Take 1 tablet (12.5 mg total) by mouth 2 (two) times daily. 180 tablet 2  . Cholecalciferol (VITAMIN D) 2000 UNITS CAPS Take 1 capsule by mouth daily. Taking 5000 daily    . Cyanocobalamin (VITAMIN B 12 PO) Take 2,500 mg by mouth daily.     . fexofenadine (ALLEGRA) 180 MG tablet Take 180 mg by mouth daily.      .  furosemide (LASIX) 20 MG tablet Take 20 mg by mouth daily as needed.    Marland Kitchen glipiZIDE (GLUCOTROL) 5 MG 24 hr tablet Take 5 mg by mouth daily.      Marland Kitchen glucosamine-chondroitin 500-400 MG tablet Take 1 tablet by mouth 3 (three) times daily.    . hydrochlorothiazide (HYDRODIURIL) 25 MG tablet Take 1 tablet (25 mg total) by mouth daily. 90 tablet 3  . MAGNESIUM PO Take 400 mg by mouth daily.     . Multiple Vitamins-Minerals (OCUVITE PRESERVISION PO) Take 2 tablets by mouth daily.      Marland Kitchen pyridOXINE (VITAMIN B-6) 100 MG tablet Take 100 mg by mouth daily.     No current facility-administered medications for this visit.    Allergies  Allergen Reactions  . Losartan     Makes him feel jittery  . Sulfa Antibiotics   . Vasotec Swelling    Patient Active Problem List   Diagnosis Date Noted  . Asymmetric septal hypertrophy 05/19/2014  . Mild aortic insufficiency 01/15/2013  . Asymptomatic PVCs 06/01/2011  . Benign hypertensive heart disease without heart failure 11/24/2010  . Diabetes mellitus 11/24/2010  . Hypercholesterolemia 11/24/2010  . Diastolic dysfunction, left ventricle 11/24/2010    History  Smoking status  . Never Smoker   Smokeless tobacco  . Not on file  History  Alcohol Use No    Family History  Problem Relation Age of Onset  . Stroke Father   . Diabetes Father   . Hypertension Mother   . Hypertension Father   . Arthritis Mother   . Hypertension Brother   . Stroke Brother   . Kidney disease Brother     Review of Systems: Constitutional: no fever chills diaphoresis or fatigue or change in weight.  Head and neck: no hearing loss, no epistaxis, no photophobia or visual disturbance. Respiratory: No cough, shortness of breath or wheezing. Cardiovascular: No chest pain peripheral edema, palpitations. Gastrointestinal: No abdominal distention, no abdominal pain, no change in bowel habits hematochezia or melena. Genitourinary: No dysuria, no frequency, no urgency, no  nocturia. Musculoskeletal:No arthralgias, no back pain, no gait disturbance or myalgias. Neurological: No dizziness, no headaches, no numbness, no seizures, no syncope, no weakness, no tremors. Hematologic: No lymphadenopathy, no easy bruising. Psychiatric: No confusion, no hallucinations, no sleep disturbance.    Physical Exam: Filed Vitals:   05/19/14 1325  BP: 136/72  Pulse: 52   general appearance reveals a well-developed well-nourished gentleman in no distress.The head and neck exam reveals pupils equal and reactive.  Extraocular movements are full.  There is no scleral icterus.  The mouth and pharynx are normal.  The neck is supple.  The carotids reveal no bruits.  The jugular venous pressure is normal.  The  thyroid is not enlarged.  There is no lymphadenopathy.  The chest is clear to percussion and auscultation.  There are no rales or rhonchi.  Expansion of the chest is symmetrical.  The precordium is quiet.  The first heart sound is normal.  The second heart sound is physiologically split.  There is grade 2/6 systolic ejection murmur at the base. There is no abnormal lift or heave.  The abdomen is soft and nontender.  The bowel sounds are normal.  The liver and spleen are not enlarged.  There are no abdominal masses.  There are no abdominal bruits.  Extremities reveal good pedal pulses.  There is no phlebitis or edema.  There is no cyanosis or clubbing.  Strength is normal and symmetrical in all extremities.  There is no lateralizing weakness.  There are no sensory deficits.  The skin is warm and dry.  There is no rash.     Assessment / Plan: 1. hypertensive heart disease without heart failure 2. Diastolic dysfunction 3. left anterior hemiblock with frequent PVCs 4. aortic valve disease with mild aortic insufficiency 5.  Asymmetric septal hypertrophy.  His daughter has HOCM.  Plan: Continue same medication.  Return 6 months for office visit and EKG.  His lipids are followed by his  PCP

## 2014-05-19 NOTE — Assessment & Plan Note (Signed)
The patient has a known systolic ejection murmur at the base.  We'll update his echocardiographic results more closely.  This in view of his daughter's diagnosis of HOCM.  Shawn Stephenson's septal thickness is 17 and his posterior wall thicknesses 11 which yields a ratio of 1.5.  This would be consistent with asymmetric septal hypertrophy ( ASH).  The patient is not having any dizziness or syncope

## 2014-05-19 NOTE — Assessment & Plan Note (Signed)
The patient has bought a blood sugar meter and is now checking his sugars.  He is not having any hypoglycemic episodes.  His weight has been stable.

## 2014-05-19 NOTE — Assessment & Plan Note (Signed)
The patient continues on half of a 10 mg Lipitor daily.  He is not having any myalgias.

## 2014-07-17 ENCOUNTER — Telehealth: Payer: Self-pay | Admitting: Cardiology

## 2014-07-17 NOTE — Telephone Encounter (Signed)
Walk in pt form " papers dropped off" [facility]

## 2014-07-30 ENCOUNTER — Telehealth: Payer: Self-pay | Admitting: *Deleted

## 2014-07-30 NOTE — Telephone Encounter (Signed)
Patient brought in information on VitaPulse for  Dr. Patty SermonsBrackbill to review  Dr. Patty SermonsBrackbill reviewed and ok for patient to try Advised patient

## 2014-07-31 ENCOUNTER — Other Ambulatory Visit: Payer: Self-pay | Admitting: Cardiology

## 2014-08-06 ENCOUNTER — Other Ambulatory Visit: Payer: Self-pay | Admitting: Cardiology

## 2014-11-06 ENCOUNTER — Other Ambulatory Visit: Payer: Self-pay | Admitting: Cardiology

## 2014-11-18 ENCOUNTER — Other Ambulatory Visit: Payer: Self-pay | Admitting: Cardiology

## 2014-11-18 ENCOUNTER — Ambulatory Visit (INDEPENDENT_AMBULATORY_CARE_PROVIDER_SITE_OTHER): Payer: Medicare Other | Admitting: Cardiology

## 2014-11-18 ENCOUNTER — Encounter: Payer: Self-pay | Admitting: Cardiology

## 2014-11-18 VITALS — BP 124/62 | HR 61 | Ht 72.0 in | Wt 164.4 lb

## 2014-11-18 DIAGNOSIS — I493 Ventricular premature depolarization: Secondary | ICD-10-CM

## 2014-11-18 DIAGNOSIS — I422 Other hypertrophic cardiomyopathy: Secondary | ICD-10-CM

## 2014-11-18 DIAGNOSIS — I119 Hypertensive heart disease without heart failure: Secondary | ICD-10-CM

## 2014-11-18 DIAGNOSIS — I517 Cardiomegaly: Secondary | ICD-10-CM

## 2014-11-18 MED ORDER — AMLODIPINE BESYLATE 10 MG PO TABS: 10.0000 mg | ORAL_TABLET | Freq: Every day | ORAL | Status: DC

## 2014-11-18 MED ORDER — ATORVASTATIN CALCIUM 10 MG PO TABS: ORAL_TABLET | ORAL | Status: DC

## 2014-11-18 NOTE — Telephone Encounter (Signed)
Ordered Medications       Disp Refills Start End    amLODipine (NORVASC) 10 MG tablet 90 tablet 3 11/18/2014     Take 1 tablet (10 mg total) by mouth daily. - Oral    atorvastatin (LIPITOR) 10 MG tablet 45 tablet 3 11/18/2014     TAKE ONE-HALF TABLET BY MOUTH ONCE A DAY

## 2014-11-18 NOTE — Patient Instructions (Signed)
Medication Instructions:  Your physician recommends that you continue on your current medications as directed. Please refer to the Current Medication list given to you today.  Labwork: none  Testing/Procedures: none  Follow-Up: Your physician wants you to follow-up in: 6 month ov You will receive a reminder letter in the mail two months in advance. If you don't receive a letter, please call our office to schedule the follow-up appointment.    

## 2014-11-18 NOTE — Progress Notes (Signed)
Cardiology Office Note   Date:  11/18/2014   ID:  NISHANTH MCCAUGHAN, DOB January 16, 1930, MRN [id number]  PCP:  Asencion Noble, MD  Cardiologist: Darlin Coco MD  No chief complaint on file.     History of Present Illness: Shawn Stephenson is a 81 y.o. male who presents for followup OV  This pleasant 81 year old gentleman is seen for a scheduled followup office visit. He has a past history of frequent PVCs and a past history of hypertensive cardiovascular disease and a history of diastolic dysfunction of the left ventricle. He also has hypercholesterolemia. He is a mild diabetic. . The patient had an EKG on 08/23/13 which showed normal sinus rhythm with left anterior hemiblock and occasional PACs. The patient had a Myoview stress test on 10/01/13 which showed no ischemia and showed a very small apical scar. The study was not gated because of PVCs and PACs. His last echocardiogram was 02/10/14 and showed normal left ventricular systolic function with ejection fraction of 65%. There was aortic valve sclerosis without stenosis.. Since last visit the patient has been feeling better.  Since last visit, his daughter had septal ablation therapy for hypertrophic obstructive cardiomyopathy done at [facility].  She had a excellent result and did well.  Past Medical History  Diagnosis Date  . Hypertension   . Hyperlipidemia   . Mitral regurgitation     mild  . Diastolic dysfunction   . Diabetes mellitus     well controlled  . Asymptomatic PVCs     No past surgical history on file.   Current Outpatient Prescriptions  Medication Sig Dispense Refill  . ACCU-CHEK AVIVA PLUS test strip     . ACCU-CHEK SOFTCLIX LANCETS lancets     . amLODipine (NORVASC) 10 MG tablet Take 1 tablet (10 mg total) by mouth daily. 90 tablet 3  . aspirin 81 MG tablet Take 81 mg by mouth daily.      Marland Kitchen atorvastatin (LIPITOR) 10 MG tablet TAKE ONE-HALF TABLET BY MOUTH ONCE A DAY 45 tablet 3  . Blood Glucose  Monitoring Suppl (ACCU-CHEK AVIVA PLUS) W/DEVICE KIT     . carvedilol (COREG) 12.5 MG tablet TAKE ONE TABLET BY MOUTH TWICE A DAY 180 tablet 0  . Cholecalciferol (VITAMIN D) 2000 UNITS CAPS Take 1 capsule by mouth daily. Taking 5000 daily    . Cholecalciferol (VITAMIN D-3) 1000 UNITS CAPS Take 1,000 Units by mouth daily.    . Cyanocobalamin (VITAMIN B 12 PO) Take 1,000 mcg by mouth daily.     Marland Kitchen glipiZIDE (GLUCOTROL) 5 MG 24 hr tablet Take 5 mg by mouth daily.      Marland Kitchen glucosamine-chondroitin 500-400 MG tablet Take 1 tablet by mouth 3 (three) times daily.    . hydrochlorothiazide (HYDRODIURIL) 25 MG tablet Take 1 tablet (25 mg total) by mouth daily. 90 tablet 3  . MAGNESIUM PO Take 400 mg by mouth daily.     . Multiple Vitamins-Minerals (OCUVITE PRESERVISION PO) Take 2 tablets by mouth daily.      Marland Kitchen OVER THE COUNTER MEDICATION daily. VitaPulse    . pyridOXINE (VITAMIN B-6) 50 MG tablet Take 50 mg by mouth daily.     No current facility-administered medications for this visit.    Allergies:   Losartan; Sulfa antibiotics; and Vasotec    Social History:  The patient  reports that he has never smoked. He does not have any smokeless tobacco history on file. He reports that he does not drink alcohol or  use illicit drugs.   Family History:  The patient's family history includes Arthritis in his mother; Diabetes in his father; Hypertension in his brother, father, and mother; Kidney disease in his brother; Stroke in his brother and father.    ROS:  Please see the history of present illness.   Otherwise, review of systems are positive for none.   All other systems are reviewed and negative.    PHYSICAL EXAM: VS:  BP 124/62 mmHg  Pulse 61  Ht 6' (1.829 m)  Wt 164 lb 6.4 oz (74.571 kg)  BMI 22.29 kg/m2 , BMI Body mass index is 22.29 kg/(m^2). GEN: Well nourished, well developed, in no acute distress HEENT: normal Neck: no JVD, carotid bruits, or masses Cardiac: RRR; soft systolic murmur at base.   Irregular pulse with PVCs Respiratory:  clear to auscultation bilaterally, normal work of breathing GI: soft, nontender, nondistended, + BS MS: no deformity or atrophy Skin: warm and dry, no rash Neuro:  Strength and sensation are intact Psych: euthymic mood, full affect   EKG:  EKG is ordered today. The ekg ordered today demonstrates normal sinus rhythm with frequent PVCs and with PACs.  Incomplete right bundle branch block with left anterior fascicular block.  LVH with repolarization abnormality.   Recent Labs: No results found for requested labs within last 365 days.    Lipid Panel No results found for: CHOL, TRIG, HDL, CHOLHDL, VLDL, LDLCALC, LDLDIRECT    Wt Readings from Last 3 Encounters:  11/18/14 164 lb 6.4 oz (74.571 kg)  05/19/14 166 lb (75.297 kg)  01/14/14 170 lb 12.8 oz (77.474 kg)         ASSESSMENT AND PLAN:  1. hypertensive heart disease without heart failure 2. Diastolic dysfunction 3. left anterior hemiblock with frequent PVCs 4. aortic valve disease with mild aortic insufficiency 5. LVH.   Plan: Continue same medication. Return 6 months for office visit .    Current medicines are reviewed at length with the patient today.  The patient does not have concerns regarding medicines.  The following changes have been made:  no change  Labs/ tests ordered today include:   Orders Placed This Encounter  Procedures  . EKG 12-Lead     Disposition: Continue current medication.  Recheck in 6 months for office visit.  Berna Spare MD 11/18/2014 4:59 PM    [facility] [place], [place], [place]  [address] Phone: [phone]; Fax: [phone]

## 2015-02-09 ENCOUNTER — Other Ambulatory Visit: Payer: Self-pay | Admitting: Cardiology

## 2015-05-04 ENCOUNTER — Encounter: Payer: Self-pay | Admitting: Cardiology

## 2015-05-06 ENCOUNTER — Other Ambulatory Visit: Payer: Self-pay | Admitting: Cardiology

## 2015-05-21 ENCOUNTER — Ambulatory Visit: Payer: Medicare Other | Admitting: Cardiology

## 2015-07-17 ENCOUNTER — Ambulatory Visit (INDEPENDENT_AMBULATORY_CARE_PROVIDER_SITE_OTHER): Payer: Medicare Other | Admitting: Cardiology

## 2015-07-17 ENCOUNTER — Encounter: Payer: Self-pay | Admitting: Cardiology

## 2015-07-17 VITALS — BP 142/70 | HR 50 | Ht 71.0 in | Wt 170.1 lb

## 2015-07-17 DIAGNOSIS — I517 Cardiomegaly: Secondary | ICD-10-CM | POA: Diagnosis not present

## 2015-07-17 DIAGNOSIS — I119 Hypertensive heart disease without heart failure: Secondary | ICD-10-CM

## 2015-07-17 DIAGNOSIS — I351 Nonrheumatic aortic (valve) insufficiency: Secondary | ICD-10-CM

## 2015-07-17 DIAGNOSIS — I493 Ventricular premature depolarization: Secondary | ICD-10-CM

## 2015-07-17 DIAGNOSIS — I519 Heart disease, unspecified: Secondary | ICD-10-CM

## 2015-07-17 NOTE — Patient Instructions (Signed)
Medication Instructions:   Your physician recommends that you continue on your current medications as directed. Please refer to the Current Medication list given to you today.    If you need a refill on your cardiac medications before your next appointment, please call your pharmacy.  Labwork: NONE ORDER TODAY  Testing/Procedures: NONE ORDER TODAY    Follow-Up:  IN 6 MONTHS WITH DR Darl Householder IN [place]    Any Other Special Instructions Will Be Listed Below (If Applicable).  MONITOR YOUR DIET TO WATCH WEIGHT GAIN  CHECK BLOOD PRESSURE AT HOME AND KEEP A  RECORD OF IT

## 2015-07-17 NOTE — Progress Notes (Signed)
Cardiology Office Note   Date:  07/17/2015   ID:  Shawn Stephenson, DOB Sep 10, 1929, MRN [id number]  PCP:  Asencion Noble, MD  Cardiologist: Darlin Coco MD  Chief Complaint  Patient presents with  . scheduled follow up    benign hypetensive disease without heart failure. Denies cp/sob/le edema, or claudication since last visit      History of Present Illness: Shawn Stephenson is a 82 y.o. male who presents for a six-month follow-up visit  He has a past history of frequent PVCs and a past history of hypertensive cardiovascular disease and a history of diastolic dysfunction of the left ventricle. He also has hypercholesterolemia. He is a mild diabetic. . The patient had an EKG on 08/23/13 which showed normal sinus rhythm with left anterior hemiblock and occasional PACs. The patient had a Myoview stress test on 10/01/13 which showed no ischemia and showed a very small apical scar. The study was not gated because of PVCs and PACs. His last echocardiogram was 02/10/14 and showed normal left ventricular systolic function with ejection fraction of 65%. There was aortic valve sclerosis without stenosis.. Since last visit the patient has been feeling better.  Since last visit, his daughter had septal ablation therapy for hypertrophic obstructive cardiomyopathy done at [facility] clinic. She had a excellent result and did well. The patient has not been getting as much exercise.  His weight is up 6 pounds since last visit.  His blood pressure is also higher today.  He has not been checking his blood pressure at home recently although he has the equipment to do so. He brought in copies of his recent lab work from Dr. Glean Salen which showed an LDL of 73 and a blood sugar of 103 taken in July.  Past Medical History  Diagnosis Date  . Hypertension   . Hyperlipidemia   . Mitral regurgitation     mild  . Diastolic dysfunction   . Diabetes mellitus     well controlled  . Asymptomatic PVCs      No past surgical history on file.   Current Outpatient Prescriptions  Medication Sig Dispense Refill  . ACCU-CHEK AVIVA PLUS test strip     . ACCU-CHEK SOFTCLIX LANCETS lancets     . amLODipine (NORVASC) 10 MG tablet Take 1 tablet (10 mg total) by mouth daily. 90 tablet 3  . aspirin 81 MG tablet Take 81 mg by mouth daily.      Marland Kitchen atorvastatin (LIPITOR) 10 MG tablet TAKE ONE-HALF TABLET BY MOUTH ONCE A DAY 45 tablet 3  . Blood Glucose Monitoring Suppl (ACCU-CHEK AVIVA PLUS) W/DEVICE KIT     . carvedilol (COREG) 12.5 MG tablet Take 12.5 mg by mouth 2 (two) times daily with a meal.    . Cholecalciferol (VITAMIN D-3) 1000 UNITS CAPS Take 1,000 Units by mouth daily.    . Cyanocobalamin (VITAMIN B 12 PO) Take 1,000 mcg by mouth daily.     . fexofenadine (ALLEGRA) 180 MG tablet Take 180 mg by mouth daily.    Marland Kitchen glipiZIDE (GLUCOTROL) 5 MG 24 hr tablet Take 5 mg by mouth daily.      Marland Kitchen glucosamine-chondroitin 500-400 MG tablet Take 1 tablet by mouth 3 (three) times daily.    . hydrochlorothiazide (HYDRODIURIL) 25 MG tablet Take 1 tablet (25 mg total) by mouth daily. 90 tablet 3  . MAGNESIUM PO Take 400 mg by mouth daily.     . Multiple Vitamins-Minerals (OCUVITE PRESERVISION PO) Take 1 tablet  by mouth daily.    Marland Kitchen OVER THE COUNTER MEDICATION Take 2 capsules by mouth daily. Co Q 10 + NAC    . pyridOXINE (VITAMIN B-6) 50 MG tablet Take 50 mg by mouth daily.     No current facility-administered medications for this visit.    Allergies:   Enalapril maleate; Losartan; Menthol; Sulfa antibiotics; Sulfamethoxazole; and Vasotec    Social History:  The patient  reports that he has never smoked. He does not have any smokeless tobacco history on file. He reports that he does not drink alcohol or use illicit drugs.   Family History:  The patient's family history includes Arthritis in his mother; Diabetes in his father; Hypertension in his brother, father, and mother; Kidney disease in his brother; Stroke  in his brother and father.    ROS:  Please see the history of present illness.   Otherwise, review of systems are positive for none.   All other systems are reviewed and negative.    PHYSICAL EXAM: VS:  BP 142/70 mmHg  Pulse 50  Ht _0  (1.803 m)  Wt 170 lb 1.9 oz (77.166 kg)  BMI 23.74 kg/m2 , BMI Body mass index is 23.74 kg/(m^2). GEN: Well nourished, well developed, in no acute distress HEENT: normal Neck: no JVD, carotid bruits, or masses Cardiac: RRR; no murmurs, rubs, or gallops,no edema  Respiratory:  clear to auscultation bilaterally, normal work of breathing GI: soft, nontender, nondistended, + BS MS: no deformity or atrophy Skin: warm and dry, no rash Neuro:  Strength and sensation are intact Psych: euthymic mood, full affect   EKG:  EKG is not ordered today.    Recent Labs: No results found for requested labs within last 365 days.    Lipid Panel No results found for: CHOL, TRIG, HDL, CHOLHDL, VLDL, LDLCALC, LDLDIRECT    Wt Readings from Last 3 Encounters:  07/17/15 170 lb 1.9 oz (77.166 kg)  11/18/14 164 lb 6.4 oz (74.571 kg)  05/19/14 166 lb (75.297 kg)        ASSESSMENT AND PLAN:  1. hypertensive heart disease without heart failure 2. Diastolic dysfunction 3. left anterior hemiblock with frequent PVCs 4. aortic valve disease with mild aortic insufficiency 5. LVH.    Current medicines are reviewed at length with the patient today.  The patient does not have concerns regarding medicines.  The following changes have been made:  no change  Labs/ tests ordered today include:  No orders of the defined types were placed in this encounter.     Disposition:   The patient is to continue current medication.  He needs to work harder on weight loss and exercise and monitoring his blood pressure at home.  He will return in 6 months to see Dr. Jacinta Shoe at the [place] office after my retirement.  Berna Spare MD 07/17/2015 3:28 PM      [facility] [place], [place], [place]  [address] Phone: [phone]; Fax: [phone]

## 2015-10-27 ENCOUNTER — Ambulatory Visit (INDEPENDENT_AMBULATORY_CARE_PROVIDER_SITE_OTHER): Payer: Medicare Other | Admitting: Urology

## 2015-10-27 DIAGNOSIS — N400 Enlarged prostate without lower urinary tract symptoms: Secondary | ICD-10-CM | POA: Diagnosis not present

## 2015-10-27 DIAGNOSIS — Z87442 Personal history of urinary calculi: Secondary | ICD-10-CM | POA: Diagnosis not present

## 2015-11-03 ENCOUNTER — Other Ambulatory Visit: Payer: Self-pay

## 2015-11-03 MED ORDER — ATORVASTATIN CALCIUM 10 MG PO TABS: 5.0000 mg | ORAL_TABLET | Freq: Every day | ORAL | Status: DC

## 2015-11-03 NOTE — Telephone Encounter (Signed)
Rx(s) sent to pharmacy electronically.  

## 2016-01-05 ENCOUNTER — Encounter: Payer: Self-pay | Admitting: Cardiology

## 2016-01-19 ENCOUNTER — Ambulatory Visit (INDEPENDENT_AMBULATORY_CARE_PROVIDER_SITE_OTHER): Payer: Medicare Other | Admitting: Urology

## 2016-01-19 DIAGNOSIS — N400 Enlarged prostate without lower urinary tract symptoms: Secondary | ICD-10-CM

## 2016-01-19 DIAGNOSIS — R351 Nocturia: Secondary | ICD-10-CM | POA: Diagnosis not present

## 2016-01-25 ENCOUNTER — Encounter: Payer: Self-pay | Admitting: *Deleted

## 2016-02-08 ENCOUNTER — Encounter (INDEPENDENT_AMBULATORY_CARE_PROVIDER_SITE_OTHER): Payer: Self-pay

## 2016-02-08 ENCOUNTER — Ambulatory Visit (INDEPENDENT_AMBULATORY_CARE_PROVIDER_SITE_OTHER): Payer: Medicare Other | Admitting: Cardiology

## 2016-02-08 ENCOUNTER — Encounter: Payer: Self-pay | Admitting: Cardiology

## 2016-02-08 VITALS — BP 142/70 | HR 48 | Ht 71.0 in | Wt 167.8 lb

## 2016-02-08 DIAGNOSIS — I119 Hypertensive heart disease without heart failure: Secondary | ICD-10-CM | POA: Diagnosis not present

## 2016-02-08 DIAGNOSIS — I519 Heart disease, unspecified: Secondary | ICD-10-CM

## 2016-02-08 DIAGNOSIS — E7800 Pure hypercholesterolemia, unspecified: Secondary | ICD-10-CM | POA: Diagnosis not present

## 2016-02-08 NOTE — Progress Notes (Signed)
Cardiology Office Note   Date:  02/08/2016   ID:  Shawn Stephenson, DOB Sep 09, 1929, MRN [id number]  PCP:  Asencion Noble, MD  Cardiologist: Zeppelin Beckstrand [place] MD  Chief Complaint  Patient presents with  . Hypertension      History of Present Illness: Shawn Stephenson is a 82 y.o. male who presents for follow up PVCs and hypertensive heart disease. He is a former patient of Dr. Mare Ferrari.  He has a past history of frequent PVCs and a past history of hypertensive cardiovascular disease and a history of diastolic dysfunction of the left ventricle. He also has hypercholesterolemia. He is a mild diabetic. . The patient had a Myoview stress test on 10/01/13 which showed no ischemia and showed a very small apical scar. The study was not gated because of PVCs and PACs. His last echocardiogram was 02/10/14 and showed normal left ventricular systolic function with ejection fraction of 65%. There was aortic valve sclerosis without stenosis.Marland Kitchen  His  Daughter Tonna Boehringer is a patient of mine and had septal ablation therapy for hypertrophic obstructive cardiomyopathy done at [facility] clinic. She had a excellent result and did well.  The patient has not been getting as much exercise.  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 Denies any chest pain or SOB. Has infrequent palpitations.   Past Medical History:  Diagnosis Date  . Asymptomatic PVCs   . Diabetes mellitus    well controlled  . Diastolic dysfunction   . Hyperlipidemia   . Hypertension   . Mitral regurgitation    mild    History reviewed. No pertinent surgical history.   Current Outpatient Prescriptions  Medication Sig Dispense Refill  . ACCU-CHEK AVIVA PLUS test strip     . ACCU-CHEK SOFTCLIX LANCETS lancets     . amLODipine (NORVASC) 10 MG tablet Take 1 tablet (10 mg total) by mouth daily. 90 tablet 3  . aspirin 81 MG tablet Take 81 mg by mouth daily.      Marland Kitchen atorvastatin (LIPITOR) 10 MG tablet Take  0.5 tablets (5 mg total) by mouth daily. 45 tablet 2  . Blood Glucose Monitoring Suppl (ACCU-CHEK AVIVA PLUS) W/DEVICE KIT     . carvedilol (COREG) 12.5 MG tablet Take 12.5 mg by mouth 2 (two) times daily with a meal.    . Cholecalciferol (VITAMIN D-3) 1000 UNITS CAPS Take 1,000 Units by mouth daily.    . Cyanocobalamin (VITAMIN B 12 PO) Take 1,000 mcg by mouth daily.     . fexofenadine (ALLEGRA) 180 MG tablet Take 180 mg by mouth daily.    Marland Kitchen glipiZIDE (GLUCOTROL) 5 MG 24 hr tablet Take 5 mg by mouth daily.      Marland Kitchen glucosamine-chondroitin 500-400 MG tablet Take 1 tablet by mouth 3 (three) times daily.    . hydrochlorothiazide (HYDRODIURIL) 25 MG tablet Take 1 tablet (25 mg total) by mouth daily. 90 tablet 3  . MAGNESIUM PO Take 400 mg by mouth daily.     . Multiple Vitamins-Minerals (OCUVITE PRESERVISION PO) Take 1 tablet by mouth daily.    Marland Kitchen OVER THE COUNTER MEDICATION Take 2 capsules by mouth daily. Co Q 10 + NAC    . OVER THE COUNTER MEDICATION Take 1 tablet by mouth 2 (two) times daily. Med Name: ANTIIVA    . pyridOXINE (VITAMIN B-6) 50 MG tablet Take 50 mg by mouth daily.     No current facility-administered medications for this visit.  Allergies:   Enalapril maleate; Losartan; Menthol; Vasotec; Sulfa antibiotics; and Sulfamethoxazole    Social History:  The patient  reports that he has never smoked. He has never used smokeless tobacco. He reports that he does not drink alcohol or use drugs.   Family History:  The patient's family history includes Arthritis in his mother; Diabetes in his father; Hypertension in his brother, father, and mother; Kidney disease in his brother; Stroke in his brother and father.    ROS:  Please see the history of present illness.   Otherwise, review of systems are positive for none.   All other systems are reviewed and negative.    PHYSICAL EXAM: VS:  BP (!) 142/70   Pulse (!) 48   Ht '5\' 11"'  (1.803 m)   Wt 167 lb 12.8 oz (76.1 kg)   BMI 23.40  kg/m  , BMI Body mass index is 23.4 kg/m. GEN: Well nourished, well developed, in no acute distress  HEENT: normal  Neck: no JVD, carotid bruits, or masses Cardiac: RRR; no murmurs, rubs, or gallops,no edema  Respiratory:  clear to auscultation bilaterally, normal work of breathing GI: soft, nontender, nondistended, + BS MS: no deformity or atrophy  Skin: warm and dry, no rash Neuro:  Strength and sensation are intact Psych: euthymic mood, full affect   EKG:  EKG is ordered today. Sinus brady with rate 48. LAFB, LVH. No significant change from prior except no PVCs seen. I have personally reviewed and interpreted this study.     Recent Labs: No results found for requested labs within last 8760 hours.    Lipid Panel No results found for: CHOL, TRIG, HDL, CHOLHDL, VLDL, LDLCALC, LDLDIRECT    Wt Readings from Last 3 Encounters:  02/08/16 167 lb 12.8 oz (76.1 kg)  07/17/15 170 lb 1.9 oz (77.2 kg)  11/18/14 164 lb 6.4 oz (74.6 kg)        ASSESSMENT AND PLAN:  1. Hypertensive heart disease without heart failure. BP is well controlled. He is asymptomatic. Continue current Rx.  2. Diastolic dysfunction 3. PVCs 4. Aortic valve disease with mild aortic insufficiency 5.LVH.    Current medicines are reviewed at length with the patient today.  The patient does not have concerns regarding medicines.  The following changes have been made:  no change  Labs/ tests ordered today include:   Orders Placed This Encounter  Procedures  . EKG 12-Lead     Disposition:   The patient is to continue current medication.  Encourage increased aerobic activity. Follow up in 6 months.  Signed, Airyana Sprunger [place] MD, [facility]   02/08/2016 12:27 PM    [place]

## 2016-02-08 NOTE — Patient Instructions (Signed)
Continue your current therapy  You need to get more active. Use your Silver sneakers program.

## 2016-05-04 ENCOUNTER — Other Ambulatory Visit: Payer: Self-pay | Admitting: *Deleted

## 2016-05-04 MED ORDER — CARVEDILOL 12.5 MG PO TABS: 12.5000 mg | ORAL_TABLET | Freq: Two times a day (BID) | ORAL | 3 refills | Status: DC

## 2016-08-08 ENCOUNTER — Other Ambulatory Visit: Payer: Self-pay

## 2016-08-08 MED ORDER — ATORVASTATIN CALCIUM 10 MG PO TABS: 5.0000 mg | ORAL_TABLET | Freq: Every day | ORAL | 2 refills | Status: DC

## 2016-08-11 NOTE — Progress Notes (Signed)
Cardiology Office Note   Date:  08/12/2016   ID:  Shawn Stephenson, DOB 1930/06/02, MRN [id number]  PCP:  Shawn Noble, MD  Cardiologist: Shawn Martinique MD  Chief Complaint  Patient presents with  . Follow-up    6 months  . Hypertension      History of Present Illness: Shawn Stephenson is a 83 y.o. male who presents for follow up PVCs and hypertensive heart disease.   He has a past history of frequent PVCs and a past history of hypertensive cardiovascular disease and a history of diastolic dysfunction of the left ventricle. He also has hypercholesterolemia. He is a mild diabetic. The patient had a Myoview stress test on 10/01/13 which showed no ischemia and showed a very small apical scar. The study was not gated because of PVCs and PACs. His last echocardiogram was 02/10/14 and showed normal left ventricular systolic function with ejection fraction of 65%. There was aortic valve sclerosis without stenosis.Marland Kitchen  His  Daughter Shawn Stephenson is a patient of mine and had septal ablation therapy for hypertrophic obstructive cardiomyopathy done at [facility] clinic. She had a excellent result and did well.  The patient has not been getting as much exercise.  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 Denies any chest pain or SOB. Has no palpitations.   He reports he has now been diagnosed with glaucoma. He does report symptoms of dysphagia- trouble swallowing at times. Feels pressure in neck when swallowing. Sometimes things like a pill will get stuck and he has to drink fluids to get down. Also notes posture is worse and he is more stooped.  Past Medical History:  Diagnosis Date  . Asymptomatic PVCs   . Diabetes mellitus    well controlled  . Diastolic dysfunction   . Hyperlipidemia   . Hypertension   . Mitral regurgitation    mild    History reviewed. No pertinent surgical history.   Current Outpatient Prescriptions  Medication Sig Dispense Refill  .  ACCU-CHEK AVIVA PLUS test strip     . ACCU-CHEK SOFTCLIX LANCETS lancets     . amLODipine (NORVASC) 10 MG tablet Take 1 tablet (10 mg total) by mouth daily. 90 tablet 3  . aspirin 81 MG tablet Take 81 mg by mouth daily.      Marland Kitchen atorvastatin (LIPITOR) 10 MG tablet Take 0.5 tablets (5 mg total) by mouth daily. 45 tablet 2  . Blood Glucose Monitoring Suppl (ACCU-CHEK AVIVA PLUS) W/DEVICE KIT     . carvedilol (COREG) 12.5 MG tablet Take 1 tablet (12.5 mg total) by mouth 2 (two) times daily with a meal. 180 tablet 3  . Cholecalciferol (VITAMIN D-3) 1000 UNITS CAPS Take 1,000 Units by mouth daily.    . Cyanocobalamin (VITAMIN B 12 PO) Take 1,000 mcg by mouth daily.     . fexofenadine (ALLEGRA) 180 MG tablet Take 180 mg by mouth daily.    Marland Kitchen glipiZIDE (GLUCOTROL) 5 MG 24 hr tablet Take 5 mg by mouth daily.      Marland Kitchen glucosamine-chondroitin 500-400 MG tablet Take 1 tablet by mouth 3 (three) times daily.    . hydrochlorothiazide (HYDRODIURIL) 25 MG tablet Take 1 tablet (25 mg total) by mouth daily. 90 tablet 3  . MAGNESIUM PO Take 400 mg by mouth daily.     . Multiple Vitamins-Minerals (OCUVITE PRESERVISION PO) Take 1 tablet by mouth daily.    Marland Kitchen OVER THE COUNTER MEDICATION Take 2 capsules  by mouth daily. Co Q 10 + NAC    . OVER THE COUNTER MEDICATION Take 1 tablet by mouth 2 (two) times daily. Med Name: ANTIIVA    . pyridOXINE (VITAMIN B-6) 50 MG tablet Take 50 mg by mouth daily.     No current facility-administered medications for this visit.     Allergies:   Enalapril maleate; Losartan; Menthol; Vasotec; Sulfa antibiotics; and Sulfamethoxazole    Social History:  The patient  reports that he has never smoked. He has never used smokeless tobacco. He reports that he does not drink alcohol or use drugs.   Family History:  The patient's family history includes Arthritis in his mother; Diabetes in his father; Hypertension in his brother, father, and mother; Kidney disease in his brother; Stroke in his  brother and father.    ROS:  Please see the history of present illness.   Otherwise, review of systems are positive for none.   All other systems are reviewed and negative.    PHYSICAL EXAM: VS:  BP (!) 142/68   Pulse (!) 57   Ht _0  (1.803 m)   Wt 167 lb (75.8 kg)   BMI 23.29 kg/m  , BMI Body mass index is 23.29 kg/m. GEN: Well nourished, well developed, in no acute distress  HEENT: normal  Neck: no JVD, carotid bruits, or masses Cardiac: RRR; no murmurs, rubs, or gallops,no edema  Respiratory:  clear to auscultation bilaterally, normal work of breathing GI: soft, nontender, nondistended, + BS MS: no deformity or atrophy  Skin: warm and dry, no rash Neuro:  Strength and sensation are intact Psych: euthymic mood, full affect   EKG:  EKG is not ordered today. Sinus brady with rate 48. LAFB, LVH. No significant change from prior except no PVCs seen. I have personally reviewed and interpreted this study.     Recent Labs: No results found for requested labs within last 8760 hours.    Lipid Panel No results found for: CHOL, TRIG, HDL, CHOLHDL, VLDL, LDLCALC, LDLDIRECT    Wt Readings from Last 3 Encounters:  08/12/16 167 lb (75.8 kg)  02/08/16 167 lb 12.8 oz (76.1 kg)  07/17/15 170 lb 1.9 oz (77.2 kg)    Labs reviewed from 12/28/15: cholesterol 149, triglycerides 74, LDL 88, HDL 46. Glucose 159, A1c 6.4%. Other chemistries, CBC, UA normal.    ASSESSMENT AND PLAN:  1. Hypertensive heart disease without heart failure. BP is under satisfactory control. He is asymptomatic. Continue current Rx.  2. Diastolic dysfunction 3. PVCs 4. Aortic valve disease with mild aortic insufficiency 5.Dysphagia- recommend he discuss with Dr. Willey Stephenson. May need further evaluation with barium swallow vs EGD.  6. Glaucoma  Encouraged increased exercise to include aerobic and strength (core) training.    Current medicines are reviewed at length with the patient today.  The patient does not  have concerns regarding medicines.  The following changes have been made:  no change  Labs/ tests ordered today include:   No orders of the defined types were placed in this encounter.  Follow up in 6 months.  Disposition:   The patient is to continue current medication.  Encourage increased aerobic activity. Follow up in 6 months.  Signed, Shawn Martinique MD, [facility]   08/12/2016 2:31 PM    [place]

## 2016-08-12 ENCOUNTER — Encounter: Payer: Self-pay | Admitting: Cardiology

## 2016-08-12 ENCOUNTER — Ambulatory Visit (INDEPENDENT_AMBULATORY_CARE_PROVIDER_SITE_OTHER): Payer: Medicare Other | Admitting: Cardiology

## 2016-08-12 VITALS — BP 142/68 | HR 57 | Ht 71.0 in | Wt 167.0 lb

## 2016-08-12 DIAGNOSIS — I119 Hypertensive heart disease without heart failure: Secondary | ICD-10-CM | POA: Diagnosis not present

## 2016-08-12 DIAGNOSIS — I351 Nonrheumatic aortic (valve) insufficiency: Secondary | ICD-10-CM | POA: Diagnosis not present

## 2016-08-12 DIAGNOSIS — I493 Ventricular premature depolarization: Secondary | ICD-10-CM

## 2016-08-12 NOTE — Patient Instructions (Signed)
Continue your current therapy  Discuss your swallowing problems with Dr. Ouida SillsFagan  You need to focus on exercise both aerobic and strength training.

## 2017-05-02 ENCOUNTER — Other Ambulatory Visit: Payer: Self-pay | Admitting: Cardiology

## 2017-05-26 ENCOUNTER — Telehealth: Payer: Self-pay | Admitting: Cardiology

## 2017-05-26 MED ORDER — CARVEDILOL 12.5 MG PO TABS: 6.2500 mg | ORAL_TABLET | Freq: Two times a day (BID) | ORAL | 0 refills | Status: DC

## 2017-05-26 NOTE — Telephone Encounter (Signed)
Pt of Dr. SwazilandJordan   Hx: HTN, asymptomatic PVCs.  IDDM  Last echo 2015 - EF of 65%, "mild LVH"  Spoke w patient. Indicated BP low this AM - he does not check BP often, but noted new finding of dizziness this AM, and checked BP - 100/50 w HR of 52. States his typical readings are 130-140 systolic.  States dizziness "comes and goes".  He had a typical breakfast this AM & took his insulin as usual. CBG was in normal range for him. Typically "drinks water all the time" per wife. Pt did get up to drink a glass of water while we spoke. He denies cough, cold, flu symptoms. Denies chest discomfort, shortness of breath, or fatigue.  Informed him I would check to see if BP med titration advised at this time, and call him back w instruction.  He also requested a routine return OV w Dr. SwazilandJordan. Scheduled for 07/27/16 w instruction that I would arrange sooner appt w PA if needed. Pt verbalized thanks and understanding.

## 2017-05-26 NOTE — Telephone Encounter (Signed)
Spoke w Raquel, pharmD who advised cutting the carvedilol tablet in half, for dosage of 6.25mg  BID, and following BP and HR over weekend. She states if pill difficult to split, skip AM dose tomorrow and just take PM dose & call for further instruction if continued concerns.   These recommendations were communicated w patient,. I informed him I would route to Dr. SwazilandJordan for any further advice, & return patient call next week. Advised for any continued concerns over weekend he may make use of our on-call service.  Pt voiced appreciation and understanding of instructions given.

## 2017-05-26 NOTE — Telephone Encounter (Signed)
New message      Pt c/o BP issue: STAT if pt c/o blurred vision, one-sided weakness or slurred speech  1. What are your last 5 BP readings?  100/50 hr 52  2. Are you having any other symptoms (ex. Dizziness, headache, blurred vision, passed out)? Dizziness   3. What is your BP issue?  Started a few minutes ago , doesn't feel any other symptoms , patient go dizzy they checked his vitals and they were low

## 2017-05-26 NOTE — Telephone Encounter (Signed)
Returned patient's call to note that Dr. SwazilandJordan agreed w plan. Med list updated. Pt voiced thanks for f/u call and agreed to call back next week to update us w BP & HR readings, symptoms.

## 2017-05-26 NOTE — Telephone Encounter (Signed)
Agree  [name] MD, FACC   

## 2017-07-25 NOTE — Progress Notes (Deleted)
Cardiology Office Note   Date:  07/25/2017   ID:  Shawn Stephenson, DOB 1930-01-24, MRN [id number]  PCP:  Asencion Noble, MD  Cardiologist: Karolee Meloni Martinique MD  No chief complaint on file.     History of Present Illness: Shawn Stephenson is a 84 y.o. male who presents for follow up PVCs and hypertensive heart disease.   He has a past history of frequent PVCs and a past history of hypertensive cardiovascular disease and a history of diastolic dysfunction of the left ventricle. He also has hypercholesterolemia. He is a mild diabetic. The patient had a Myoview stress test on 10/01/13 which showed no ischemia and showed a very small apical scar. The study was not gated because of PVCs and PACs. His last echocardiogram was 02/10/14 and showed normal left ventricular systolic function with ejection fraction of 65%. There was aortic valve sclerosis without stenosis.Marland Kitchen  His  Daughter Shawn Stephenson is a patient of mine and had septal ablation therapy for hypertrophic obstructive cardiomyopathy done at [facility] clinic. She had a excellent result and did well.  The patient has not been getting as much exercise.  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 Denies any chest pain or SOB. Has no palpitations.   He reports he has now been diagnosed with glaucoma. He does report symptoms of dysphagia- trouble swallowing at times. Feels pressure in neck when swallowing. Sometimes things like a pill will get stuck and he has to drink fluids to get down. Also notes posture is worse and he is more stooped.  Past Medical History:  Diagnosis Date  . Asymptomatic PVCs   . Diabetes mellitus    well controlled  . Diastolic dysfunction   . Hyperlipidemia   . Hypertension   . Mitral regurgitation    mild    No past surgical history on file.   Current Outpatient Medications  Medication Sig Dispense Refill  . ACCU-CHEK AVIVA PLUS test strip     . ACCU-CHEK SOFTCLIX LANCETS lancets      . amLODipine (NORVASC) 10 MG tablet Take 1 tablet (10 mg total) by mouth daily. 90 tablet 3  . aspirin 81 MG tablet Take 81 mg by mouth daily.      Marland Kitchen atorvastatin (LIPITOR) 10 MG tablet Take 0.5 tablets (5 mg total) by mouth daily. 45 tablet 2  . Blood Glucose Monitoring Suppl (ACCU-CHEK AVIVA PLUS) W/DEVICE KIT     . carvedilol (COREG) 12.5 MG tablet Take 0.5 tablets (6.25 mg total) by mouth 2 (two) times daily with a meal. 180 tablet 0  . Cholecalciferol (VITAMIN D-3) 1000 UNITS CAPS Take 1,000 Units by mouth daily.    . Cyanocobalamin (VITAMIN B 12 PO) Take 1,000 mcg by mouth daily.     . fexofenadine (ALLEGRA) 180 MG tablet Take 180 mg by mouth daily.    Marland Kitchen glipiZIDE (GLUCOTROL) 5 MG 24 hr tablet Take 5 mg by mouth daily.      Marland Kitchen glucosamine-chondroitin 500-400 MG tablet Take 1 tablet by mouth 3 (three) times daily.    . hydrochlorothiazide (HYDRODIURIL) 25 MG tablet Take 1 tablet (25 mg total) by mouth daily. 90 tablet 3  . MAGNESIUM PO Take 400 mg by mouth daily.     . Multiple Vitamins-Minerals (OCUVITE PRESERVISION PO) Take 1 tablet by mouth daily.    Marland Kitchen OVER THE COUNTER MEDICATION Take 2 capsules by mouth daily. Co Q 10 + NAC    .  OVER THE COUNTER MEDICATION Take 1 tablet by mouth 2 (two) times daily. Med Name: ANTIIVA    . pyridOXINE (VITAMIN B-6) 50 MG tablet Take 50 mg by mouth daily.     No current facility-administered medications for this visit.     Allergies:   Enalapril maleate; Losartan; Menthol; Vasotec; Sulfa antibiotics; and Sulfamethoxazole    Social History:  The patient  reports that  has never smoked. he has never used smokeless tobacco. He reports that he does not drink alcohol or use drugs.   Family History:  The patient's family history includes Arthritis in his mother; Diabetes in his father; Hypertension in his brother, father, and mother; Kidney disease in his brother; Stroke in his brother and father.    ROS:  Please see the history of present illness.    Otherwise, review of systems are positive for none.   All other systems are reviewed and negative.    PHYSICAL EXAM: VS:  There were no vitals taken for this visit. , BMI There is no height or weight on file to calculate BMI. GEN: Well nourished, well developed, in no acute distress  HEENT: normal  Neck: no JVD, carotid bruits, or masses Cardiac: RRR; no murmurs, rubs, or gallops,no edema  Respiratory:  clear to auscultation bilaterally, normal work of breathing GI: soft, nontender, nondistended, + BS MS: no deformity or atrophy  Skin: warm and dry, no rash Neuro:  Strength and sensation are intact Psych: euthymic mood, full affect   EKG:  EKG is not ordered today. Sinus brady with rate 48. LAFB, LVH. No significant change from prior except no PVCs seen. I have personally reviewed and interpreted this study.     Recent Labs: No results found for requested labs within last 8760 hours.    Lipid Panel No results found for: CHOL, TRIG, HDL, CHOLHDL, VLDL, LDLCALC, LDLDIRECT    Wt Readings from Last 3 Encounters:  08/12/16 167 lb (75.8 kg)  02/08/16 167 lb 12.8 oz (76.1 kg)  07/17/15 170 lb 1.9 oz (77.2 kg)    Labs reviewed from 12/28/15: cholesterol 149, triglycerides 74, LDL 88, HDL 46. Glucose 159, A1c 6.4%. Other chemistries, CBC, UA normal.    ASSESSMENT AND PLAN:  1. Hypertensive heart disease without heart failure. BP is under satisfactory control. He is asymptomatic. Continue current Rx.  2. Diastolic dysfunction 3. PVCs 4. Aortic valve disease with mild aortic insufficiency 5.Dysphagia- recommend he discuss with Dr. Willey Blade. May need further evaluation with barium swallow vs EGD.  6. Glaucoma  Encouraged increased exercise to include aerobic and strength (core) training.    Current medicines are reviewed at length with the patient today.  The patient does not have concerns regarding medicines.  The following changes have been made:  no change  Labs/ tests  ordered today include:   No orders of the defined types were placed in this encounter.  Follow up in 6 months.  Disposition:   The patient is to continue current medication.  Encourage increased aerobic activity. Follow up in 6 months.  Signed, Kegan Shepardson Martinique MD, [facility]   07/25/2017 12:54 PM    [place]

## 2017-07-27 ENCOUNTER — Ambulatory Visit: Payer: Medicare Other | Admitting: Cardiology

## 2017-09-04 ENCOUNTER — Other Ambulatory Visit: Payer: Self-pay | Admitting: Cardiology

## 2017-09-18 NOTE — Progress Notes (Signed)
Cardiology Office Note   Date:  09/19/2017   ID:  Shawn Stephenson, DOB Jun 27, 1929, MRN [id number]  PCP:  Asencion Noble, MD  Cardiologist: Peter Martinique MD  Chief Complaint  Patient presents with  . Follow-up  . Hypertension      History of Present Illness: Shawn Stephenson is a 84 y.o. male who presents for follow up PVCs and hypertensive heart disease.   He has a past history of frequent PVCs and a past history of hypertensive cardiovascular disease and a history of diastolic dysfunction of the left ventricle. He also has hypercholesterolemia. He is a mild diabetic. The patient had a Myoview stress test on 10/01/13 which showed no ischemia and showed a very small apical scar. The study was not gated because of PVCs and PACs. His last echocardiogram was 02/10/14 and showed normal left ventricular systolic function with ejection fraction of 65%. There was aortic valve sclerosis without stenosis.Marland Kitchen  His  Daughter Tonna Boehringer is a patient of mine and had septal ablation therapy for hypertrophic obstructive cardiomyopathy done at [facility] clinic. She had a excellent result and did well.  On follow up today he is doing very well. Stays active. In December he complained of dizziness and BP was down to 100/50 and pulse 52. Coreg dose decreased. Since then dizziness resolved and BP at home has been well controlled. No dyspnea, chest pain, palpitations or edema.  Past Medical History:  Diagnosis Date  . Asymptomatic PVCs   . Diabetes mellitus    well controlled  . Diastolic dysfunction   . Hyperlipidemia   . Hypertension   . Mitral regurgitation    mild    History reviewed. No pertinent surgical history.   Current Outpatient Medications  Medication Sig Dispense Refill  . ACCU-CHEK AVIVA PLUS test strip     . ACCU-CHEK SOFTCLIX LANCETS lancets     . amLODipine (NORVASC) 10 MG tablet Take 1 tablet (10 mg total) by mouth daily. 90 tablet 3  . aspirin 81 MG tablet Take 81 mg by  mouth daily.      Marland Kitchen atorvastatin (LIPITOR) 10 MG tablet TAKE ONE-HALF TABLET BY MOUTH DAILY. 30 tablet 0  . Blood Glucose Monitoring Suppl (ACCU-CHEK AVIVA PLUS) W/DEVICE KIT     . carvedilol (COREG) 12.5 MG tablet Take 0.5 tablets (6.25 mg total) by mouth 2 (two) times daily with a meal. 180 tablet 0  . Cholecalciferol (VITAMIN D-3) 1000 UNITS CAPS Take 1,000 Units by mouth daily.    . Cyanocobalamin (VITAMIN B 12 PO) Take 1,000 mcg by mouth daily.     . fexofenadine (ALLEGRA) 180 MG tablet Take 180 mg by mouth daily.    Marland Kitchen glipiZIDE (GLUCOTROL) 5 MG 24 hr tablet Take 5 mg by mouth daily.      Marland Kitchen glucosamine-chondroitin 500-400 MG tablet Take 1 tablet by mouth 3 (three) times daily.    . hydrochlorothiazide (HYDRODIURIL) 25 MG tablet Take 1 tablet (25 mg total) by mouth daily. 90 tablet 3  . MAGNESIUM PO Take 400 mg by mouth daily.     . Multiple Vitamins-Minerals (OCUVITE PRESERVISION PO) Take 1 tablet by mouth daily.    Marland Kitchen OVER THE COUNTER MEDICATION Take 2 capsules by mouth daily. Co Q 10 + NAC    . OVER THE COUNTER MEDICATION Take 1 tablet by mouth 2 (two) times daily. Med Name: ANTIIVA    . pyridOXINE (VITAMIN B-6) 50 MG tablet Take 50 mg by mouth daily.  No current facility-administered medications for this visit.     Allergies:   Enalapril maleate; Losartan; Menthol; Vasotec; Sulfa antibiotics; and Sulfamethoxazole    Social History:  The patient  reports that he has never smoked. He has never used smokeless tobacco. He reports that he does not drink alcohol or use drugs.   Family History:  The patient's family history includes Arthritis in his mother; Diabetes in his father; Hypertension in his brother, father, and mother; Kidney disease in his brother; Stroke in his brother and father.    ROS:  Please see the history of present illness.   Otherwise, review of systems are positive for none.   All other systems are reviewed and negative.    PHYSICAL EXAM: VS:  BP 130/70 (BP  Location: Right Arm, Cuff Size: Normal)   Pulse 65   Ht 5' 11.5" (1.816 m)   Wt 166 lb 6.4 oz (75.5 kg)   BMI 22.88 kg/m  , BMI Body mass index is 22.88 kg/m. GENERAL:  Well appearing elderly WM in NAD HEENT:  PERRL, EOMI, sclera are clear. Oropharynx is clear. NECK:  No jugular venous distention, carotid upstroke brisk and symmetric, no bruits, no thyromegaly or adenopathy LUNGS:  Clear to auscultation bilaterally CHEST:  Unremarkable HEART:  RRR,  PMI not displaced or sustained,S1 and S2 within normal limits, no S3, no S4: no clicks, no rubs, no murmurs ABD:  Soft, nontender. BS +, no masses or bruits. No hepatomegaly, no splenomegaly EXT:  2 + pulses throughout, no edema, no cyanosis no clubbing SKIN:  Warm and dry.  No rashes NEURO:  Alert and oriented x 3. Cranial nerves II through XII intact. PSYCH:  Cognitively intact     EKG:  EKG is ordered today. Sinus rate 65.  LAFB, LVH. No significant change from prior. I have personally reviewed and interpreted this study.     Recent Labs: No results found for requested labs within last 8760 hours.    Lipid Panel No results found for: CHOL, TRIG, HDL, CHOLHDL, VLDL, LDLCALC, LDLDIRECT    Wt Readings from Last 3 Encounters:  09/19/17 166 lb 6.4 oz (75.5 kg)  08/12/16 167 lb (75.8 kg)  02/08/16 167 lb 12.8 oz (76.1 kg)    Labs reviewed from 12/28/15: cholesterol 149, triglycerides 74, LDL 88, HDL 46. Glucose 159, A1c 6.4%. Other chemistries, CBC, UA normal. Dated 02/03/17: cholesterol 134, triglycerides 77, HDL 41. A1c 6.7%. CBC and chemistries normal.    ASSESSMENT AND PLAN:  1. Hypertensive heart disease without heart failure. BP is well controlled.  He is asymptomatic. Continue current Rx.  2. Diastolic dysfunction 3. PVCs 4. Aortic valve disease with mild aortic insufficiency  Follow up in one year   Signed, Peter Martinique MD, [facility]   09/19/2017 4:15 PM    [place]

## 2017-09-19 ENCOUNTER — Encounter: Payer: Self-pay | Admitting: Cardiology

## 2017-09-19 ENCOUNTER — Ambulatory Visit: Payer: Medicare Other | Admitting: Cardiology

## 2017-09-19 VITALS — BP 130/70 | HR 65 | Ht 71.5 in | Wt 166.4 lb

## 2017-09-19 DIAGNOSIS — I119 Hypertensive heart disease without heart failure: Secondary | ICD-10-CM | POA: Diagnosis not present

## 2017-09-19 DIAGNOSIS — E7800 Pure hypercholesterolemia, unspecified: Secondary | ICD-10-CM

## 2017-09-19 DIAGNOSIS — I519 Heart disease, unspecified: Secondary | ICD-10-CM

## 2017-09-19 NOTE — Patient Instructions (Addendum)
Continue your current therapy  I will see you in one year   

## 2017-10-09 ENCOUNTER — Other Ambulatory Visit: Payer: Self-pay | Admitting: Cardiology

## 2017-10-10 ENCOUNTER — Telehealth: Payer: Self-pay | Admitting: Cardiology

## 2017-10-10 MED ORDER — CARVEDILOL 6.25 MG PO TABS: 6.2500 mg | ORAL_TABLET | Freq: Two times a day (BID) | ORAL | 3 refills | Status: DC

## 2017-10-10 NOTE — Telephone Encounter (Signed)
Spoke to [place] at [facility]-verified dosage of carvedilol 6.25 mg two times daily.   Correct rx sent to pharm

## 2017-10-10 NOTE — Telephone Encounter (Signed)
New message    Pt c/o medication issue:  1. Name of Medication: carvedilol (COREG) 12.5 MG tablet  2. How are you currently taking this medication (dosage and times per day)?  3. Are you having a reaction (difficulty breathing--STAT)? No  4. What is your medication issue? Mardelle MatteAndy from [facility] calling to clarify dosage

## 2017-10-31 ENCOUNTER — Other Ambulatory Visit: Payer: Self-pay | Admitting: Cardiology

## 2017-12-09 ENCOUNTER — Inpatient Hospital Stay (HOSPITAL_COMMUNITY): Payer: Medicare Other | Admitting: Anesthesiology

## 2017-12-09 ENCOUNTER — Inpatient Hospital Stay (HOSPITAL_COMMUNITY): Payer: Medicare Other

## 2017-12-09 ENCOUNTER — Other Ambulatory Visit: Payer: Self-pay

## 2017-12-09 ENCOUNTER — Encounter (HOSPITAL_COMMUNITY): Payer: Self-pay | Admitting: Emergency Medicine

## 2017-12-09 ENCOUNTER — Emergency Department (HOSPITAL_COMMUNITY): Payer: Medicare Other

## 2017-12-09 ENCOUNTER — Encounter (HOSPITAL_COMMUNITY)
Admission: EM | Disposition: A | Discharge status: Home Health | Payer: Self-pay | Source: Home / Self Care | Attending: Internal Medicine

## 2017-12-09 ENCOUNTER — Inpatient Hospital Stay (HOSPITAL_COMMUNITY)
Admission: EM | Admit: 2017-12-09 | Discharge: 2017-12-12 | DRG: 481 | Disposition: A | Discharge status: Home Health | Payer: Medicare Other | Attending: Internal Medicine | Admitting: Internal Medicine

## 2017-12-09 DIAGNOSIS — W1830XA Fall on same level, unspecified, initial encounter: Secondary | ICD-10-CM | POA: Diagnosis present

## 2017-12-09 DIAGNOSIS — Z8679 Personal history of other diseases of the circulatory system: Secondary | ICD-10-CM

## 2017-12-09 DIAGNOSIS — R42 Dizziness and giddiness: Secondary | ICD-10-CM | POA: Insufficient documentation

## 2017-12-09 DIAGNOSIS — S72141D Displaced intertrochanteric fracture of right femur, subsequent encounter for closed fracture with routine healing: Secondary | ICD-10-CM | POA: Diagnosis not present

## 2017-12-09 DIAGNOSIS — Z7984 Long term (current) use of oral hypoglycemic drugs: Secondary | ICD-10-CM

## 2017-12-09 DIAGNOSIS — Z79899 Other long term (current) drug therapy: Secondary | ICD-10-CM

## 2017-12-09 DIAGNOSIS — I080 Rheumatic disorders of both mitral and aortic valves: Secondary | ICD-10-CM | POA: Diagnosis present

## 2017-12-09 DIAGNOSIS — E876 Hypokalemia: Secondary | ICD-10-CM | POA: Diagnosis not present

## 2017-12-09 DIAGNOSIS — I110 Hypertensive heart disease with heart failure: Secondary | ICD-10-CM | POA: Diagnosis present

## 2017-12-09 DIAGNOSIS — I519 Heart disease, unspecified: Secondary | ICD-10-CM | POA: Diagnosis not present

## 2017-12-09 DIAGNOSIS — E119 Type 2 diabetes mellitus without complications: Secondary | ICD-10-CM

## 2017-12-09 DIAGNOSIS — Z7982 Long term (current) use of aspirin: Secondary | ICD-10-CM

## 2017-12-09 DIAGNOSIS — Z882 Allergy status to sulfonamides status: Secondary | ICD-10-CM | POA: Diagnosis not present

## 2017-12-09 DIAGNOSIS — Z888 Allergy status to other drugs, medicaments and biological substances status: Secondary | ICD-10-CM | POA: Diagnosis not present

## 2017-12-09 DIAGNOSIS — I422 Other hypertrophic cardiomyopathy: Secondary | ICD-10-CM | POA: Diagnosis present

## 2017-12-09 DIAGNOSIS — E860 Dehydration: Secondary | ICD-10-CM | POA: Diagnosis present

## 2017-12-09 DIAGNOSIS — I5030 Unspecified diastolic (congestive) heart failure: Secondary | ICD-10-CM | POA: Diagnosis not present

## 2017-12-09 DIAGNOSIS — D62 Acute posthemorrhagic anemia: Secondary | ICD-10-CM | POA: Diagnosis not present

## 2017-12-09 DIAGNOSIS — I351 Nonrheumatic aortic (valve) insufficiency: Secondary | ICD-10-CM | POA: Diagnosis present

## 2017-12-09 DIAGNOSIS — E0865 Diabetes mellitus due to underlying condition with hyperglycemia: Secondary | ICD-10-CM | POA: Diagnosis not present

## 2017-12-09 DIAGNOSIS — E1165 Type 2 diabetes mellitus with hyperglycemia: Secondary | ICD-10-CM | POA: Diagnosis present

## 2017-12-09 DIAGNOSIS — W19XXXA Unspecified fall, initial encounter: Secondary | ICD-10-CM | POA: Insufficient documentation

## 2017-12-09 DIAGNOSIS — Z9109 Other allergy status, other than to drugs and biological substances: Secondary | ICD-10-CM

## 2017-12-09 DIAGNOSIS — S7291XA Unspecified fracture of right femur, initial encounter for closed fracture: Secondary | ICD-10-CM | POA: Diagnosis present

## 2017-12-09 DIAGNOSIS — Y92008 Other place in unspecified non-institutional (private) residence as the place of occurrence of the external cause: Secondary | ICD-10-CM | POA: Diagnosis not present

## 2017-12-09 DIAGNOSIS — R402414 Glasgow coma scale score 13-15, 24 hours or more after hospital admission: Secondary | ICD-10-CM | POA: Diagnosis not present

## 2017-12-09 DIAGNOSIS — S72141A Displaced intertrochanteric fracture of right femur, initial encounter for closed fracture: Secondary | ICD-10-CM | POA: Diagnosis present

## 2017-12-09 DIAGNOSIS — I517 Cardiomegaly: Secondary | ICD-10-CM

## 2017-12-09 DIAGNOSIS — E785 Hyperlipidemia, unspecified: Secondary | ICD-10-CM | POA: Diagnosis present

## 2017-12-09 DIAGNOSIS — R944 Abnormal results of kidney function studies: Secondary | ICD-10-CM | POA: Diagnosis present

## 2017-12-09 DIAGNOSIS — I5032 Chronic diastolic (congestive) heart failure: Secondary | ICD-10-CM | POA: Diagnosis present

## 2017-12-09 DIAGNOSIS — I119 Hypertensive heart disease without heart failure: Secondary | ICD-10-CM | POA: Diagnosis not present

## 2017-12-09 DIAGNOSIS — Z419 Encounter for procedure for purposes other than remedying health state, unspecified: Secondary | ICD-10-CM

## 2017-12-09 DIAGNOSIS — R55 Syncope and collapse: Secondary | ICD-10-CM

## 2017-12-09 HISTORY — PX: INTRAMEDULLARY (IM) NAIL INTERTROCHANTERIC: SHX5875

## 2017-12-09 LAB — CBC WITH DIFFERENTIAL/PLATELET
Basophils Absolute: 0 10*3/uL (ref 0.0–0.1)
Basophils Relative: 0 %
EOS ABS: 0.1 10*3/uL (ref 0.0–0.7)
EOS PCT: 1 %
HCT: 43.2 % (ref 39.0–52.0)
Hemoglobin: 14.6 g/dL (ref 13.0–17.0)
LYMPHS ABS: 1.1 10*3/uL (ref 0.7–4.0)
Lymphocytes Relative: 20 %
MCH: 31.3 pg (ref 26.0–34.0)
MCHC: 33.8 g/dL (ref 30.0–36.0)
MCV: 92.7 fL (ref 78.0–100.0)
MONO ABS: 0.3 10*3/uL (ref 0.1–1.0)
MONOS PCT: 6 %
Neutro Abs: 4.1 10*3/uL (ref 1.7–7.7)
Neutrophils Relative %: 73 %
PLATELETS: 154 10*3/uL (ref 150–400)
RBC: 4.66 MIL/uL (ref 4.22–5.81)
RDW: 12.7 % (ref 11.5–15.5)
WBC: 5.7 10*3/uL (ref 4.0–10.5)

## 2017-12-09 LAB — MAGNESIUM: MAGNESIUM: 2.1 mg/dL (ref 1.7–2.4)

## 2017-12-09 LAB — COMPREHENSIVE METABOLIC PANEL
ALT: 33 U/L (ref 17–63)
ANION GAP: 8 (ref 5–15)
AST: 31 U/L (ref 15–41)
Albumin: 4 g/dL (ref 3.5–5.0)
Alkaline Phosphatase: 60 U/L (ref 38–126)
BUN: 25 mg/dL — ABNORMAL HIGH (ref 6–20)
CHLORIDE: 100 mmol/L — AB (ref 101–111)
CO2: 29 mmol/L (ref 22–32)
Calcium: 9 mg/dL (ref 8.9–10.3)
Creatinine, Ser: 0.8 mg/dL (ref 0.61–1.24)
GFR calc non Af Amer: >60 mL/min (ref >60)
Glucose, Bld: 257 mg/dL — ABNORMAL HIGH (ref 65–99)
POTASSIUM: 3.5 mmol/L (ref 3.5–5.1)
SODIUM: 137 mmol/L (ref 135–145)
Total Bilirubin: 1.2 mg/dL (ref 0.3–1.2)
Total Protein: 7.1 g/dL (ref 6.5–8.1)

## 2017-12-09 LAB — CBC
HCT: 39.2 % (ref 39.0–52.0)
HEMOGLOBIN: 13.4 g/dL (ref 13.0–17.0)
MCH: 31.5 pg (ref 26.0–34.0)
MCHC: 34.2 g/dL (ref 30.0–36.0)
MCV: 92 fL (ref 78.0–100.0)
Platelets: 155 10*3/uL (ref 150–400)
RBC: 4.26 MIL/uL (ref 4.22–5.81)
RDW: 12.3 % (ref 11.5–15.5)
WBC: 10.6 10*3/uL — ABNORMAL HIGH (ref 4.0–10.5)

## 2017-12-09 LAB — GLUCOSE, CAPILLARY: GLUCOSE-CAPILLARY: 168 mg/dL — AB (ref 65–99)

## 2017-12-09 LAB — CREATININE, SERUM
Creatinine, Ser: 0.81 mg/dL (ref 0.61–1.24)
GFR calc Af Amer: >60 mL/min (ref >60)
GFR calc non Af Amer: >60 mL/min (ref >60)

## 2017-12-09 LAB — CBG MONITORING, ED: Glucose-Capillary: 166 mg/dL — ABNORMAL HIGH (ref 65–99)

## 2017-12-09 SURGERY — FIXATION, FRACTURE, INTERTROCHANTERIC, WITH INTRAMEDULLARY ROD
Anesthesia: Spinal | Site: Hip | Laterality: Right

## 2017-12-09 MED ORDER — INSULIN ASPART 100 UNIT/ML ~~LOC~~ SOLN
0.0000 [IU] | SUBCUTANEOUS | Status: DC
  Administered 2017-12-10 (×3): 2 [IU] via SUBCUTANEOUS

## 2017-12-09 MED ORDER — AMLODIPINE BESYLATE 10 MG PO TABS
10.0000 mg | ORAL_TABLET | Freq: Every day | ORAL | Status: DC
  Administered 2017-12-10 – 2017-12-12 (×3): 10 mg via ORAL
  Filled 2017-12-09 (×3): qty 1

## 2017-12-09 MED ORDER — MEPERIDINE HCL 25 MG/ML IJ SOLN
6.2500 mg | INTRAMUSCULAR | Status: DC | PRN
  Administered 2017-12-10 (×2): 12.5 mg via INTRAVENOUS
  Filled 2017-12-09: qty 1

## 2017-12-09 MED ORDER — GLUCOSAMINE-CHONDROITIN 500-400 MG PO TABS: 1.0000 | ORAL_TABLET | Freq: Three times a day (TID) | ORAL | Status: DC

## 2017-12-09 MED ORDER — ATORVASTATIN CALCIUM 10 MG PO TABS
5.0000 mg | ORAL_TABLET | Freq: Every day | ORAL | Status: DC
  Administered 2017-12-10 – 2017-12-12 (×3): 5 mg via ORAL
  Filled 2017-12-09 (×3): qty 1

## 2017-12-09 MED ORDER — POVIDONE-IODINE 10 % EX SWAB: 2.0000 "application " | Freq: Once | CUTANEOUS | Status: DC

## 2017-12-09 MED ORDER — PROPOFOL 10 MG/ML IV BOLUS
INTRAVENOUS | Status: DC | PRN
  Administered 2017-12-09: 30 mg via INTRAVENOUS

## 2017-12-09 MED ORDER — ONDANSETRON HCL 4 MG/2ML IJ SOLN
4.0000 mg | Freq: Once | INTRAMUSCULAR | Status: AC
  Administered 2017-12-09: 4 mg via INTRAVENOUS
  Filled 2017-12-09: qty 2

## 2017-12-09 MED ORDER — CHLORHEXIDINE GLUCONATE 4 % EX LIQD
60.0000 mL | Freq: Once | CUTANEOUS | Status: AC
  Administered 2017-12-09: 4 via TOPICAL
  Filled 2017-12-09: qty 60

## 2017-12-09 MED ORDER — HYDROMORPHONE HCL 1 MG/ML IJ SOLN: 0.2500 mg | INTRAMUSCULAR | Status: DC | PRN

## 2017-12-09 MED ORDER — ASPIRIN 81 MG PO CHEW
81.0000 mg | CHEWABLE_TABLET | Freq: Every day | ORAL | Status: DC
  Administered 2017-12-10 – 2017-12-12 (×4): 81 mg via ORAL
  Filled 2017-12-09 (×4): qty 1

## 2017-12-09 MED ORDER — VITAMIN B-6 25 MG PO TABS
25.0000 mg | ORAL_TABLET | Freq: Every day | ORAL | Status: DC
  Administered 2017-12-10 – 2017-12-12 (×3): 25 mg via ORAL
  Filled 2017-12-09 (×3): qty 1

## 2017-12-09 MED ORDER — VITAMIN D3 25 MCG (1000 UNIT) PO TABS
1000.0000 [IU] | ORAL_TABLET | Freq: Every day | ORAL | Status: DC
  Administered 2017-12-10 – 2017-12-12 (×3): 1000 [IU] via ORAL
  Filled 2017-12-09 (×6): qty 1

## 2017-12-09 MED ORDER — METHOCARBAMOL 500 MG PO TABS
500.0000 mg | ORAL_TABLET | Freq: Four times a day (QID) | ORAL | Status: DC | PRN
  Administered 2017-12-10 – 2017-12-12 (×3): 500 mg via ORAL
  Filled 2017-12-09 (×3): qty 1

## 2017-12-09 MED ORDER — BUPIVACAINE HCL (PF) 0.5 % IJ SOLN
INTRAMUSCULAR | Status: DC | PRN
  Administered 2017-12-09: 3 mL

## 2017-12-09 MED ORDER — HYDROMORPHONE HCL 1 MG/ML IJ SOLN
0.5000 mg | Freq: Once | INTRAMUSCULAR | Status: AC
  Administered 2017-12-09: 0.5 mg via INTRAVENOUS
  Filled 2017-12-09: qty 1

## 2017-12-09 MED ORDER — HYDROCODONE-ACETAMINOPHEN 5-325 MG PO TABS
1.0000 | ORAL_TABLET | Freq: Four times a day (QID) | ORAL | Status: DC | PRN
  Administered 2017-12-09 – 2017-12-10 (×2): 2 via ORAL
  Administered 2017-12-10: 1 via ORAL
  Administered 2017-12-10 – 2017-12-11 (×3): 2 via ORAL
  Administered 2017-12-12 (×2): 1 via ORAL
  Filled 2017-12-09: qty 1
  Filled 2017-12-09: qty 2
  Filled 2017-12-09: qty 1
  Filled 2017-12-09 (×3): qty 2
  Filled 2017-12-09: qty 1
  Filled 2017-12-09: qty 2

## 2017-12-09 MED ORDER — LACTATED RINGERS IV SOLN
INTRAVENOUS | Status: DC | PRN
  Administered 2017-12-09: 22:00:00 via INTRAVENOUS

## 2017-12-09 MED ORDER — CEFAZOLIN SODIUM-DEXTROSE 2-4 GM/100ML-% IV SOLN
2.0000 g | INTRAVENOUS | Status: DC
  Filled 2017-12-09: qty 100

## 2017-12-09 MED ORDER — FENTANYL CITRATE (PF) 250 MCG/5ML IJ SOLN
INTRAMUSCULAR | Status: AC
  Filled 2017-12-09: qty 5

## 2017-12-09 MED ORDER — PROPOFOL 500 MG/50ML IV EMUL
INTRAVENOUS | Status: DC | PRN
  Administered 2017-12-09: 50 ug/kg/min via INTRAVENOUS

## 2017-12-09 MED ORDER — PHENYLEPHRINE HCL 10 MG/ML IJ SOLN
INTRAVENOUS | Status: DC | PRN
  Administered 2017-12-09: 50 ug/min via INTRAVENOUS

## 2017-12-09 MED ORDER — ENOXAPARIN SODIUM 40 MG/0.4ML ~~LOC~~ SOLN
40.0000 mg | SUBCUTANEOUS | Status: DC
  Filled 2017-12-09: qty 0.4

## 2017-12-09 MED ORDER — SENNA 8.6 MG PO TABS
1.0000 | ORAL_TABLET | Freq: Two times a day (BID) | ORAL | Status: DC
  Administered 2017-12-10 – 2017-12-12 (×5): 8.6 mg via ORAL
  Filled 2017-12-09 (×5): qty 1

## 2017-12-09 MED ORDER — ONDANSETRON HCL 4 MG/2ML IJ SOLN: 4.0000 mg | Freq: Once | INTRAMUSCULAR | Status: DC | PRN

## 2017-12-09 MED ORDER — LATANOPROST 0.005 % OP SOLN
1.0000 [drp] | Freq: Every day | OPHTHALMIC | Status: DC
  Filled 2017-12-09: qty 2.5

## 2017-12-09 MED ORDER — CARVEDILOL 6.25 MG PO TABS
6.2500 mg | ORAL_TABLET | Freq: Two times a day (BID) | ORAL | Status: DC
  Administered 2017-12-10 – 2017-12-12 (×5): 6.25 mg via ORAL
  Filled 2017-12-09 (×5): qty 1

## 2017-12-09 MED ORDER — CEFAZOLIN SODIUM-DEXTROSE 2-4 GM/100ML-% IV SOLN
2.0000 g | INTRAVENOUS | Status: AC
  Administered 2017-12-09: 2 g via INTRAVENOUS
  Filled 2017-12-09 (×2): qty 100

## 2017-12-09 MED ORDER — LORATADINE 10 MG PO TABS
10.0000 mg | ORAL_TABLET | Freq: Every day | ORAL | Status: DC
  Administered 2017-12-10 – 2017-12-12 (×3): 10 mg via ORAL
  Filled 2017-12-09 (×3): qty 1

## 2017-12-09 MED ORDER — METHOCARBAMOL 1000 MG/10ML IJ SOLN
500.0000 mg | Freq: Four times a day (QID) | INTRAVENOUS | Status: DC | PRN
  Filled 2017-12-09: qty 5

## 2017-12-09 MED ORDER — ONDANSETRON HCL 4 MG/2ML IJ SOLN
INTRAMUSCULAR | Status: DC | PRN
  Administered 2017-12-09: 4 mg via INTRAVENOUS

## 2017-12-09 MED ORDER — POTASSIUM CHLORIDE IN NACL 20-0.9 MEQ/L-% IV SOLN
INTRAVENOUS | Status: DC
  Administered 2017-12-09 – 2017-12-10 (×2): via INTRAVENOUS
  Filled 2017-12-09 (×2): qty 1000

## 2017-12-09 MED ORDER — FENTANYL CITRATE (PF) 100 MCG/2ML IJ SOLN
INTRAMUSCULAR | Status: DC | PRN
  Administered 2017-12-09: 50 ug via INTRAVENOUS

## 2017-12-09 MED ORDER — MAGNESIUM OXIDE 400 (241.3 MG) MG PO TABS
400.0000 mg | ORAL_TABLET | Freq: Every day | ORAL | Status: DC
  Administered 2017-12-10 – 2017-12-12 (×3): 400 mg via ORAL
  Filled 2017-12-09 (×3): qty 1

## 2017-12-09 MED ORDER — MORPHINE SULFATE (PF) 2 MG/ML IV SOLN
0.5000 mg | INTRAVENOUS | Status: DC | PRN
  Administered 2017-12-10: 0.5 mg via INTRAVENOUS
  Filled 2017-12-09: qty 1

## 2017-12-09 MED ORDER — VITAMIN B-12 1000 MCG PO TABS
1000.0000 ug | ORAL_TABLET | Freq: Every day | ORAL | Status: DC
  Administered 2017-12-10 – 2017-12-12 (×3): 1000 ug via ORAL
  Filled 2017-12-09 (×3): qty 1

## 2017-12-09 SURGICAL SUPPLY — 47 items
ADH SKN CLS APL DERMABOND .7 (GAUZE/BANDAGES/DRESSINGS) ×1
ALCOHOL ISOPROPYL (RUBBING) (MISCELLANEOUS) ×3 IMPLANT
BIT DRILL CANN LG 4.3MM (BIT) IMPLANT
CHLORAPREP W/TINT 26ML (MISCELLANEOUS) ×3 IMPLANT
COVER PERINEAL POST (MISCELLANEOUS) ×3 IMPLANT
COVER SURGICAL LIGHT HANDLE (MISCELLANEOUS) ×3 IMPLANT
DERMABOND ADVANCED (GAUZE/BANDAGES/DRESSINGS) ×2
DERMABOND ADVANCED .7 DNX12 (GAUZE/BANDAGES/DRESSINGS) ×2 IMPLANT
DRAPE C-ARM 42X72 X-RAY (DRAPES) ×3 IMPLANT
DRAPE C-ARMOR (DRAPES) ×3 IMPLANT
DRAPE HALF SHEET 40X57 (DRAPES) ×3 IMPLANT
DRAPE IMP U-DRAPE 54X76 (DRAPES) ×6 IMPLANT
DRAPE STERI IOBAN 125X83 (DRAPES) ×3 IMPLANT
DRAPE U-SHAPE 47X51 STRL (DRAPES) ×6 IMPLANT
DRAPE UNIVERSAL PACK (DRAPES) ×3 IMPLANT
DRILL BIT CANN LG 4.3MM (BIT) ×3
DRSG MEPILEX BORDER 4X4 (GAUZE/BANDAGES/DRESSINGS) ×6 IMPLANT
ELECT REM PT RETURN 9FT ADLT (ELECTROSURGICAL)
ELECTRODE REM PT RTRN 9FT ADLT (ELECTROSURGICAL) ×1 IMPLANT
FACESHIELD WRAPAROUND (MASK) ×3 IMPLANT
FACESHIELD WRAPAROUND OR TEAM (MASK) ×1 IMPLANT
GLOVE BIO SURGEON STRL SZ8.5 (GLOVE) ×6 IMPLANT
GLOVE BIOGEL PI IND STRL 8.5 (GLOVE) ×1 IMPLANT
GLOVE BIOGEL PI INDICATOR 8.5 (GLOVE) ×2
GLOVE SURG SS PI 7.0 STRL IVOR (GLOVE) ×4 IMPLANT
GOWN STRL REUS W/ TWL LRG LVL3 (GOWN DISPOSABLE) ×2 IMPLANT
GOWN STRL REUS W/TWL 2XL LVL3 (GOWN DISPOSABLE) ×3 IMPLANT
GOWN STRL REUS W/TWL LRG LVL3 (GOWN DISPOSABLE) ×6
GUIDEPIN 3.2X17.5 THRD DISP (PIN) ×2 IMPLANT
GUIDEWIRE BALL NOSE 100CM (WIRE) ×2 IMPLANT
HFN 125 DEG 11MM X 180MM (Orthopedic Implant) ×2 IMPLANT
HIP FRAC NAIL LAG SCR 10.5X100 (Orthopedic Implant) ×2 IMPLANT
KIT TURNOVER KIT B (KITS) ×3 IMPLANT
MARKER SKIN DUAL TIP RULER LAB (MISCELLANEOUS) ×3 IMPLANT
NS IRRIG 1000ML POUR BTL (IV SOLUTION) ×3 IMPLANT
PACK GENERAL/GYN (CUSTOM PROCEDURE TRAY) ×3 IMPLANT
PAD ARMBOARD 7.5X6 YLW CONV (MISCELLANEOUS) ×10 IMPLANT
SCREW BONE CORTICAL 5.0X36 (Screw) ×2 IMPLANT
SCREW CANN THRD AFF 10.5X100 (Orthopedic Implant) IMPLANT
SUT MNCRL AB 3-0 PS2 27 (SUTURE) ×3 IMPLANT
SUT MON AB 2-0 CT1 27 (SUTURE) ×3 IMPLANT
SUT VIC AB 1 CT1 27 (SUTURE) ×3
SUT VIC AB 1 CT1 27XBRD ANBCTR (SUTURE) ×1 IMPLANT
TOWEL OR 17X24 6PK STRL BLUE (TOWEL DISPOSABLE) ×3 IMPLANT
TOWEL OR 17X26 10 PK STRL BLUE (TOWEL DISPOSABLE) ×3 IMPLANT
TRAY FOL W/BAG SLVR 16FR STRL (SET/KITS/TRAYS/PACK) IMPLANT
TRAY FOLEY W/BAG SLVR 16FR LF (SET/KITS/TRAYS/PACK) ×3

## 2017-12-09 NOTE — Op Note (Signed)
OPERATIVE REPORT  SURGEON: Samson FredericBrian Undra Trembath, MD   ASSISTANT: Staff.  PREOPERATIVE DIAGNOSIS: Right intertrochanteric femur fracture.   POSTOPERATIVE DIAGNOSIS: Right intertrochanteric femur fracture.   PROCEDURE: Intramedullary fixation, Right femur.   IMPLANTS: Biomet Affixus Hip Fracture Nail, 11 by 180 mm, 125 degrees. 10.5 x 100 mm Hip Fracture Nail Lag Screw. 5 x 36 mm distal interlocking screw 1.  ANESTHESIA:  Spinal  ESTIMATED BLOOD LOSS: 100 mL.  ANTIBIOTICS: 2 g Ancef.  DRAINS: None.  COMPLICATIONS: None.   CONDITION: PACU - hemodynamically stable.Marland Kitchen.   BRIEF CLINICAL NOTE: Shawn Stephenson is a 84 y.o. male who presented with an intertrochanteric femur fracture. The patient was admitted to the hospitalist service and underwent perioperative risk stratification and medical optimization. The risks, benefits, and alternatives to the procedure were explained, and the patient elected to proceed.  PROCEDURE IN DETAIL: Surgical site was marked by myself. The patient was taken to the operating room and anesthesia was induced on the bed. The [facility] and the nonoperative lower extremity was scissored underneath the operative side. The fracture was reduced with traction, internal rotation, and adduction. The hip was prepped and draped in the normal sterile surgical fashion. Timeout was called verifying side and site of surgery. Preop antibiotics were given with 60 minutes of beginning the procedure.  Fluoroscopy was used to define the patient's anatomy. A 4 cm incision was made just proximal to the tip of the greater trochanter. The awl was used to obtain the standard starting point for a trochanteric entry nail under fluoroscopic control. The guidepin was placed. The entry reamer was used to open the proximal femur.  On the back table, the nail was assembled onto the jig. The nail was placed into the femur without any difficulty. Through a  separate stab incision, the cannula was placed down to the bone in preparation for the cephalomedullary device. A guidepin was placed into the femoral head using AP and lateral fluoroscopy views. The pin was measured, and then reaming was performed to the appropriate depth. The lag screw was inserted to the appropriate depth. The fracture was compressed through the jig. The setscrew was tightened and then loosened one quarter turn. A separate stab incision was created, and the distal interlocking screw was placed using standard AO technique. The jig was removed. Final AP and lateral fluoroscopy views were obtained to confirm fracture reduction and hardware placement. Tip apex distance was appropriate. There was no chondral penetration.  The wounds were copiously irrigated with saline. The wound was closed in layers with #1 Vicryl for the fascia, 2-0 Monocryl for the deep dermal layer, and 3-0 Monocryl subcuticular stitch. Glue was applied to the skin. Once the glue was fully hardened, sterile dressing was applied. The patient was then awakened from anesthesia and taken to the PACU in stable condition. Sponge needle and instrument counts were correct at the end of the case 2. There were no known complications.  We will readmit the patient to the hospitalist. Weightbearing status will be weightbearing as tolerated with a walker. We will begin Lovenox for DVT prophylaxis. The patient will work with physical therapy and undergo disposition planning.

## 2017-12-09 NOTE — H&P (Signed)
TRH H&P   Patient Demographics:    Shawn Stephenson, is a 84 y.o. male  MRN: [id number]   DOB - [birth year]  Admit Date - 12/09/2017  Outpatient Primary MD for the patient is Asencion Noble, MD  Referring MD: Dr. Roderic Palau Outpatient Specialists: Dr. Martinique (cardiology)   Patient coming from: Home  Chief Complaint  Patient presents with  . Fall      HPI:    Shawn Stephenson  is a 84 y.o. male, history of hypertensive heart disease and history of diastolic dysfunction, hyperlipidemia and diabetes mellitus on oral hypoglycemic who follows with [facility] cardiology every 6-12 months resented to the ED with fall at home.  Patient was moving both from the shelf in his garage into a box when he stood up from a sitting position and felt like he was going to fall in the next thing he remembers he was on the floor.  He does not think he passed out and landed on his buttocks.  He had severe pain in his right hip and was unable to get up.  Denies any recent illness, fevers, chills, headache, blurred vision, bowel or urinary incontinence, chest pain, palpitations, shortness of breath, abdominal pain, tingling or numbness of his extremities.  He reports having a good meal today and keeping himself hydrated.  Denies any prior falls.  Denies any travel or sick contacts.  Denies any change in his medications.  At baseline he is quite active.  Hospital course In the ED vitals were stable.  Blood work showed normal CBC and chemistry.  Blood glucose was elevated at 257.  Mildly elevated BUN of 25.  X-ray of the hip showed displaced right intertrochanteric fracture.  Chest x-ray was unremarkable.  EKG pending. [facility] orthopedics orthopedic surgeon Dr. Delfino Lovett consulted by ED physician who recommended transfer to [facility].    Review of systems:    In addition to the HPI above,  No  Fever-chills, No Headache, No changes with Vision or hearing, No problems swallowing food or Liquids, No Chest pain, Cough or Shortness of Breath, No Abdominal pain, No Nausea or vomiting, Bowel movements are regular, No Blood in stool or Urine, No dysuria, No new skin rashes or bruises, No new joints pains-aches,  No new weakness, tingling, numbness in any extremity, No recent weight gain or loss, No polyuria, polydypsia or polyphagia, No significant Mental Stressors.     With Past History of the following :    Past Medical History:  Diagnosis Date  . Asymptomatic PVCs   . Diabetes mellitus    well controlled  . Diastolic dysfunction   . Hyperlipidemia   . Hypertension   . Mitral regurgitation    mild      History reviewed. No pertinent surgical history.    Social History:     Social History   Tobacco Use  .  Smoking status: Never Smoker  . Smokeless tobacco: Never Used  Substance Use Topics  . Alcohol use: No     Lives -home with wife  Mobility -independent     Family History :     Family History  Problem Relation Age of Onset  . Stroke Father   . Diabetes Father   . Hypertension Father   . Hypertension Mother   . Arthritis Mother   . Hypertension Brother   . Stroke Brother   . Kidney disease Brother       Home Medications:   Prior to Admission medications   Medication Sig Start Date End Date Taking? Authorizing Provider  ACCU-CHEK AVIVA PLUS test strip  04/29/14  Yes [provider]  ACCU-CHEK SOFTCLIX LANCETS lancets  04/29/14  Yes [provider]  amLODipine (NORVASC) 10 MG tablet Take 1 tablet (10 mg total) by mouth daily. 11/18/14  Yes Darlin Coco, MD  aspirin 81 MG tablet Take 81 mg by mouth daily.     Yes [provider]  atorvastatin (LIPITOR) 10 MG tablet TAKE ONE-HALF TABLET BY MOUTH DAILY 10/31/17  Yes Martinique, Peter M, MD  Blood Glucose Monitoring Suppl (ACCU-CHEK AVIVA PLUS) W/DEVICE KIT   04/29/14  Yes [provider]  carvedilol (COREG) 6.25 MG tablet Take 1 tablet (6.25 mg total) by mouth 2 (two) times daily with a meal. 10/10/17  Yes Martinique, Peter M, MD  Cholecalciferol (VITAMIN D-3) 1000 UNITS CAPS Take 1,000 Units by mouth daily.   Yes [provider]  Cyanocobalamin (VITAMIN B 12 PO) Take 1,000 mcg by mouth daily.    Yes [provider]  fexofenadine (ALLEGRA) 180 MG tablet Take 180 mg by mouth daily.   Yes [provider]  glipiZIDE (GLUCOTROL) 5 MG 24 hr tablet Take 5 mg by mouth daily.     Yes [provider]  glucosamine-chondroitin 500-400 MG tablet Take 1 tablet by mouth 3 (three) times daily.   Yes [provider]  hydrochlorothiazide (HYDRODIURIL) 25 MG tablet Take 1 tablet (25 mg total) by mouth daily. 08/23/13  Yes Burtis Junes, NP  latanoprost (XALATAN) 0.005 % ophthalmic solution Place 1 drop into the right eye daily. 10/21/17  Yes [provider]  MAGNESIUM PO Take 400 mg by mouth daily.    Yes [provider]  Multiple Vitamins-Minerals (OCUVITE PRESERVISION PO) Take 1 tablet by mouth daily.   Yes [provider]  OVER THE COUNTER MEDICATION Take 2 capsules by mouth daily. Co Q 10 + NAC   Yes [provider]  OVER THE COUNTER MEDICATION Take 1 tablet by mouth 2 (two) times daily. Med Name: ANTIIVA   Yes [provider]  pyridOXINE (VITAMIN B-6) 50 MG tablet Take 25 mg by mouth daily.    Yes [provider]     Allergies:     Allergies  Allergen Reactions  . Enalapril Maleate Other (See Comments)    swelling  . Losartan Other (See Comments)    Makes him feel jittery  . Menthol Itching  . Vasotec Swelling  . Sulfa Antibiotics Other (See Comments)    Doesn't recall reaction  . Sulfamethoxazole Other (See Comments)    Doesn't recall reaction     Physical Exam:   Vitals  Blood pressure (!) 142/74, pulse 64, temperature 98.2 F (36.8 C),  temperature source Oral, resp. rate 15, height _0  (1.803 m), weight 74.8 kg (165 lb), SpO2 94 %.   General: Elderly male  lying in bed in no acute distress HEENT: Pupils reactive bilaterally, EOMI, no pallor, no icterus, moist oral mucosa, supple neck Chest: Clear to auscultation bilaterally, no added sounds CVS: Normal S1 and S2, no murmurs rub or gallop GI: Soft, nondistended, nontender, Foley placed in the ED Musculoskeletal : Warm, no edema, rotated right hip with limited range of motion, normal skin CNS: Alert and oriented    Data Review:    CBC Recent Labs  Lab 12/09/17 1136  WBC 5.7  HGB 14.6  HCT 43.2  PLT 154  MCV 92.7  MCH 31.3  MCHC 33.8  RDW 12.7  LYMPHSABS 1.1  MONOABS 0.3  EOSABS 0.1  BASOSABS 0.0   ------------------------------------------------------------------------------------------------------------------  Chemistries  Recent Labs  Lab 12/09/17 1136  NA 137  K 3.5  CL 100*  CO2 29  GLUCOSE 257*  BUN 25*  CREATININE 0.80  CALCIUM 9.0  AST 31  ALT 33  ALKPHOS 60  BILITOT 1.2   ------------------------------------------------------------------------------------------------------------------ estimated creatinine clearance is 70.1 mL/min (by C-G formula based on SCr of 0.8 mg/dL). ------------------------------------------------------------------------------------------------------------------ No results for input(s): TSH, T4TOTAL, T3FREE, THYROIDAB in the last 72 hours.  Invalid input(s): FREET3  Coagulation profile No results for input(s): INR, PROTIME in the last 168 hours. ------------------------------------------------------------------------------------------------------------------- No results for input(s): DDIMER in the last 72 hours. -------------------------------------------------------------------------------------------------------------------  Cardiac Enzymes No results for input(s): CKMB, TROPONINI, MYOGLOBIN in the  last 168 hours.  Invalid input(s): CK ------------------------------------------------------------------------------------------------------------------ No results found for: BNP   ---------------------------------------------------------------------------------------------------------------  Urinalysis No results found for: COLORURINE, APPEARANCEUR, LABSPEC, [place], GLUCOSEU, HGBUR, BILIRUBINUR, KETONESUR, PROTEINUR, UROBILINOGEN, NITRITE, LEUKOCYTESUR  ----------------------------------------------------------------------------------------------------------------   Imaging Results:    Dg Chest 1 View  Result Date: 12/09/2017 CLINICAL DATA:  Fall with right hip pain. EXAM: CHEST  1 VIEW COMPARISON:  None. FINDINGS: Lungs are adequately inflated without consolidation, effusion or pneumothorax. Cardiomediastinal silhouette is within normal. Mild degenerate change of the spine. IMPRESSION: No acute findings. Electronically Signed   By: Marin Olp M.D.   On: 12/09/2017 12:59   Ct Head Wo Contrast  Result Date: 12/09/2017 CLINICAL DATA:  84 year old fell on concrete floor. Altered level of consciousness. EXAM: CT HEAD WITHOUT CONTRAST TECHNIQUE: Contiguous axial images were obtained from the base of the skull through the vertex without intravenous contrast. COMPARISON:  Cervical spine CT 12/05/2006 FINDINGS: Brain: Diffuse cerebral edema. Low-density in the white matter suggesting chronic changes. No evidence for acute hemorrhage, mass lesion, midline shift, hydrocephalus or large infarct. Suspect old lacune infarcts in left basal ganglia. Vascular: No hyperdense vessel or unexpected calcification. Skull: Normal. Negative for fracture or focal lesion. Sinuses/Orbits: No acute finding. Nasal septum is deviated towards the left. Other: None IMPRESSION: No acute intracranial abnormality. Atrophy and evidence for chronic small vessel ischemic disease. Electronically Signed   By: Markus Daft M.D.    On: 12/09/2017 12:41   Dg Hip Unilat W Or Wo Pelvis 2-3 Views Right  Result Date: 12/09/2017 CLINICAL DATA:  Fall with right hip pain. EXAM: DG HIP (WITH OR WITHOUT PELVIS) 2-3V RIGHT COMPARISON:  None. FINDINGS: Exam demonstrates a displaced intertrochanteric fracture of the right hip. There is displacement of the lesser trochanter. Mild symmetric degenerative change of the hips. Degenerate change of the spine. IMPRESSION: Displaced right intertrochanteric fracture. Electronically Signed   By: Marin Olp M.D.   On: 12/09/2017 12:58    My personal review of EKG: Pending   Assessment & Plan:   Principal problem   Closed comminuted intertrochanteric fracture of proximal  femur, right, initial encounter ([place]) Suspect mechanical fall versus vasovagal syncope.  Hip fracture pathway initiated.  Patient will be transferred to [facility] for orthopedics evaluation and surgical repair.  Admit to telemetry. Pain control with PRN Vicodin and IV morphine.  PRN IV Robaxin for muscle spasms.  DVT prophylaxis with subcu Lovenox.  IV hydration with normal saline and potassium supplement.  Bowel regimen added.  Foley placed in the ED.  Preop clearance Patient has well-controlled diabetes on oral hypoglycemic and stable hypertensive heart disease without active heart failure and mild aortic insufficiency based on echo from 2015.  Patient is quite independent at baseline. As per Lyndel Safe risk assessment score he has a low (0.44%) estimated risk probability for perioperative myocardial infarction or cardiac arrest. Given his history of asymmetric septal hypertrophy and mild aortic insufficiency with unclear presentation of syncope I will obtain a 2D echo for further evaluation. Patient does not need any other preop cardiac evaluation for surgery.    Active Problems:   Benign hypertensive heart disease with stable diastolic dysfunction Euvolemic.  Blood pressure stable.  Continue beta-blocker.  Hold HCTZ as  patient appears mildly dehydrated.     Diabetes mellitus type II, controlled with hyperglycemia ([place]) CBG elevated possibly due to acute distress.  Hold glipizide and monitor on sliding scale insulin every 4 hours.  Asymmetric septal hypertrophy/mild aortic insufficiency   Follows with [place] every 6 months (last seen in April 2019)      DVT Prophylaxis subcu Lovenox  AM Labs Ordered, also please review Full Orders  Family Communication: Admission, patients condition and plan of care including tests being ordered have been discussed with the patient and his wife at bedside. Signed out to my partner Dr Jeneen Rinks kim at [facility].  Code Status full code  Position: Possibly SNF postop  Condition fair  Consults called: Dr Delfino Lovett ( orthopedics)   Admission status: inpatient   Time spent in minutes : 50   Treavon Castilleja M.D on 12/09/2017 at 3:25 PM  Between 7am to 7pm - Pager - [phone]. After 7pm go to [email or web address] - password [facility]  [facility] - Office  [phone]

## 2017-12-09 NOTE — Anesthesia Preprocedure Evaluation (Addendum)
Anesthesia Evaluation  Patient identified by MRN, date of birth, ID band Patient awake    Reviewed: Allergy & Precautions, NPO status , Patient's Chart, lab work & pertinent test results  Airway Mallampati: I  TM Distance: >3 FB Neck ROM: Full    Dental  (+) Teeth Intact, Dental Advisory Given   Pulmonary    Pulmonary exam normal        Cardiovascular hypertension, Pt. on medications Normal cardiovascular exam     Neuro/Psych    GI/Hepatic   Endo/Other  diabetes, Type 2, Oral Hypoglycemic Agents  Renal/GU      Musculoskeletal   Abdominal   Peds  Hematology   Anesthesia Other Findings   Reproductive/Obstetrics                            Anesthesia Physical Anesthesia Plan  ASA: III  Anesthesia Plan: Spinal   Post-op Pain Management:    Induction: Intravenous  PONV Risk Score and Plan: 1 and Treatment may vary due to age or medical condition  Airway Management Planned: Simple Face Mask  Additional Equipment:   Intra-op Plan:   Post-operative Plan:   Informed Consent: I have reviewed the patients History and Physical, chart, labs and discussed the procedure including the risks, benefits and alternatives for the proposed anesthesia with the patient or authorized representative who has indicated his/her understanding and acceptance.     Plan Discussed with: CRNA and Surgeon  Anesthesia Plan Comments:         Anesthesia Quick Evaluation

## 2017-12-09 NOTE — Anesthesia Procedure Notes (Signed)
Spinal  Patient location during procedure: OR Start time: 12/09/2017 10:14 PM End time: 12/09/2017 10:16 PM Staffing Anesthesiologist: Arta Brucessey, Mariaisabel Bodiford, MD Performed: anesthesiologist  Preanesthetic Checklist Completed: patient identified, surgical consent, pre-op evaluation, timeout performed, IV checked, risks and benefits discussed and monitors and equipment checked Spinal Block Patient position: left lateral decubitus Prep: DuraPrep Patient monitoring: blood pressure, continuous pulse ox, cardiac monitor and heart rate Approach: right paramedian Location: L3-4 Injection technique: single-shot Needle Needle type: Pencan  Needle gauge: 24 G Needle length: 9 cm Needle insertion depth: 7 cm

## 2017-12-09 NOTE — ED Notes (Signed)
Report given to [facility]. 

## 2017-12-09 NOTE — Consult Note (Signed)
ORTHOPAEDIC CONSULTATION  REQUESTING PHYSICIAN: Louellen Molder, MD  PCP:  Asencion Noble, MD  Chief Complaint: Right hip pain, inability to weight-bear.  HPI: Shawn Stephenson is a 84 y.o. male who complains of right hip pain and inability to weight-bear after a ground-level fall earlier today.  He noticed immediate pain.  He was brought to the emergency department at [facility], where x-rays revealed a displaced right intertrochanteric femur fracture.  Due to his previous relationship with [facility] orthopedics, I was consulted for management of his fracture.  He was seen by the hospitalist for perioperative risk stratification and medical optimization.  He denies other injuries.  Past Medical History:  Diagnosis Date  . Asymptomatic PVCs   . Diabetes mellitus    well controlled  . Diastolic dysfunction   . Hyperlipidemia   . Hypertension   . Mitral regurgitation    mild   History reviewed. No pertinent surgical history. Social History   Socioeconomic History  . Marital status: Married    Spouse name: Not on file  . Number of children: Not on file  . Years of education: Not on file  . Highest education level: Not on file  Occupational History  . Not on file  Social Needs  . Financial resource strain: Not on file  . Food insecurity:    Worry: Not on file    Inability: Not on file  . Transportation needs:    Medical: Not on file    Non-medical: Not on file  Tobacco Use  . Smoking status: Never Smoker  . Smokeless tobacco: Never Used  Substance and Sexual Activity  . Alcohol use: No  . Drug use: No  . Sexual activity: Not on file  Lifestyle  . Physical activity:    Days per week: Not on file    Minutes per session: Not on file  . Stress: Not on file  Relationships  . Social connections:    Talks on phone: Not on file    Gets together: Not on file    Attends religious service: Not on file    Active member of club or organization: Not on file   Attends meetings of clubs or organizations: Not on file    Relationship status: Not on file  Other Topics Concern  . Not on file  Social History Narrative  . Not on file   Family History  Problem Relation Age of Onset  . Stroke Father   . Diabetes Father   . Hypertension Father   . Hypertension Mother   . Arthritis Mother   . Hypertension Brother   . Stroke Brother   . Kidney disease Brother    Allergies  Allergen Reactions  . Enalapril Maleate Other (See Comments)    swelling  . Losartan Other (See Comments)    Makes him feel jittery  . Menthol Itching  . Vasotec Swelling  . Sulfa Antibiotics Other (See Comments)    Doesn't recall reaction  . Sulfamethoxazole Other (See Comments)    Doesn't recall reaction   Prior to Admission medications   Medication Sig Start Date End Date Taking? Authorizing Provider  ACCU-CHEK AVIVA PLUS test strip  04/29/14  Yes [provider]  ACCU-CHEK SOFTCLIX LANCETS lancets  04/29/14  Yes [provider]  amLODipine (NORVASC) 10 MG tablet Take 1 tablet (10 mg total) by mouth daily. 11/18/14  Yes Darlin Coco, MD  aspirin 81 MG tablet Take 81 mg by mouth daily.  Yes [provider]  atorvastatin (LIPITOR) 10 MG tablet TAKE ONE-HALF TABLET BY MOUTH DAILY 10/31/17  Yes Martinique, Peter M, MD  Blood Glucose Monitoring Suppl (ACCU-CHEK AVIVA PLUS) W/DEVICE KIT  04/29/14  Yes [provider]  carvedilol (COREG) 6.25 MG tablet Take 1 tablet (6.25 mg total) by mouth 2 (two) times daily with a meal. 10/10/17  Yes Martinique, Peter M, MD  Cholecalciferol (VITAMIN D-3) 1000 UNITS CAPS Take 1,000 Units by mouth daily.   Yes [provider]  Cyanocobalamin (VITAMIN B 12 PO) Take 1,000 mcg by mouth daily.    Yes [provider]  fexofenadine (ALLEGRA) 180 MG tablet Take 180 mg by mouth daily.   Yes [provider]  glipiZIDE (GLUCOTROL) 5 MG 24 hr tablet Take 5 mg by mouth daily.     Yes [provider]  glucosamine-chondroitin 500-400 MG tablet Take 1 tablet by mouth 3 (three) times daily.   Yes [provider]  hydrochlorothiazide (HYDRODIURIL) 25 MG tablet Take 1 tablet (25 mg total) by mouth daily. 08/23/13  Yes Burtis Junes, NP  latanoprost (XALATAN) 0.005 % ophthalmic solution Place 1 drop into the right eye daily. 10/21/17  Yes [provider]  MAGNESIUM PO Take 400 mg by mouth daily.    Yes [provider]  Multiple Vitamins-Minerals (OCUVITE PRESERVISION PO) Take 1 tablet by mouth daily.   Yes [provider]  OVER THE COUNTER MEDICATION Take 2 capsules by mouth daily. Co Q 10 + NAC   Yes [provider]  OVER THE COUNTER MEDICATION Take 1 tablet by mouth 2 (two) times daily. Med Name: ANTIIVA   Yes [provider]  pyridOXINE (VITAMIN B-6) 50 MG tablet Take 25 mg by mouth daily.    Yes [provider]   Dg Chest 1 View  Result Date: 12/09/2017 CLINICAL DATA:  Fall with right hip pain. EXAM: CHEST  1 VIEW COMPARISON:  None. FINDINGS: Lungs are adequately inflated without consolidation, effusion or pneumothorax. Cardiomediastinal silhouette is within normal. Mild degenerate change of the spine. IMPRESSION: No acute findings. Electronically Signed   By: Marin Olp M.D.   On: 12/09/2017 12:59   Ct Head Wo Contrast  Result Date: 12/09/2017 CLINICAL DATA:  84 year old fell on concrete floor. Altered level of consciousness. EXAM: CT HEAD WITHOUT CONTRAST TECHNIQUE: Contiguous axial images were obtained from the base of the skull through the vertex without intravenous contrast. COMPARISON:  Cervical spine CT 12/05/2006 FINDINGS: Brain: Diffuse cerebral edema. Low-density in the white matter suggesting chronic changes. No evidence for acute hemorrhage, mass lesion, midline shift, hydrocephalus or large infarct. Suspect old lacune infarcts in left basal ganglia. Vascular: No hyperdense vessel or unexpected  calcification. Skull: Normal. Negative for fracture or focal lesion. Sinuses/Orbits: No acute finding. Nasal septum is deviated towards the left. Other: None IMPRESSION: No acute intracranial abnormality. Atrophy and evidence for chronic small vessel ischemic disease. Electronically Signed   By: Markus Daft M.D.   On: 12/09/2017 12:41   Dg Knee Right Port  Result Date: 12/09/2017 CLINICAL DATA:  Hip fracture EXAM: PORTABLE RIGHT KNEE - 1-2 VIEW COMPARISON:  12/09/2017 FINDINGS: No acute displaced fracture or malalignment. Medial femoral hemi arthroplasty with intact hardware. Surface irregularity and sclerosis of the proximal, medial tibia. Moderate degenerative change of the patellofemoral and lateral joint space compartments with joint space calcification. Trace knee effusion. Vascular calcification. IMPRESSION: 1. No acute osseous abnormality. 2. Postsurgical changes medial joint with articular surface irregularity and  sclerosis of the medial proximal tibia. 3. Chondrocalcinosis.  Moderate arthritis of the knee. Electronically Signed   By: Donavan Foil M.D.   On: 12/09/2017 19:29   Dg Hip Unilat W Or Wo Pelvis 2-3 Views Right  Result Date: 12/09/2017 CLINICAL DATA:  Fall with right hip pain. EXAM: DG HIP (WITH OR WITHOUT PELVIS) 2-3V RIGHT COMPARISON:  None. FINDINGS: Exam demonstrates a displaced intertrochanteric fracture of the right hip. There is displacement of the lesser trochanter. Mild symmetric degenerative change of the hips. Degenerate change of the spine. IMPRESSION: Displaced right intertrochanteric fracture. Electronically Signed   By: Marin Olp M.D.   On: 12/09/2017 12:58    Positive ROS: All other systems have been reviewed and were otherwise negative with the exception of those mentioned in the HPI and as above.  Physical Exam: General: Alert, no acute distress Cardiovascular: No pedal edema Respiratory: No cyanosis, no use of accessory musculature GI: No organomegaly,  abdomen is soft and non-tender Skin: No lesions in the area of chief complaint Neurologic: Sensation intact distally Psychiatric: Patient is competent for consent with normal mood and affect Lymphatic: No axillary or cervical lymphadenopathy  MUSCULOSKELETAL: Examination the right lower extremity reveals no skin wounds or lesions over the hip.  He has shortening and rotation.  He has pain with attempted logrolling.  He has palpable pulses.  He has intact dorsiflexion, plantarflexion, and great toe extension with strength limited by pain.  He reports intact sensation.  Assessment: Displaced right intertrochanteric femur fracture.  Plan: I discussed the findings to the patient and his family.  We discussed intramedullary fixation of his fracture.  We discussed the risks, benefits, and alternatives.  Please see statement of risk.  Postoperatively, we will have him work with physical therapy, and he will likely be able to discharge home with home health physical therapy.  All questions were solicited and answered.  The risks, benefits, and alternatives were discussed with the patient. There are risks associated with the surgery including, but not limited to, problems with anesthesia (death), infection, differences in leg length/angulation/rotation, fracture of bones, loosening or failure of implants, malunion, nonunion, hematoma (blood accumulation) which may require surgical drainage, blood clots, pulmonary embolism, nerve injury (foot drop), and blood vessel injury. The patient understands these risks and elects to proceed.   Bertram Savin, MD Cell [phone]    12/09/2017 10:01 PM

## 2017-12-09 NOTE — ED Provider Notes (Addendum)
[facility] EMERGENCY DEPARTMENT Provider Note   CSN: [id number] Arrival date & time: 12/09/17  1031     History   Chief Complaint Chief Complaint  Patient presents with  . Fall    HPI Shawn Stephenson is a 84 y.o. male.  Patient states that he fell today.  Patient complains of right hip pain.  Patient also hit his head but no loss of consciousness  The history is provided by the patient.  Fall  This is a new problem. The current episode started 1 to 2 hours ago. The problem occurs rarely. The problem has been resolved. Pertinent negatives include no chest pain, no abdominal pain and no headaches. Nothing aggravates the symptoms. Nothing relieves the symptoms. The treatment provided no relief.    Past Medical History:  Diagnosis Date  . Asymptomatic PVCs   . Diabetes mellitus    well controlled  . Diastolic dysfunction   . Hyperlipidemia   . Hypertension   . Mitral regurgitation    mild    Patient Active Problem List   Diagnosis Date Noted  . Closed comminuted intertrochanteric fracture of proximal femur, right, initial encounter ([place]) 12/09/2017  . Asymmetric septal hypertrophy ([place]) 05/19/2014  . Mild aortic insufficiency 01/15/2013  . Asymptomatic PVCs 06/01/2011  . Benign hypertensive heart disease without heart failure 11/24/2010  . Diabetes mellitus 11/24/2010  . Hypercholesterolemia 11/24/2010  . Diastolic dysfunction, left ventricle 11/24/2010    History reviewed. No pertinent surgical history.      Home Medications    Prior to Admission medications   Medication Sig Start Date End Date Taking? Authorizing Provider  ACCU-CHEK AVIVA PLUS test strip  04/29/14   [provider]  ACCU-CHEK SOFTCLIX LANCETS lancets  04/29/14   [provider]  amLODipine (NORVASC) 10 MG tablet Take 1 tablet (10 mg total) by mouth daily. 11/18/14   Darlin Coco, MD  aspirin 81 MG tablet Take 81 mg by mouth daily.      [provider]    atorvastatin (LIPITOR) 10 MG tablet TAKE ONE-HALF TABLET BY MOUTH DAILY 10/31/17   Martinique, Peter M, MD  Blood Glucose Monitoring Suppl (ACCU-CHEK AVIVA PLUS) W/DEVICE KIT  04/29/14   [provider]  carvedilol (COREG) 6.25 MG tablet Take 1 tablet (6.25 mg total) by mouth 2 (two) times daily with a meal. 10/10/17   [place], Peter M, MD  Cholecalciferol (VITAMIN D-3) 1000 UNITS CAPS Take 1,000 Units by mouth daily.    [provider]  Cyanocobalamin (VITAMIN B 12 PO) Take 1,000 mcg by mouth daily.     [provider]  fexofenadine (ALLEGRA) 180 MG tablet Take 180 mg by mouth daily.    [provider]  glipiZIDE (GLUCOTROL) 5 MG 24 hr tablet Take 5 mg by mouth daily.      [provider]  glucosamine-chondroitin 500-400 MG tablet Take 1 tablet by mouth 3 (three) times daily.    [provider]  hydrochlorothiazide (HYDRODIURIL) 25 MG tablet Take 1 tablet (25 mg total) by mouth daily. 08/23/13   Burtis Junes, NP  MAGNESIUM PO Take 400 mg by mouth daily.     [provider]  Multiple Vitamins-Minerals (OCUVITE PRESERVISION PO) Take 1 tablet by mouth daily.    [provider]  OVER THE COUNTER MEDICATION Take 2 capsules by mouth daily. Co Q 10 + NAC    [provider]  OVER THE COUNTER MEDICATION Take 1 tablet by mouth 2 (two) times daily.  Med Name: ANTIIVA    [provider]  pyridOXINE (VITAMIN B-6) 50 MG tablet Take 50 mg by mouth daily.    [provider]    Family History Family History  Problem Relation Age of Onset  . Stroke Father   . Diabetes Father   . Hypertension Father   . Hypertension Mother   . Arthritis Mother   . Hypertension Brother   . Stroke Brother   . Kidney disease Brother     Social History Social History   Tobacco Use  . Smoking status: Never Smoker  . Smokeless tobacco: Never Used  Substance Use Topics  . Alcohol use: No  . Drug use: No     Allergies    Enalapril maleate; Losartan; Menthol; Vasotec; Sulfa antibiotics; and Sulfamethoxazole   Review of Systems Review of Systems  Constitutional: Negative for appetite change and fatigue.  HENT: Negative for congestion, ear discharge and sinus pressure.   Eyes: Negative for discharge.  Respiratory: Negative for cough.   Cardiovascular: Negative for chest pain.  Gastrointestinal: Negative for abdominal pain and diarrhea.  Genitourinary: Negative for frequency and hematuria.  Musculoskeletal: Negative for back pain.       Right hip pain  Skin: Negative for rash.  Neurological: Negative for seizures and headaches.  Psychiatric/Behavioral: Negative for hallucinations.     Physical Exam Updated Vital Signs BP (!) 142/74   Pulse 64   Temp 98.2 F (36.8 C) (Oral)   Resp 15   Ht 5' 11" (1.803 m)   Wt 74.8 kg (165 lb)   SpO2 94%   BMI 23.01 kg/m   Physical Exam  Constitutional: He is oriented to person, place, and time. He appears well-developed.  HENT:  Head: Normocephalic.  Eyes: Conjunctivae and EOM are normal. No scleral icterus.  Neck: Neck supple. No thyromegaly present.  Cardiovascular: Normal rate and regular rhythm. Exam reveals no gallop and no friction rub.  No murmur heard. Pulmonary/Chest: No stridor. He has no wheezes. He has no rales. He exhibits no tenderness.  Abdominal: He exhibits no distension. There is no tenderness. There is no rebound.  Musculoskeletal: Normal range of motion. He exhibits no edema.  Tender right hip  Lymphadenopathy:    He has no cervical adenopathy.  Neurological: He is oriented to person, place, and time. He exhibits normal muscle tone. Coordination normal.  Skin: No rash noted. No erythema.  Psychiatric: He has a normal mood and affect. His behavior is normal.     ED Treatments / Results  Labs (all labs ordered are listed, but only abnormal results are displayed) Labs Reviewed  COMPREHENSIVE METABOLIC PANEL - Abnormal; Notable  for the following components:      Result Value   Chloride 100 (*)    Glucose, Bld 257 (*)    BUN 25 (*)    All other components within normal limits  CBC WITH DIFFERENTIAL/PLATELET    EKG None  Radiology Dg Chest 1 View  Result Date: 12/09/2017 CLINICAL DATA:  Fall with right hip pain. EXAM: CHEST  1 VIEW COMPARISON:  None. FINDINGS: Lungs are adequately inflated without consolidation, effusion or pneumothorax. Cardiomediastinal silhouette is within normal. Mild degenerate change of the spine. IMPRESSION: No acute findings. Electronically Signed   By: Marin Olp M.D.   On: 12/09/2017 12:59   Ct Head Wo Contrast  Result Date: 12/09/2017 CLINICAL DATA:  84 year old fell on concrete floor. Altered level of consciousness. EXAM: CT HEAD WITHOUT CONTRAST TECHNIQUE: Contiguous axial images  were obtained from the base of the skull through the vertex without intravenous contrast. COMPARISON:  Cervical spine CT 12/05/2006 FINDINGS: Brain: Diffuse cerebral edema. Low-density in the white matter suggesting chronic changes. No evidence for acute hemorrhage, mass lesion, midline shift, hydrocephalus or large infarct. Suspect old lacune infarcts in left basal ganglia. Vascular: No hyperdense vessel or unexpected calcification. Skull: Normal. Negative for fracture or focal lesion. Sinuses/Orbits: No acute finding. Nasal septum is deviated towards the left. Other: None IMPRESSION: No acute intracranial abnormality. Atrophy and evidence for chronic small vessel ischemic disease. Electronically Signed   By: Markus Daft M.D.   On: 12/09/2017 12:41   Dg Hip Unilat W Or Wo Pelvis 2-3 Views Right  Result Date: 12/09/2017 CLINICAL DATA:  Fall with right hip pain. EXAM: DG HIP (WITH OR WITHOUT PELVIS) 2-3V RIGHT COMPARISON:  None. FINDINGS: Exam demonstrates a displaced intertrochanteric fracture of the right hip. There is displacement of the lesser trochanter. Mild symmetric degenerative change of the hips.  Degenerate change of the spine. IMPRESSION: Displaced right intertrochanteric fracture. Electronically Signed   By: Marin Olp M.D.   On: 12/09/2017 12:58    Procedures Procedures (including critical care time)  Medications Ordered in ED Medications  HYDROmorphone (DILAUDID) injection 0.5 mg (0.5 mg Intravenous Given 12/09/17 1146)  ondansetron (ZOFRAN) injection 4 mg (4 mg Intravenous Given 12/09/17 1144)     Initial Impression / Assessment and Plan / ED Course  I have reviewed the triage vital signs and the nursing notes.  Pertinent labs & imaging results that were available during my care of the patient were reviewed by me and considered in my medical decision making (see chart for details).     Patient has a inotropic fracture of his right hip.  The patient prefers to be taking care of by [facility] in [place].  He is a patient of Dr. Charlestine Night.  I spoke with Dr. Lyla Glassing and he is willing to see the patient in consult with the hospitalist admitting over at [facility].  Patient will be transferred there today  Final Clinical Impressions(s) / ED Diagnoses   Final diagnoses:  Fall, initial encounter    ED Discharge Orders    None       Milton Ferguson, MD 12/09/17 1507    Milton Ferguson, MD 12/09/17 1606

## 2017-12-09 NOTE — Discharge Instructions (Signed)
 Dr. Katalaya Beel Adult Hip [facility] Knee Specialist [facility] [address] [place],  [address] [phone]   POSTOPERATIVE DIRECTIONS    Hip Rehabilitation, Guidelines Following Surgery   WEIGHT BEARING Weight bearing as tolerated with assist device (walker, cane, etc) as directed, use it as long as suggested by your surgeon or therapist, typically at least 4-6 weeks.   HOME CARE INSTRUCTIONS  Remove items at home which could result in a fall. This includes throw rugs or furniture in walking pathways.  Continue medications as instructed at time of discharge.  You may have some home medications which will be placed on hold until you complete the course of blood thinner medication.  4 days after discharge, you may start showering. No tub baths or soaking your incisions. Do not put on socks or shoes without following the instructions of your caregivers.   Sit on chairs with arms. Use the chair arms to help push yourself up when arising.  Arrange for the use of a toilet seat elevator so you are not sitting low.   Walk with walker as instructed.  You may resume a sexual relationship in one month or when given the OK by your caregiver.  Use walker as long as suggested by your caregivers.  Avoid periods of inactivity such as sitting longer than an hour when not asleep. This helps prevent blood clots.  You may return to work once you are cleared by your surgeon.  Do not drive a car for 6 weeks or until released by your surgeon.  Do not drive while taking narcotics.  Wear elastic stockings for two weeks following surgery during the day but you may remove then at night.  Make sure you keep all of your appointments after your operation with all of your doctors and caregivers. You should call the office at the above phone number and make an appointment for approximately two weeks after the date of your surgery. Please pick up a stool softener and laxative  for home use as long as you are requiring pain medications.  ICE to the affected hip every three hours for 30 minutes at a time and then as needed for pain and swelling. Continue to use ice on the hip for pain and swelling from surgery. You may notice swelling that will progress down to the foot and ankle.  This is normal after surgery.  Elevate the leg when you are not up walking on it.   It is important for you to complete the blood thinner medication as prescribed by your doctor.  Continue to use the breathing machine which will help keep your temperature down.  It is common for your temperature to cycle up and down following surgery, especially at night when you are not up moving around and exerting yourself.  The breathing machine keeps your lungs expanded and your temperature down.  RANGE OF MOTION AND STRENGTHENING EXERCISES  These exercises are designed to help you keep full movement of your hip joint. Follow your caregiver's or physical therapist's instructions. Perform all exercises about fifteen times, three times per day or as directed. Exercise both hips, even if you have had only one joint replacement. These exercises can be done on a training (exercise) mat, on the floor, on a table or on a bed. Use whatever works the best and is most comfortable for you. Use music or television while you are exercising so that the exercises are a pleasant break in your day. This   will make your life better with the exercises acting as a break in routine you can look forward to.  Lying on your back, slowly slide your foot toward your buttocks, raising your knee up off the floor. Then slowly slide your foot back down until your leg is straight again.  Lying on your back spread your legs as far apart as you can without causing discomfort.  Lying on your side, raise your upper leg and foot straight up from the floor as far as is comfortable. Slowly lower the leg and repeat.  Lying on your back, tighten up the  muscle in the front of your thigh (quadriceps muscles). You can do this by keeping your leg straight and trying to raise your heel off the floor. This helps strengthen the largest muscle supporting your knee.  Lying on your back, tighten up the muscles of your buttocks both with the legs straight and with the knee bent at a comfortable angle while keeping your heel on the floor.   SKILLED REHAB INSTRUCTIONS: If the patient is transferred to a skilled rehab facility following release from the hospital, a list of the current medications will be sent to the facility for the patient to continue.  When discharged from the skilled rehab facility, please have the facility set up the patient's Home Health Physical Therapy prior to being released. Also, the skilled facility will be responsible for providing the patient with their medications at time of release from the facility to include their pain medication and their blood thinner medication. If the patient is still at the rehab facility at time of the two week follow up appointment, the skilled rehab facility will also need to assist the patient in arranging follow up appointment in our office and any transportation needs.  MAKE SURE YOU:  Understand these instructions.  Will watch your condition.  Will get help right away if you are not doing well or get worse.  Pick up stool softner and laxative for home use following surgery while on pain medications. Daily dry dressing changes as needed. In 4 days, you may remove your dressings and begin taking showers - no tub baths or soaking the incisions. Continue to use ice for pain and swelling after surgery. Do not use any lotions or creams on the incision until instructed by your surgeon.   

## 2017-12-09 NOTE — Anesthesia Procedure Notes (Signed)
Procedure Name: MAC Date/Time: 12/09/2017 10:16 PM Performed by: Suzy Bouchard, CRNA Pre-anesthesia Checklist: Patient identified, Suction available, Timeout performed, Patient being monitored and Emergency Drugs available Patient Re-evaluated:Patient Re-evaluated prior to induction Oxygen Delivery Method: Simple face mask

## 2017-12-09 NOTE — ED Triage Notes (Signed)
Pt c/o fall on concrete basement floor. Pt unsure of reason for fall. Denies LOC or hitting head. C/o RT hip/pelvic pain. UTA for deformity due to patient position.

## 2017-12-09 NOTE — Transfer of Care (Signed)
Immediate Anesthesia Transfer of Care Note  Patient: Shawn Stephenson  Procedure(s) Performed: INTRAMEDULLARY (IM) NAIL INTERTROCHANTRIC (Right Hip)  Patient Location: PACU  Anesthesia Type:Spinal  Level of Consciousness: awake and alert   Airway & Oxygen Therapy: Patient Spontanous Breathing  Post-op Assessment: Report given to RN and Post -op Vital signs reviewed and stable  Post vital signs: Reviewed and stable  Last Vitals:  Vitals Value Taken Time  BP 112/56 12/09/2017 11:57 PM  Temp    Pulse 54 12/09/2017 11:59 PM  Resp 8 12/09/2017 11:59 PM  SpO2 98 % 12/09/2017 11:59 PM  Vitals shown include unvalidated device data.  Last Pain:  Vitals:   12/09/17 2033  TempSrc: Oral  PainSc:          Complications: No apparent anesthesia complications.  Spinal level T10

## 2017-12-10 ENCOUNTER — Inpatient Hospital Stay (HOSPITAL_COMMUNITY): Payer: Medicare Other

## 2017-12-10 DIAGNOSIS — I519 Heart disease, unspecified: Secondary | ICD-10-CM

## 2017-12-10 DIAGNOSIS — S72141D Displaced intertrochanteric fracture of right femur, subsequent encounter for closed fracture with routine healing: Secondary | ICD-10-CM

## 2017-12-10 DIAGNOSIS — I5030 Unspecified diastolic (congestive) heart failure: Secondary | ICD-10-CM

## 2017-12-10 DIAGNOSIS — I422 Other hypertrophic cardiomyopathy: Secondary | ICD-10-CM

## 2017-12-10 DIAGNOSIS — E0865 Diabetes mellitus due to underlying condition with hyperglycemia: Secondary | ICD-10-CM

## 2017-12-10 LAB — GLUCOSE, CAPILLARY
GLUCOSE-CAPILLARY: 192 mg/dL — AB (ref 65–99)
GLUCOSE-CAPILLARY: 192 mg/dL — AB (ref 65–99)
GLUCOSE-CAPILLARY: 194 mg/dL — AB (ref 65–99)
Glucose-Capillary: 131 mg/dL — ABNORMAL HIGH (ref 65–99)
Glucose-Capillary: 145 mg/dL — ABNORMAL HIGH (ref 65–99)
Glucose-Capillary: 152 mg/dL — ABNORMAL HIGH (ref 65–99)
Glucose-Capillary: 182 mg/dL — ABNORMAL HIGH (ref 65–99)
Glucose-Capillary: 187 mg/dL — ABNORMAL HIGH (ref 65–99)

## 2017-12-10 LAB — ECHOCARDIOGRAM COMPLETE
Height: 71 in
WEIGHTICAEL: 2640 [oz_av]

## 2017-12-10 MED ORDER — INSULIN ASPART 100 UNIT/ML ~~LOC~~ SOLN
0.0000 [IU] | Freq: Three times a day (TID) | SUBCUTANEOUS | Status: DC
  Administered 2017-12-10: 2 [IU] via SUBCUTANEOUS
  Administered 2017-12-11: 3 [IU] via SUBCUTANEOUS
  Administered 2017-12-11: 1 [IU] via SUBCUTANEOUS
  Administered 2017-12-11 – 2017-12-12 (×2): 2 [IU] via SUBCUTANEOUS
  Administered 2017-12-12: 1 [IU] via SUBCUTANEOUS

## 2017-12-10 MED ORDER — DOCUSATE SODIUM 100 MG PO CAPS
100.0000 mg | ORAL_CAPSULE | Freq: Two times a day (BID) | ORAL | Status: DC
  Administered 2017-12-10 – 2017-12-12 (×4): 100 mg via ORAL
  Filled 2017-12-10 (×4): qty 1

## 2017-12-10 MED ORDER — METOCLOPRAMIDE HCL 5 MG/ML IJ SOLN: 5.0000 mg | Freq: Three times a day (TID) | INTRAMUSCULAR | Status: DC | PRN

## 2017-12-10 MED ORDER — ONDANSETRON HCL 4 MG PO TABS: 4.0000 mg | ORAL_TABLET | Freq: Four times a day (QID) | ORAL | Status: DC | PRN

## 2017-12-10 MED ORDER — MEPERIDINE HCL 50 MG/ML IJ SOLN
INTRAMUSCULAR | Status: AC
Start: 2017-12-10 — End: 2017-12-10
  Administered 2017-12-10: 12.5 mg via INTRAVENOUS
  Filled 2017-12-10: qty 1

## 2017-12-10 MED ORDER — HYDROCHLOROTHIAZIDE 25 MG PO TABS
25.0000 mg | ORAL_TABLET | Freq: Every day | ORAL | Status: DC
  Administered 2017-12-10 – 2017-12-12 (×3): 25 mg via ORAL
  Filled 2017-12-10 (×3): qty 1

## 2017-12-10 MED ORDER — ONDANSETRON HCL 4 MG/2ML IJ SOLN: 4.0000 mg | Freq: Four times a day (QID) | INTRAMUSCULAR | Status: DC | PRN

## 2017-12-10 MED ORDER — CEFAZOLIN SODIUM-DEXTROSE 2-4 GM/100ML-% IV SOLN
2.0000 g | Freq: Four times a day (QID) | INTRAVENOUS | Status: AC
  Administered 2017-12-10 (×2): 2 g via INTRAVENOUS
  Filled 2017-12-10 (×2): qty 100

## 2017-12-10 MED ORDER — METOCLOPRAMIDE HCL 5 MG PO TABS: 5.0000 mg | ORAL_TABLET | Freq: Three times a day (TID) | ORAL | Status: DC | PRN

## 2017-12-10 MED ORDER — ENOXAPARIN SODIUM 40 MG/0.4ML ~~LOC~~ SOLN
40.0000 mg | SUBCUTANEOUS | Status: DC
  Administered 2017-12-10 – 2017-12-12 (×3): 40 mg via SUBCUTANEOUS
  Filled 2017-12-10 (×3): qty 0.4

## 2017-12-10 NOTE — Progress Notes (Signed)
  Echocardiogram 2D Echocardiogram has been performed.  Janalyn HarderWest, Camri Molloy R 12/10/2017, 11:05 AM

## 2017-12-10 NOTE — Progress Notes (Signed)
Patient and wife have stated they have a POA/living will paperwork completed, they choose not to supply it to our medical records department. Paperwork is at home.

## 2017-12-10 NOTE — Progress Notes (Signed)
   Subjective: 1 Day Post-Op Procedure(s) (LRB): INTRAMEDULLARY (IM) NAIL INTERTROCHANTRIC (Right)  Pt awake and alert Mild to moderate soreness in the right hip this morning Denies any new symptoms or issues Patient reports pain as mild.  Objective:   VITALS:   Vitals:   12/10/17 0138 12/10/17 0526  BP: 139/68 126/63  Pulse: 65 69  Resp: 16 15  Temp: 97.6 F (36.4 C) 97.9 F (36.6 C)  SpO2: 90% 91%    Right hip incisions healing well nv intact distally, has history of neuropathy bilaterally No rashes or edema  LABS Recent Labs    12/09/17 1136 12/09/17 1852  HGB 14.6 13.4  HCT 43.2 39.2  WBC 5.7 10.6*  PLT 154 155    Recent Labs    12/09/17 1136 12/09/17 1852  NA 137  --   K 3.5  --   BUN 25*  --   CREATININE 0.80 0.81  GLUCOSE 257*  --      Assessment/Plan: 1 Day Post-Op Procedure(s) (LRB): INTRAMEDULLARY (IM) NAIL INTERTROCHANTRIC (Right) PT/OT Pain management Pulmonary toilet    Brad Antonieta Ibaixon PA-C, [facility] now [facility]  [facility] [address], [place], [place] [address] Phone: [phone] [email or web address] Facebook  [employer]  LinkedIn  Twitter

## 2017-12-10 NOTE — Progress Notes (Signed)
Actual time 2353 pt. Taken to PACU RN.

## 2017-12-10 NOTE — Progress Notes (Signed)
PROGRESS NOTE                                                                                                                                                                                                             Patient Demographics:    Shawn Stephenson, is a 84 y.o. male, DOB - [birth year], ZOX:[id number]  Admit date - 12/09/2017   Admitting Physician Eddie NorthNishant Dhungel, MD  Outpatient Primary MD for the patient is Carylon PerchesFagan, Roy, MD  LOS - 1   Chief Complaint  Patient presents with  . Fall       Brief Narrative   84 y.o. male, history of hypertensive heart disease and history of diastolic dysfunction, hyperlipidemia and diabetes mellitus on oral hypoglycemic who follows with [facility] cardiology every 6-12 months, presents with mechanical fall, sustained right hip fracture,, status post surgical repair by Dr. Linna CapriceSwinteck   Subjective:    Shawn SealsKenneth Stephenson today has, No headache, No chest pain, No abdominal pain -reports right hip pain is controlled  Assessment  & Plan :    Active Problems:   Benign hypertensive heart disease without heart failure   Diabetes mellitus (HCC)   Diastolic dysfunction, left ventricle   Mild aortic insufficiency   Asymmetric septal hypertrophy (HCC)   Closed comminuted intertrochanteric fracture of proximal femur, right, initial encounter (HCC)   Diabetes mellitus with hyperglycemia (HCC)   Closed right femoral fracture (HCC)   Closed displaced intertrochanteric fracture of right femur (HCC)   Closed comminuted intertrochanteric fracture of proximal femur, right -Discussed with the patient, it does appear to be secondary to mechanical fall he denies any loss of consciousness or lightheadedness, . -Empiric input greatly appreciated, status post surgical repair,  -PT prophylaxis, supportive management per Ortho . -seen By PT, recommendation for home with home health    Chronic diastolic CHF  -Appears to be  euvolemic  Hypertension -  Continue beta-blocker and HCTZ , blood pressure is acceptable  Diabetes mellitus type II, controlled with hyperglycemia (HCC) -Hold glipizide and continue with insulin sliding scale before meals  Asymmetric septal hypertrophy/mild aortic insufficiency   [place] every 6 months (last seen in April 2019)  Hyperlipidemia -10 you with statin         Code Status : Full  Family Communication  : None at bedside  Disposition Plan  :  pending PT consult  Consults  :  ortho  Procedures  : (LRB): INTRAMEDULLARY (IM) NAIL INTERTROCHANTRIC (Right    DVT Prophylaxis  :  Lovenox  Lab Results  Component Value Date   PLT 155 12/09/2017    Antibiotics  :    Anti-infectives (From admission, onward)   Start     Dose/Rate Route Frequency Ordered Stop   12/10/17 0600  ceFAZolin (ANCEF) IVPB 2g/100 mL premix     2 g 200 mL/hr over 30 Minutes Intravenous To Short Stay 12/09/17 1946 12/09/17 2253   12/10/17 0400  ceFAZolin (ANCEF) IVPB 2g/100 mL premix     2 g 200 mL/hr over 30 Minutes Intravenous Every 6 hours 12/10/17 0123 12/10/17 1337   12/09/17 2030  ceFAZolin (ANCEF) IVPB 2g/100 mL premix  Status:  Discontinued     2 g 200 mL/hr over 30 Minutes Intravenous To Short Stay 12/09/17 1838 12/09/17 1946        Objective:   Vitals:   12/10/17 0100 12/10/17 0115 12/10/17 0138 12/10/17 0526  BP: 139/69 (!) 150/67 139/68 126/63  Pulse: 62 64 65 69  Resp: 14 13 16 15   Temp: 98.1 F (36.7 C)  97.6 F (36.4 C) 97.9 F (36.6 C)  TempSrc:   Oral Oral  SpO2: 99% 96% 90% 91%  Weight:   74.8 kg (165 lb)   Height:   5\' 11"  (1.803 m)     Wt Readings from Last 3 Encounters:  12/10/17 74.8 kg (165 lb)  09/19/17 75.5 kg (166 lb 6.4 oz)  08/12/16 75.8 kg (167 lb)     Intake/Output Summary (Last 24 hours) at 12/10/2017 1349 Last data filed at 12/10/2017 0600 Gross per 24 hour  Intake 1356.7 ml  Output 575 ml  Net 781.7 ml      Physical Exam  Awake Alert, Oriented X 3, No new F.N deficits, Normal affect  Symmetrical Chest wall movement, Good air movement bilaterally, CTAB RRR,No Gallops,Rubs or new Murmurs, No Parasternal Heave +ve B.Sounds, Abd Soft, No tenderness,  No rebound - guarding or rigidity. No Cyanosis, Clubbing or edema, neurovascular intact, right hip bandage looks dry, no bleeding or oozing    Data Review:    CBC Recent Labs  Lab 12/09/17 1136 12/09/17 1852  WBC 5.7 10.6*  HGB 14.6 13.4  HCT 43.2 39.2  PLT 154 155  MCV 92.7 92.0  MCH 31.3 31.5  MCHC 33.8 34.2  RDW 12.7 12.3  LYMPHSABS 1.1  --   MONOABS 0.3  --   EOSABS 0.1  --   BASOSABS 0.0  --     Chemistries  Recent Labs  Lab 12/09/17 1136 12/09/17 1852  NA 137  --   K 3.5  --   CL 100*  --   CO2 29  --   GLUCOSE 257*  --   BUN 25*  --   CREATININE 0.80 0.81  CALCIUM 9.0  --   MG 2.1  --   AST 31  --   ALT 33  --   ALKPHOS 60  --   BILITOT 1.2  --    ------------------------------------------------------------------------------------------------------------------ No results for input(s): CHOL, HDL, LDLCALC, TRIG, CHOLHDL, LDLDIRECT in the last 72 hours.  No results found for: HGBA1C ------------------------------------------------------------------------------------------------------------------ No results for input(s): TSH, T4TOTAL, T3FREE, THYROIDAB in the last 72 hours.  Invalid input(s): FREET3 ------------------------------------------------------------------------------------------------------------------ No results for input(s): VITAMINB12, FOLATE, FERRITIN, TIBC, IRON, RETICCTPCT in the last 72 hours.  Coagulation profile No results for input(s):  INR, PROTIME in the last 168 hours.  No results for input(s): DDIMER in the last 72 hours.  Cardiac Enzymes No results for input(s): CKMB, TROPONINI, MYOGLOBIN in the last 168 hours.  Invalid input(s):  CK ------------------------------------------------------------------------------------------------------------------ No results found for: BNP  Inpatient Medications  Scheduled Meds: . amLODipine  10 mg Oral Daily  . aspirin  81 mg Oral Daily  . atorvastatin  5 mg Oral Daily  . carvedilol  6.25 mg Oral BID WC  . cholecalciferol  1,000 Units Oral Daily  . docusate sodium  100 mg Oral BID  . enoxaparin (LOVENOX) injection  40 mg Subcutaneous Q24H  . hydrochlorothiazide  25 mg Oral Daily  . insulin aspart  0-9 Units Subcutaneous Q4H  . latanoprost  1 drop Right Eye QHS  . loratadine  10 mg Oral Daily  . magnesium oxide  400 mg Oral Daily  . senna  1 tablet Oral BID  . vitamin B-12  1,000 mcg Oral Daily  . pyridOXINE  25 mg Oral Daily   Continuous Infusions: . 0.9 % NaCl with KCl 20 mEq / L 75 mL/hr at 12/10/17 0133  . methocarbamol (ROBAXIN)  IV     PRN Meds:.HYDROcodone-acetaminophen, methocarbamol **OR** methocarbamol (ROBAXIN)  IV, metoCLOPramide **OR** metoCLOPramide (REGLAN) injection, morphine injection, ondansetron **OR** ondansetron (ZOFRAN) IV  Micro Results No results found for this or any previous visit (from the past 240 hour(s)).  Radiology Reports Dg Chest 1 View  Result Date: 12/09/2017 CLINICAL DATA:  Fall with right hip pain. EXAM: CHEST  1 VIEW COMPARISON:  None. FINDINGS: Lungs are adequately inflated without consolidation, effusion or pneumothorax. Cardiomediastinal silhouette is within normal. Mild degenerate change of the spine. IMPRESSION: No acute findings. Electronically Signed   By: Elberta Fortis M.D.   On: 12/09/2017 12:59   Ct Head Wo Contrast  Result Date: 12/09/2017 CLINICAL DATA:  84 year old fell on concrete floor. Altered level of consciousness. EXAM: CT HEAD WITHOUT CONTRAST TECHNIQUE: Contiguous axial images were obtained from the base of the skull through the vertex without intravenous contrast. COMPARISON:  Cervical spine CT 12/05/2006  FINDINGS: Brain: Diffuse cerebral edema. Low-density in the white matter suggesting chronic changes. No evidence for acute hemorrhage, mass lesion, midline shift, hydrocephalus or large infarct. Suspect old lacune infarcts in left basal ganglia. Vascular: No hyperdense vessel or unexpected calcification. Skull: Normal. Negative for fracture or focal lesion. Sinuses/Orbits: No acute finding. Nasal septum is deviated towards the left. Other: None IMPRESSION: No acute intracranial abnormality. Atrophy and evidence for chronic small vessel ischemic disease. Electronically Signed   By: Richarda Overlie M.D.   On: 12/09/2017 12:41   Pelvis Portable  Result Date: 12/10/2017 CLINICAL DATA:  Postop right hip fracture EXAM: PORTABLE PELVIS 1-2 VIEWS COMPARISON:  12/09/2017 FINDINGS: Pubic symphysis and rami are intact. Interval intramedullary rod and screw fixation of the proximal right femur for comminuted intertrochanteric fracture. Displaced lesser trochanteric fracture fragment. No femoral head dislocation. Gas in the soft tissues consistent with recent operative status IMPRESSION: Interval surgical fixation of right intertrochanteric fracture with expected surgical changes. Electronically Signed   By: Jasmine Pang M.D.   On: 12/10/2017 02:46   Dg Knee Right Port  Result Date: 12/09/2017 CLINICAL DATA:  Hip fracture EXAM: PORTABLE RIGHT KNEE - 1-2 VIEW COMPARISON:  12/09/2017 FINDINGS: No acute displaced fracture or malalignment. Medial femoral hemi arthroplasty with intact hardware. Surface irregularity and sclerosis of the proximal, medial tibia. Moderate degenerative change of the patellofemoral and lateral joint space compartments  with joint space calcification. Trace knee effusion. Vascular calcification. IMPRESSION: 1. No acute osseous abnormality. 2. Postsurgical changes medial joint with articular surface irregularity and sclerosis of the medial proximal tibia. 3. Chondrocalcinosis.  Moderate arthritis of the  knee. Electronically Signed   By: Jasmine Pang M.D.   On: 12/09/2017 19:29   Dg C-arm 1-60 Min  Result Date: 12/10/2017 CLINICAL DATA:  Right femur fracture EXAM: RIGHT FEMUR 2 VIEWS; DG C-ARM 61-120 MIN COMPARISON:  12/09/2016 FINDINGS: Three low resolution intraoperative spot views of the right femur. Intraoperative fluoroscopy time was 1 minutes 22 seconds. The images demonstrate intramedullary rod and screw fixation of the proximal right femur for intertrochanteric fracture. Near anatomic alignment. IMPRESSION: Intraoperative fluoroscopic assistance provided during surgical fixation of right intertrochanteric fracture Electronically Signed   By: Jasmine Pang M.D.   On: 12/10/2017 02:43   Dg Hip Unilat W Or Wo Pelvis 2-3 Views Right  Result Date: 12/09/2017 CLINICAL DATA:  Fall with right hip pain. EXAM: DG HIP (WITH OR WITHOUT PELVIS) 2-3V RIGHT COMPARISON:  None. FINDINGS: Exam demonstrates a displaced intertrochanteric fracture of the right hip. There is displacement of the lesser trochanter. Mild symmetric degenerative change of the hips. Degenerate change of the spine. IMPRESSION: Displaced right intertrochanteric fracture. Electronically Signed   By: Elberta Fortis M.D.   On: 12/09/2017 12:58   Dg Femur, Min 2 Views Right  Result Date: 12/10/2017 CLINICAL DATA:  Right femur fracture EXAM: RIGHT FEMUR 2 VIEWS; DG C-ARM 61-120 MIN COMPARISON:  12/09/2016 FINDINGS: Three low resolution intraoperative spot views of the right femur. Intraoperative fluoroscopy time was 1 minutes 22 seconds. The images demonstrate intramedullary rod and screw fixation of the proximal right femur for intertrochanteric fracture. Near anatomic alignment. IMPRESSION: Intraoperative fluoroscopic assistance provided during surgical fixation of right intertrochanteric fracture Electronically Signed   By: Jasmine Pang M.D.   On: 12/10/2017 02:43     Huey Bienenstock M.D on 12/10/2017 at 1:49 PM  Between 7am to 7pm -  Pager - [phone]  After 7pm go to [email or web address] - password [facility]  [facility] -  Office  [phone]

## 2017-12-10 NOTE — Evaluation (Addendum)
Occupational Therapy Evaluation Patient Details Name: Shawn Stephenson MRN: [id number] DOB: 1929/09/30 Today's Date: 12/10/2017    History of Present Illness  84 y.o. Male presenting with femur fx after ground level fall. S/p IM nail placement and is WBAT. PMH including DM, HTN, diastolic dysfunction, asymptomatic PVCs, and right TKA (2002).   Clinical Impression   PTA, pt was living with his wife and was independent with ADLs, IADLs, and driving. Pt currently requiring Min A for LB ADLs, Min A for toileting, and Min Guard-Min A for functional mobility with RW. Pt highly motivated; feel he will progress well with time. Pt will require further acute OT to address safe tub transfers and LB ADLs. Recommend dc home with HHOT to optimize safety, independence with ADLs, reduce fall risk, and facilitate return to PLOF.     Follow Up Recommendations  Home health OT;Supervision/Assistance - 24 hour    Equipment Recommendations  3 in 1 bedside commode    Recommendations for Other Services PT consult     Precautions / Restrictions Precautions Precautions: Fall Restrictions Weight Bearing Restrictions: Yes RLE Weight Bearing: Weight bearing as tolerated      Mobility Bed Mobility Overal bed mobility: Needs Assistance Bed Mobility: Supine to Sit     Supine to sit: Min assist;+2 for safety/equipment;HOB elevated     General bed mobility comments: Min A for managing RLE and bringing hips towards EOB with use of bed pad  Transfers Overall transfer level: Needs assistance Equipment used: Rolling walker (2 wheeled) Transfers: Sit to/from Stand Sit to Stand: Min assist;+2 physical assistance         General transfer comment: Min A +2 to power up into standing from EOB and then gain balance with RW. Pt requiring Min A to power up from [facility] with VCs for hand placement.    Balance Overall balance assessment: Needs assistance Sitting-balance support: No upper extremity supported;Feet  supported Sitting balance-Leahy Scale: Fair     Standing balance support: Bilateral upper extremity supported;During functional activity Standing balance-Leahy Scale: Poor Standing balance comment: Reliant on UE support                           ADL either performed or assessed with clinical judgement   ADL Overall ADL's : Needs assistance/impaired Eating/Feeding: Set up;Sitting   Grooming: Min guard;Standing   Upper Body Bathing: Sitting;Set up;Supervision/ safety   Lower Body Bathing: Minimal assistance;Sit to/from stand   Upper Body Dressing : Set up;Supervision/safety;Sitting   Lower Body Dressing: Minimal assistance;Sit to/from stand   Toilet Transfer: Minimal assistance;BSC;Ambulation;RW(BSC over toilet)   Toileting- Clothing Manipulation and Hygiene: Minimal assistance;Sit to/from stand Toileting - Clothing Manipulation Details (indicate cue type and reason): MIn A for standing balance     Functional mobility during ADLs: Minimal assistance;Rolling walker General ADL Comments: Pt demonstrating decreased strength and balance but feel he will progress well with time. Min A for standing balance     Vision Baseline Vision/History: Wears glasses Wears Glasses: ("Sometimes") Patient Visual Report: No change from baseline Vision Assessment?: No apparent visual deficits     Perception     Praxis      Pertinent Vitals/Pain Pain Assessment: 0-10 Pain Score: 4  Pain Location: RLE Pain Descriptors / Indicators: Constant;Discomfort;Grimacing Pain Intervention(s): Monitored during session;Limited activity within patient's tolerance;Repositioned     Hand Dominance     Extremity/Trunk Assessment Upper Extremity Assessment Upper Extremity Assessment: Overall WFL for tasks assessed  Lower Extremity Assessment Lower Extremity Assessment: Defer to PT evaluation   Cervical / Trunk Assessment Cervical / Trunk Assessment: Normal   Communication  Communication Communication: No difficulties   Cognition Arousal/Alertness: Awake/alert Behavior During Therapy: WFL for tasks assessed/performed Overall Cognitive Status: Within Functional Limits for tasks assessed                                     General Comments  Pt reporting dizziness. Pt BP sitting in recliner after activity 98/58. Elevating BLEs and BO elevating to 105/89; RN notified.     Exercises     Shoulder Instructions      Home Living Family/patient expects to be discharged to:: Private residence Living Arrangements: Spouse/significant other Available Help at Discharge: Family;Available 24 hours/day Type of Home: House Home Access: Stairs to enter Enter[employer] CorporationEntrance Stairs-Number of Steps: 3 Entrance Stairs-Rails: None Home Layout: Two level Alternate Level Stairs-Number of Steps: 14 Alternate Level Stairs-Rails: Right Bathroom Shower/Tub: IT trainerTub/shower unit;Curtain   Bathroom Toilet: Standard     Home Equipment: None(Has DME in the attic and not sure what he has)          Prior Functioning/Environment Level of Independence: Independent        Comments: ADLs, IADLs, driving        OT Problem List: Decreased strength;Decreased range of motion;Decreased activity tolerance;Impaired balance (sitting and/or standing);Decreased knowledge of use of DME or AE;Decreased knowledge of precautions;Pain      OT Treatment/Interventions: Self-care/ADL training;Therapeutic exercise;Energy conservation;DME and/or AE instruction;Therapeutic activities;Patient/family education    OT Goals(Current goals can be found in the care plan section) Acute Rehab OT Goals Patient Stated Goal: Go home and return to PLOF OT Goal Formulation: With patient Time For Goal Achievement: 12/24/17 Potential to Achieve Goals: Good  OT Frequency: Min 3X/week   Barriers to D/C:            Co-evaluation PT/OT/SLP Co-Evaluation/Treatment: Yes Reason for Co-Treatment: To address  functional/ADL transfers   OT goals addressed during session: ADL's and self-care      AM-PAC PT "6 Clicks" Daily Activity     Outcome Measure Help from another person eating meals?: None Help from another person taking care of personal grooming?: A Little Help from another person toileting, which includes using toliet, bedpan, or urinal?: A Little Help from another person bathing (including washing, rinsing, drying)?: A Little Help from another person to put on and taking off regular upper body clothing?: A Little Help from another person to put on and taking off regular lower body clothing?: A Little 6 Click Score: 19   End of Session Equipment Utilized During Treatment: Gait belt;Rolling walker Nurse Communication: Mobility status;Weight bearing status  Activity Tolerance: Patient tolerated treatment well Patient left: in chair;with call bell/phone within reach  OT Visit Diagnosis: Unsteadiness on feet (R26.81);Other abnormalities of gait and mobility (R26.89);Muscle weakness (generalized) (M62.81);History of falling (Z91.81);Pain                Time: [id number] OT Time Calculation (min): 43 min Charges:  OT General Charges $OT Visit: 1 Visit OT Evaluation $OT Eval Moderate Complexity: 1 Mod G-Codes:     Casi Westerfeld MSOT, OTR/L Acute Rehab Pager: [phone] Office: [phone]  Theodoro GristCharis M Tniya Bowditch 12/10/2017, 9:45 AM

## 2017-12-10 NOTE — Evaluation (Signed)
Physical Therapy Evaluation Patient Details Name: Shawn ForehandKenneth G Tibbits MRN: [id number] DOB: February 05, 1930 Today's Date: 12/10/2017   History of Present Illness   84 y.o. Male presenting with femur fx after ground level fall. S/p IM nail placement and is WBAT. PMH including DM, HTN, diastolic dysfunction, asymptomatic PVCs, and right TKA (2002).  Clinical Impression  Pt admitted with above diagnosis. Pt currently with functional limitations due to the deficits listed below (see PT Problem List). Previously, patient was independent with ADL's and a community ambulator with no device. Currently, patient presents with decreased functional mobility secondary to RLE weakness, decreased range of motion, and pain. Ambulating 15 feet in room slowly with up to min assist. Suspect patient will progress well based on PLOF and current status. Will need continued gait and stair training prior to home. Patient states it is possible to put a bed on the main level and has a half bath, so may need to explore that option more if he is unable to climb 14 steps. Pt will benefit from skilled PT to increase their independence and safety with mobility to allow discharge to the venue listed below.       Follow Up Recommendations Home health PT;Supervision for mobility/OOB    Equipment Recommendations  Rolling walker with 5" wheels;3in1 (PT)    Recommendations for Other Services       Precautions / Restrictions Precautions Precautions: Fall Restrictions Weight Bearing Restrictions: Yes RLE Weight Bearing: Weight bearing as tolerated      Mobility  Bed Mobility Overal bed mobility: Needs Assistance Bed Mobility: Supine to Sit     Supine to sit: Min assist;HOB elevated     General bed mobility comments: Min A for managing RLE and bringing hips towards EOB with use of bed pad  Transfers Overall transfer level: Needs assistance Equipment used: Rolling walker (2 wheeled) Transfers: Sit to/from Stand Sit to  Stand: Min assist;+2 physical assistance         General transfer comment: Min A +2 to power up into standing from EOB and then gain balance with RW. Pt [facility] for hand placement.  Ambulation/Gait Ambulation/Gait assistance: Min assist Gait Distance (Feet): 15 Feet Assistive device: Rolling walker (2 wheeled) Gait Pattern/deviations: Step-to pattern;Trunk flexed;Decreased weight shift to right Gait velocity: decreased Gait velocity interpretation: <1.31 ft/sec, indicative of household ambulator General Gait Details: patient with increased bilateral knee flexion during stance phase (unsure if this is baseline). Cueing for sequencing pattern and min assist provided for negotiation of walker.  Stairs            Wheelchair Mobility    Modified Rankin (Stroke Patients Only)       Balance Overall balance assessment: Needs assistance Sitting-balance support: No upper extremity supported;Feet supported Sitting balance-Leahy Scale: Good     Standing balance support: No upper extremity supported;During functional activity Standing balance-Leahy Scale: Fair Standing balance comment: able to participate in hygiene task at sink with no UE support with supervision                             Pertinent Vitals/Pain Pain Assessment: 0-10 Pain Score: 4  Pain Location: RLE Pain Descriptors / Indicators: Constant;Discomfort;Grimacing Pain Intervention(s): Monitored during session    Home Living Family/patient expects to be discharged to:: Private residence Living Arrangements: Spouse/significant other Available Help at Discharge: Family;Available 24 hours/day Type of Home: House Home Access: Stairs to  enter Entrance Stairs-Rails: None Entrance Stairs-Number of Steps: 3 Home Layout: Two level Home Equipment: None(Has DME in the attic and not sure what he has)      Prior Function Level of Independence: Independent          Comments: ADLs, IADLs, driving     Hand Dominance        Extremity/Trunk Assessment   Upper Extremity Assessment Upper Extremity Assessment: Defer to OT evaluation    Lower Extremity Assessment Lower Extremity Assessment: RLE deficits/detail RLE Deficits / Details: previous knee surgery in 2002 and s/p IM nail. Resting in ~5 degrees knee flexion but able to actively achieve neutral. able to perform heel slide and limited hip abduction/adduction but unable to perform SLR currently    Cervical / Trunk Assessment Cervical / Trunk Assessment: Kyphotic  Communication   Communication: No difficulties  Cognition Arousal/Alertness: Awake/alert Behavior During Therapy: WFL for tasks assessed/performed Overall Cognitive Status: Within Functional Limits for tasks assessed                                        General Comments General comments (skin integrity, edema, etc.): Pt reporting dizziness. Pt BP sitting in recliner after activity 98/58. Elevating BLEs and BO elevating to 105/89; RN notified.     Exercises     Assessment/Plan    PT Assessment Patient needs continued PT services  PT Problem List Decreased strength;Decreased range of motion;Decreased activity tolerance;Decreased balance;Decreased mobility;Decreased knowledge of use of DME;Pain       PT Treatment Interventions DME instruction;Gait training;Stair training;Functional mobility training;Therapeutic activities;Therapeutic exercise;Balance training;Patient/family education    PT Goals (Current goals can be found in the Care Plan section)  Acute Rehab PT Goals Patient Stated Goal: Go home and return to PLOF PT Goal Formulation: With patient Time For Goal Achievement: 12/24/17 Potential to Achieve Goals: Good    Frequency Min 5X/week   Barriers to discharge        Co-evaluation PT/OT/SLP Co-Evaluation/Treatment: Yes Reason for Co-Treatment: To address functional/ADL transfers PT goals  addressed during session: Mobility/safety with mobility;Proper use of DME OT goals addressed during session: ADL's and self-care       AM-PAC PT "6 Clicks" Daily Activity  Outcome Measure Difficulty turning over in bed (including adjusting bedclothes, sheets and blankets)?: A Lot Difficulty moving from lying on back to sitting on the side of the bed? : Unable Difficulty sitting down on and standing up from a chair with arms (e.g., wheelchair, bedside commode, etc,.)?: Unable Help needed moving to and from a bed to chair (including a wheelchair)?: A Little Help needed walking in hospital room?: A Little Help needed climbing 3-5 steps with a railing? : A Little 6 Click Score: 13    End of Session Equipment Utilized During Treatment: Gait belt Activity Tolerance: Patient tolerated treatment well Patient left: in chair;with call bell/phone within reach Nurse Communication: Mobility status PT Visit Diagnosis: Unsteadiness on feet (R26.81);Difficulty in walking, not elsewhere classified (R26.2);Pain Pain - Right/Left: Right Pain - part of body: Leg    Time: 1610-9604 PT Time Calculation (min) (ACUTE ONLY): 43 min   Charges:   PT Evaluation $PT Eval Low Complexity: 1 Low PT Treatments $Gait Training: 8-22 mins   PT G Codes:        Laurina Bustle, PT, DPT Acute Rehabilitation Services  Pager: [phone]   Vanetta Mulders 12/10/2017, 9:56 AM

## 2017-12-10 NOTE — Anesthesia Postprocedure Evaluation (Signed)
Anesthesia Post Note  Patient: Shawn ForehandKenneth G Stephenson  Procedure(s) Performed: INTRAMEDULLARY (IM) NAIL INTERTROCHANTRIC (Right Hip)     Patient location during evaluation: PACU Anesthesia Type: Spinal Level of consciousness: oriented and awake and alert Pain management: pain level controlled Vital Signs Assessment: post-procedure vital signs reviewed and stable Respiratory status: spontaneous breathing, respiratory function stable and patient connected to nasal cannula oxygen Cardiovascular status: blood pressure returned to baseline and stable Postop Assessment: no headache, no backache and no apparent nausea or vomiting Anesthetic complications: no    Last Vitals:  Vitals:   12/09/17 2033 12/09/17 2355  BP: (!) 154/64   Pulse: 62 (!) (P) 53  Resp: 18 (!) (P) 9  Temp: 37.1 C (P) 36.8 C  SpO2: 93% (P) 98%    Last Pain:  Vitals:   12/09/17 2033  TempSrc: Oral  PainSc:                  [name] DAVID

## 2017-12-11 ENCOUNTER — Encounter (HOSPITAL_COMMUNITY): Payer: Self-pay | Admitting: Orthopedic Surgery

## 2017-12-11 DIAGNOSIS — E876 Hypokalemia: Secondary | ICD-10-CM

## 2017-12-11 LAB — GLUCOSE, CAPILLARY
GLUCOSE-CAPILLARY: 235 mg/dL — AB (ref 65–99)
GLUCOSE-CAPILLARY: 170 mg/dL — AB (ref 65–99)
GLUCOSE-CAPILLARY: 140 mg/dL — AB (ref 65–99)
Glucose-Capillary: 146 mg/dL — ABNORMAL HIGH (ref 65–99)
Glucose-Capillary: 240 mg/dL — ABNORMAL HIGH (ref 65–99)

## 2017-12-11 LAB — CBC
HEMATOCRIT: 31.3 % — AB (ref 39.0–52.0)
HEMOGLOBIN: 10.5 g/dL — AB (ref 13.0–17.0)
MCH: 31.6 pg (ref 26.0–34.0)
MCHC: 33.5 g/dL (ref 30.0–36.0)
MCV: 94.3 fL (ref 78.0–100.0)
Platelets: 132 10*3/uL — ABNORMAL LOW (ref 150–400)
RBC: 3.32 MIL/uL — ABNORMAL LOW (ref 4.22–5.81)
RDW: 12.5 % (ref 11.5–15.5)
WBC: 10.9 10*3/uL — AB (ref 4.0–10.5)

## 2017-12-11 LAB — BASIC METABOLIC PANEL
Anion gap: 5 (ref 5–15)
BUN: 17 mg/dL (ref 6–20)
CHLORIDE: 104 mmol/L (ref 101–111)
CO2: 28 mmol/L (ref 22–32)
Calcium: 8.2 mg/dL — ABNORMAL LOW (ref 8.9–10.3)
Creatinine, Ser: 0.88 mg/dL (ref 0.61–1.24)
GFR calc non Af Amer: >60 mL/min (ref >60)
Glucose, Bld: 148 mg/dL — ABNORMAL HIGH (ref 65–99)
POTASSIUM: 3.4 mmol/L — AB (ref 3.5–5.1)
Sodium: 137 mmol/L (ref 135–145)

## 2017-12-11 MED ORDER — ASPIRIN 81 MG PO CHEW: 81.0000 mg | CHEWABLE_TABLET | Freq: Every day | ORAL | 1 refills | Status: DC

## 2017-12-11 MED ORDER — POLYETHYLENE GLYCOL 3350 17 G PO PACK
17.0000 g | PACK | ORAL | Status: AC
  Administered 2017-12-11 (×2): 17 g via ORAL
  Filled 2017-12-11 (×2): qty 1

## 2017-12-11 MED ORDER — POTASSIUM CHLORIDE CRYS ER 20 MEQ PO TBCR
40.0000 meq | EXTENDED_RELEASE_TABLET | Freq: Once | ORAL | Status: AC
  Administered 2017-12-11: 40 meq via ORAL
  Filled 2017-12-11: qty 2

## 2017-12-11 MED ORDER — BISACODYL 10 MG RE SUPP: 10.0000 mg | Freq: Every day | RECTAL | Status: DC | PRN

## 2017-12-11 MED ORDER — HYDROCODONE-ACETAMINOPHEN 5-325 MG PO TABS: 1.0000 | ORAL_TABLET | Freq: Four times a day (QID) | ORAL | 0 refills | Status: DC | PRN

## 2017-12-11 MED ORDER — GLIPIZIDE ER 5 MG PO TB24
5.0000 mg | ORAL_TABLET | Freq: Every day | ORAL | Status: DC
  Administered 2017-12-11 – 2017-12-12 (×2): 5 mg via ORAL
  Filled 2017-12-11 (×2): qty 1

## 2017-12-11 NOTE — Progress Notes (Signed)
    Subjective:  Patient reports pain as mild to moderate.  Denies N/V/CP/SOB. No c/o.  Objective:   VITALS:   Vitals:   12/10/17 0138 12/10/17 0526 12/10/17 1434 12/10/17 1955  BP: 139/68 126/63 121/61 140/65  Pulse: 65 69 62 72  Resp: 16 15 17 16   Temp: 97.6 F (36.4 C) 97.9 F (36.6 C) 98 F (36.7 C) 99.6 F (37.6 C)  TempSrc: Oral Oral Oral Oral  SpO2: 90% 91% 92% 91%  Weight: 74.8 kg (165 lb)     Height: 5\' 11"  (1.803 m)       NAD ABD soft Sensation intact distally Intact pulses distally Dorsiflexion/Plantar flexion intact Incision: dressing C/D/I Compartment soft   Lab Results  Component Value Date   WBC 10.9 (H) 12/11/2017   HGB 10.5 (L) 12/11/2017   HCT 31.3 (L) 12/11/2017   MCV 94.3 12/11/2017   PLT 132 (L) 12/11/2017   BMET    Component Value Date/Time   NA 137 12/11/2017 0348   K 3.4 (L) 12/11/2017 0348   CL 104 12/11/2017 0348   CO2 28 12/11/2017 0348   GLUCOSE 148 (H) 12/11/2017 0348   BUN 17 12/11/2017 0348   CREATININE 0.88 12/11/2017 0348   CALCIUM 8.2 (L) 12/11/2017 0348   GFRNONAA >60 12/11/2017 0348   GFRAA >60 12/11/2017 0348     Assessment/Plan: 2 Days Post-Op   Active Problems:   Benign hypertensive heart disease without heart failure   Diabetes mellitus (HCC)   Diastolic dysfunction, left ventricle   Mild aortic insufficiency   Asymmetric septal hypertrophy (HCC)   Closed comminuted intertrochanteric fracture of proximal femur, right, initial encounter (HCC)   Diabetes mellitus with hyperglycemia (HCC)   Closed right femoral fracture (HCC)   Closed displaced intertrochanteric fracture of right femur (HCC)   WBAT with walker DVT ppx: Lovenox in house - home on ASA, SCDs, TEDS PO pain control PT/OT Dispo: D/C home with HHPT when ok with hospitalist   Shawn OvenBrian J Doreene Stephenson 12/11/2017, 9:31 AM   Shawn FredericBrian Margareta Laureano, MD Cell [phone]

## 2017-12-11 NOTE — Progress Notes (Signed)
Nutrition Consult/Brief Note  RD consulted via Hip Fracture Protocol.   Wt Readings from Last 15 Encounters:  12/10/17 165 lb (74.8 kg)  09/19/17 166 lb 6.4 oz (75.5 kg)  08/12/16 167 lb (75.8 kg)  02/08/16 167 lb 12.8 oz (76.1 kg)  07/17/15 170 lb 1.9 oz (77.2 kg)  11/18/14 164 lb 6.4 oz (74.6 kg)  05/19/14 166 lb (75.3 kg)  01/14/14 170 lb 12.8 oz (77.5 kg)  09/30/13 163 lb (73.9 kg)  09/24/13 165 lb (74.8 kg)  08/23/13 168 lb (76.2 kg)  01/15/13 165 lb 12.8 oz (75.2 kg)  06/27/12 163 lb (73.9 kg)  12/02/11 163 lb (73.9 kg)  06/01/11 164 lb (74.4 kg)   Body mass index is 23.01 kg/m. Patient meets criteria for Normal based on current BMI.   Pt reports a good appetite. Enjoyed his lunch today. Current diet order is Carbohydrate Modified, pt is consuming approximately 100% of meals at this time.   Pt denies recent unintentional weight loss. Labs and medications reviewed. CBG's [id number].  No nutrition interventions warranted at this time. If nutrition issues arise, please consult RD.   Maureen ChattersKatie Takeshia Wenk, RD, LDN Pager #: [phone] After-Hours Pager #: [phone]

## 2017-12-11 NOTE — Progress Notes (Signed)
Occupational Therapy Treatment Patient Details Name: Shawn ForehandKenneth G Stephenson MRN: [id number] DOB: 06/19/1930 Today's Date: 12/11/2017    History of present illness  84 y.o. Male presenting with femur fx after ground level fall. S/p IM nail placement and is WBAT. PMH including DM, HTN, diastolic dysfunction, asymptomatic PVCs, and right TKA (2002).   OT comments  Pt making good progress with functional goals. Pt participated in toileting in bathroom and bathing/dressing at sink seated and standing. Pt has tub bench at home and reports that he knows how to use it. OT will continue to follow acutely  Follow Up Recommendations  Home health OT;Supervision/Assistance - 24 hour    Equipment Recommendations  3 in 1 bedside commode    Recommendations for Other Services      Precautions / Restrictions Precautions Precautions: Fall Restrictions Weight Bearing Restrictions: Yes RLE Weight Bearing: Weight bearing as tolerated       Mobility Bed Mobility               General bed mobility comments: pt in bathroom with NT upon arrival  Transfers Overall transfer level: Needs assistance Equipment used: Rolling walker (2 wheeled) Transfers: Sit to/from Stand Sit to Stand: Min assist;Min guard         General transfer comment: cues for correct hand placement    Balance Overall balance assessment: Needs assistance Sitting-balance support: No upper extremity supported;Feet supported Sitting balance-Leahy Scale: Good Sitting balance - Comments: pt sat at sink for most of bathing   Standing balance support: No upper extremity supported;During functional activity Standing balance-Leahy Scale: Fair Standing balance comment: pt stood for LB bathing at sink                           ADL either performed or assessed with clinical judgement   ADL Overall ADL's : Needs assistance/impaired     Grooming: Min guard;Standing       Lower Body Bathing: Minimal assistance;Sit  to/from stand;Min guard       Lower Body Dressing: Minimal assistance;Sit to/from stand;Min guard   Toilet Transfer: Ambulation;RW;Min guard;Comfort height toilet   Toileting- Clothing Manipulation and Hygiene: Sit to/from stand;Min guard       Functional mobility during ADLs: Minimal assistance;Rolling walker;Min guard General ADL Comments: Pt participated in toileting in bathroom and bathing/dressing at sink seated and standing. Pt has tub bench at home and reports that he knows how to use it     Vision Baseline Vision/History: Wears glasses Patient Visual Report: No change from baseline     Perception     Praxis      Cognition Arousal/Alertness: Awake/alert Behavior During Therapy: WFL for tasks assessed/performed Overall Cognitive Status: Within Functional Limits for tasks assessed                                          Exercises     Shoulder Instructions       General Comments      Pertinent Vitals/ Pain       Pain Assessment: 0-10 Pain Score: 3  Pain Location: RLE Pain Descriptors / Indicators: Discomfort;Sore Pain Intervention(s): Monitored during session;Premedicated before session;Repositioned  Home Living  Prior Functioning/Environment              Frequency  Min 2X/week        Progress Toward Goals  OT Goals(current goals can now be found in the care plan section)  Progress towards OT goals: Progressing toward goals  ADL Goals Pt Will Perform Lower Body Bathing: with min guard assist;with supervision;with set-up;sit to/from stand Pt Will Perform Lower Body Dressing: with supervision;with set-up;sit to/from stand Pt Will Transfer to Toilet: with min guard assist;with supervision;ambulating Pt Will Perform Toileting - Clothing Manipulation and hygiene: with supervision;sit to/from stand Pt Will Perform Tub/Shower Transfer: with min guard assist;with  supervision;ambulating;rolling walker;tub bench  Plan Discharge plan remains appropriate    Co-evaluation                 AM-PAC PT "6 Clicks" Daily Activity     Outcome Measure   Help from another person eating meals?: None Help from another person taking care of personal grooming?: A Little Help from another person toileting, which includes using toliet, bedpan, or urinal?: A Little Help from another person bathing (including washing, rinsing, drying)?: A Little Help from another person to put on and taking off regular upper body clothing?: A Little Help from another person to put on and taking off regular lower body clothing?: A Little 6 Click Score: 19    End of Session Equipment Utilized During Treatment: Gait belt;Rolling walker  OT Visit Diagnosis: Unsteadiness on feet (R26.81);Other abnormalities of gait and mobility (R26.89);Muscle weakness (generalized) (M62.81);History of falling (Z91.81);Pain Pain - Right/Left: Right Pain - part of body: Hip   Activity Tolerance Patient tolerated treatment well   Patient Left in chair;with call bell/phone within reach   Nurse Communication      Functional Assessment Tool Used: AM-PAC 6 Clicks Daily Activity   Time: 7829-5621 OT Time Calculation (min): 51 min  Charges: OT G-codes **NOT FOR INPATIENT CLASS** Functional Assessment Tool Used: AM-PAC 6 Clicks Daily Activity OT General Charges $OT Visit: 1 Visit OT Treatments $Self Care/Home Management : 23-37 mins $Therapeutic Activity: 8-22 mins     Galen Manila 12/11/2017, 1:28 PM

## 2017-12-11 NOTE — Progress Notes (Addendum)
PROGRESS NOTE                                                                                                                                                                                                             Patient Demographics:    Shawn Stephenson, is a 84 y.o. male, DOB - 12/24/1929, VWU:[id number]  Admit date - 12/09/2017   Admitting Physician No admitting provider for patient encounter.  Outpatient Primary MD for the patient is Carylon PerchesFagan, Roy, MD  LOS - 2   Chief Complaint  Patient presents with  . Fall       Brief Narrative   84 y.o. male, history of hypertensive heart disease and history of diastolic dysfunction, hyperlipidemia and diabetes mellitus on oral hypoglycemic who follows with [facility] cardiology every 6-12 months, presents with mechanical fall, sustained right hip fracture,, status post surgical repair by Dr. Linna CapriceSwinteck   Subjective:    Shawn SealsKenneth Stephenson today has, No headache, No chest pain, No abdominal pain -reports right hip pain is controlled  Assessment  & Plan :    Active Problems:   Benign hypertensive heart disease without heart failure   Diabetes mellitus (HCC)   Diastolic dysfunction, left ventricle   Mild aortic insufficiency   Asymmetric septal hypertrophy (HCC)   Closed comminuted intertrochanteric fracture of proximal femur, right, initial encounter (HCC)   Diabetes mellitus with hyperglycemia (HCC)   Closed right femoral fracture (HCC)   Closed displaced intertrochanteric fracture of right femur (HCC)   Closed comminuted intertrochanteric fracture of proximal femur, right -Discussed with the patient, it does appear to be secondary to mechanical fall he denies any loss of consciousness or lightheadedness, . -orthopedic input greatly appreciated, status post surgical repair,  -seen By PT, recommendation for home with home health    Chronic diastolic CHF  -Appears to be euvolemic  Hypertension -   Continue beta-blocker and HCTZ , blood pressure is acceptable  Acute blood loss anemia -Postoperative, expected, will start on iron supplements on discharge.  Pete CBC in a.m.  Hypokalemia -Repleted, recheck in a.m.  Diabetes mellitus type II, controlled with hyperglycemia (HCC) -BG started to increase, resume home dose glipizide, and continue with insulin sliding scale .  Asymmetric septal hypertrophy/mild aortic insufficiency  - [place] every 6 months (last seen in April 2019)  Hyperlipidemia -  continue with home dose statin         Code Status : Full  Family Communication  : None at bedside  Disposition Plan  : home with [facility] tomorrow  Consults  :  ortho  Procedures  : (LRB): INTRAMEDULLARY (IM) NAIL INTERTROCHANTRIC (Right    DVT Prophylaxis  :  Lovenox  Lab Results  Component Value Date   PLT 132 (L) 12/11/2017    Antibiotics  :    Anti-infectives (From admission, onward)   Start     Dose/Rate Route Frequency Ordered Stop   12/10/17 0600  ceFAZolin (ANCEF) IVPB 2g/100 mL premix     2 g 200 mL/hr over 30 Minutes Intravenous To Short Stay 12/09/17 1946 12/09/17 2253   12/10/17 0400  ceFAZolin (ANCEF) IVPB 2g/100 mL premix     2 g 200 mL/hr over 30 Minutes Intravenous Every 6 hours 12/10/17 0123 12/10/17 1337   12/09/17 2030  ceFAZolin (ANCEF) IVPB 2g/100 mL premix  Status:  Discontinued     2 g 200 mL/hr over 30 Minutes Intravenous To Short Stay 12/09/17 1838 12/09/17 1946        Objective:   Vitals:   12/10/17 0526 12/10/17 1434 12/10/17 1955 12/11/17 1213  BP: 126/63 121/61 140/65 (!) 138/55  Pulse: 69 62 72 67  Resp: 15 17 16 16   Temp: 97.9 F (36.6 C) 98 F (36.7 C) 99.6 F (37.6 C) 98.2 F (36.8 C)  TempSrc: Oral Oral Oral Oral  SpO2: 91% 92% 91% 95%  Weight:      Height:        Wt Readings from Last 3 Encounters:  12/10/17 74.8 kg (165 lb)  09/19/17 75.5 kg (166 lb 6.4 oz)  08/12/16 75.8 kg (167 lb)      Intake/Output Summary (Last 24 hours) at 12/11/2017 1237 Last data filed at 12/11/2017 0700 Gross per 24 hour  Intake 1018.71 ml  Output 450 ml  Net 568.71 ml     Physical Exam  Awake Alert, Oriented X 3, No new F.N deficits, Normal affect Symmetrical Chest wall movement, Good air movement bilaterally, CTAB RRR,No Gallops,Rubs or new Murmurs, No Parasternal Heave +ve B.Sounds, Abd Soft, No tenderness, No organomegaly appriciated, No rebound - guarding or rigidity. No Cyanosis, Clubbing or edema, No new Rash or bruise  , right hip bandage looks dry, no bleeding or oozing    Data Review:    CBC Recent Labs  Lab 12/09/17 1136 12/09/17 1852 12/11/17 0348  WBC 5.7 10.6* 10.9*  HGB 14.6 13.4 10.5*  HCT 43.2 39.2 31.3*  PLT 154 155 132*  MCV 92.7 92.0 94.3  MCH 31.3 31.5 31.6  MCHC 33.8 34.2 33.5  RDW 12.7 12.3 12.5  LYMPHSABS 1.1  --   --   MONOABS 0.3  --   --   EOSABS 0.1  --   --   BASOSABS 0.0  --   --     Chemistries  Recent Labs  Lab 12/09/17 1136 12/09/17 1852 12/11/17 0348  NA 137  --  137  K 3.5  --  3.4*  CL 100*  --  104  CO2 29  --  28  GLUCOSE 257*  --  148*  BUN 25*  --  17  CREATININE 0.80 0.81 0.88  CALCIUM 9.0  --  8.2*  MG 2.1  --   --   AST 31  --   --   ALT 33  --   --  ALKPHOS 60  --   --   BILITOT 1.2  --   --    ------------------------------------------------------------------------------------------------------------------ No results for input(s): CHOL, HDL, LDLCALC, TRIG, CHOLHDL, LDLDIRECT in the last 72 hours.  No results found for: HGBA1C ------------------------------------------------------------------------------------------------------------------ No results for input(s): TSH, T4TOTAL, T3FREE, THYROIDAB in the last 72 hours.  Invalid input(s): FREET3 ------------------------------------------------------------------------------------------------------------------ No results for input(s): VITAMINB12, FOLATE,  FERRITIN, TIBC, IRON, RETICCTPCT in the last 72 hours.  Coagulation profile No results for input(s): INR, PROTIME in the last 168 hours.  No results for input(s): DDIMER in the last 72 hours.  Cardiac Enzymes No results for input(s): CKMB, TROPONINI, MYOGLOBIN in the last 168 hours.  Invalid input(s): CK ------------------------------------------------------------------------------------------------------------------ No results found for: BNP  Inpatient Medications  Scheduled Meds: . amLODipine  10 mg Oral Daily  . aspirin  81 mg Oral Daily  . atorvastatin  5 mg Oral Daily  . carvedilol  6.25 mg Oral BID WC  . cholecalciferol  1,000 Units Oral Daily  . docusate sodium  100 mg Oral BID  . enoxaparin (LOVENOX) injection  40 mg Subcutaneous Q24H  . hydrochlorothiazide  25 mg Oral Daily  . insulin aspart  0-9 Units Subcutaneous TID WC  . latanoprost  1 drop Right Eye QHS  . loratadine  10 mg Oral Daily  . magnesium oxide  400 mg Oral Daily  . senna  1 tablet Oral BID  . vitamin B-12  1,000 mcg Oral Daily  . pyridOXINE  25 mg Oral Daily   Continuous Infusions: . methocarbamol (ROBAXIN)  IV     PRN Meds:.HYDROcodone-acetaminophen, methocarbamol **OR** methocarbamol (ROBAXIN)  IV, metoCLOPramide **OR** metoCLOPramide (REGLAN) injection, morphine injection, ondansetron **OR** ondansetron (ZOFRAN) IV  Micro Results No results found for this or any previous visit (from the past 240 hour(s)).  Radiology Reports Dg Chest 1 View  Result Date: 12/09/2017 CLINICAL DATA:  Fall with right hip pain. EXAM: CHEST  1 VIEW COMPARISON:  None. FINDINGS: Lungs are adequately inflated without consolidation, effusion or pneumothorax. Cardiomediastinal silhouette is within normal. Mild degenerate change of the spine. IMPRESSION: No acute findings. Electronically Signed   By: Elberta Fortis M.D.   On: 12/09/2017 12:59   Ct Head Wo Contrast  Result Date: 12/09/2017 CLINICAL DATA:  84 year old fell  on concrete floor. Altered level of consciousness. EXAM: CT HEAD WITHOUT CONTRAST TECHNIQUE: Contiguous axial images were obtained from the base of the skull through the vertex without intravenous contrast. COMPARISON:  Cervical spine CT 12/05/2006 FINDINGS: Brain: Diffuse cerebral edema. Low-density in the white matter suggesting chronic changes. No evidence for acute hemorrhage, mass lesion, midline shift, hydrocephalus or large infarct. Suspect old lacune infarcts in left basal ganglia. Vascular: No hyperdense vessel or unexpected calcification. Skull: Normal. Negative for fracture or focal lesion. Sinuses/Orbits: No acute finding. Nasal septum is deviated towards the left. Other: None IMPRESSION: No acute intracranial abnormality. Atrophy and evidence for chronic small vessel ischemic disease. Electronically Signed   By: Richarda Overlie M.D.   On: 12/09/2017 12:41   Pelvis Portable  Result Date: 12/10/2017 CLINICAL DATA:  Postop right hip fracture EXAM: PORTABLE PELVIS 1-2 VIEWS COMPARISON:  12/09/2017 FINDINGS: Pubic symphysis and rami are intact. Interval intramedullary rod and screw fixation of the proximal right femur for comminuted intertrochanteric fracture. Displaced lesser trochanteric fracture fragment. No femoral head dislocation. Gas in the soft tissues consistent with recent operative status IMPRESSION: Interval surgical fixation of right intertrochanteric fracture with expected surgical changes. Electronically Signed   By: Selena Batten  Jake Samples M.D.   On: 12/10/2017 02:46   Dg Knee Right Port  Result Date: 12/09/2017 CLINICAL DATA:  Hip fracture EXAM: PORTABLE RIGHT KNEE - 1-2 VIEW COMPARISON:  12/09/2017 FINDINGS: No acute displaced fracture or malalignment. Medial femoral hemi arthroplasty with intact hardware. Surface irregularity and sclerosis of the proximal, medial tibia. Moderate degenerative change of the patellofemoral and lateral joint space compartments with joint space calcification. Trace  knee effusion. Vascular calcification. IMPRESSION: 1. No acute osseous abnormality. 2. Postsurgical changes medial joint with articular surface irregularity and sclerosis of the medial proximal tibia. 3. Chondrocalcinosis.  Moderate arthritis of the knee. Electronically Signed   By: Jasmine Pang M.D.   On: 12/09/2017 19:29   Dg C-arm 1-60 Min  Result Date: 12/10/2017 CLINICAL DATA:  Right femur fracture EXAM: RIGHT FEMUR 2 VIEWS; DG C-ARM 61-120 MIN COMPARISON:  12/09/2016 FINDINGS: Three low resolution intraoperative spot views of the right femur. Intraoperative fluoroscopy time was 1 minutes 22 seconds. The images demonstrate intramedullary rod and screw fixation of the proximal right femur for intertrochanteric fracture. Near anatomic alignment. IMPRESSION: Intraoperative fluoroscopic assistance provided during surgical fixation of right intertrochanteric fracture Electronically Signed   By: Jasmine Pang M.D.   On: 12/10/2017 02:43   Dg Hip Unilat W Or Wo Pelvis 2-3 Views Right  Result Date: 12/09/2017 CLINICAL DATA:  Fall with right hip pain. EXAM: DG HIP (WITH OR WITHOUT PELVIS) 2-3V RIGHT COMPARISON:  None. FINDINGS: Exam demonstrates a displaced intertrochanteric fracture of the right hip. There is displacement of the lesser trochanter. Mild symmetric degenerative change of the hips. Degenerate change of the spine. IMPRESSION: Displaced right intertrochanteric fracture. Electronically Signed   By: Elberta Fortis M.D.   On: 12/09/2017 12:58   Dg Femur, Min 2 Views Right  Result Date: 12/10/2017 CLINICAL DATA:  Right femur fracture EXAM: RIGHT FEMUR 2 VIEWS; DG C-ARM 61-120 MIN COMPARISON:  12/09/2016 FINDINGS: Three low resolution intraoperative spot views of the right femur. Intraoperative fluoroscopy time was 1 minutes 22 seconds. The images demonstrate intramedullary rod and screw fixation of the proximal right femur for intertrochanteric fracture. Near anatomic alignment. IMPRESSION:  Intraoperative fluoroscopic assistance provided during surgical fixation of right intertrochanteric fracture Electronically Signed   By: Jasmine Pang M.D.   On: 12/10/2017 02:43     Huey Bienenstock M.D on 12/11/2017 at 12:37 PM  Between 7am to 7pm - Pager - [phone]  After 7pm go to [email or web address] - password [email or web address]  [facility] -  Office  [phone]

## 2017-12-11 NOTE — Progress Notes (Signed)
Physical Therapy Treatment Patient Details Name: Shawn ForehandKenneth G Mincy MRN: [id number] DOB: 1929/08/04 Today's Date: 12/11/2017    History of Present Illness  84 y.o. Male presenting with R femur fx after ground level fall. S/p R femur IM nail placement and is WBAT. PMH including DM, HTN, diastolic dysfunction, asymptomatic PVCs, and right TKA (2002).    PT Comments    Pt able to walk to the PT gym min guard assist overall, and we initiated bed mobility practice out of the right side of the bed with bed flat simulating home setup.  He will need more practice with this as well as stairs with cane vs hand held assist from his wife.  Please also review seated and standing exercises prior to possible d/c tomorrow.  PT will continue to follow acutely.    Follow Up Recommendations  Home health PT;Supervision for mobility/OOB     Equipment Recommendations  Rolling walker with 5" wheels;3in1 (PT)    Recommendations for Other Services   NA     Precautions / Restrictions Precautions Precautions: Fall Restrictions Weight Bearing Restrictions: Yes RLE Weight Bearing: Weight bearing as tolerated    Mobility  Bed Mobility Overal bed mobility: Needs Assistance Bed Mobility: Supine to Sit;Sit to Supine     Supine to sit: Min assist;HOB elevated Sit to supine: Min assist(HOB flat, no rails into the right side)   General bed mobility comments: Min assist to help progress right leg to EOB HOB mildly elevated.  When going back into the bed, pt practiced going into the right side with bed flat and no rails as this is the side he will be getting into and out of at home in the bed downstairs.   Transfers Overall transfer level: Needs assistance Equipment used: Rolling walker (2 wheeled) Transfers: Sit to/from Stand Sit to Stand: Min guard         General transfer comment: Min guard assist for safety, verbal cues for safe hand placement and sequencing.  He still needed min assist despite PT  showing him the opposite leg hooking technique.    Ambulation/Gait Ambulation/Gait assistance: Min guard Gait Distance (Feet): 150 Feet Assistive device: Rolling walker (2 wheeled) Gait Pattern/deviations: Step-through pattern;Antalgic Gait velocity: decreased   General Gait Details: Pt with mildly antalgic gait pattern, verbal cues for heel to toe sequencing.    Stairs Stairs: Yes Stairs assistance: Supervision Stair Management: Two rails;Step to pattern;Forwards Number of Stairs: 5 General stair comments: we practiced the stairs with bil rails initially, just so he could get the sequencing and the feel of the weight required on his R leg.  Tomorrow we will need to practice with cane and no rails.            Balance Overall balance assessment: Needs assistance Sitting-balance support: Feet supported;No upper extremity supported Sitting balance-Leahy Scale: Good Sitting balance - Comments: pt sat at sink for most of bathing   Standing balance support: Bilateral upper extremity supported Standing balance-Leahy Scale: Fair Standing balance comment: supervision for hand washing at the sink.                             Cognition Arousal/Alertness: Awake/alert Behavior During Therapy: WFL for tasks assessed/performed Overall Cognitive Status: Within Functional Limits for tasks assessed  Exercises Total Joint Exercises Ankle Circles/Pumps: AROM;Both;20 reps Quad Sets: AROM;Both;10 reps Short Arc Quad: AROM;Right;10 reps Heel Slides: AAROM;Right;10 reps Hip ABduction/ADduction: AAROM;Right;10 reps    General Comments        Pertinent Vitals/Pain Pain Assessment: 0-10 Pain Score: 3  Pain Location: RLE Pain Descriptors / Indicators: Discomfort;Sore Pain Intervention(s): Limited activity within patient's tolerance;Monitored during session;Repositioned           PT Goals (current goals can now be  found in the care plan section) Acute Rehab PT Goals Patient Stated Goal: Go home and return to PLOF Progress towards PT goals: Progressing toward goals    Frequency    Min 5X/week      PT Plan Current plan remains appropriate       AM-PAC PT "6 Clicks" Daily Activity  Outcome Measure  Difficulty turning over in bed (including adjusting bedclothes, sheets and blankets)?: A Little Difficulty moving from lying on back to sitting on the side of the bed? : A Little Difficulty sitting down on and standing up from a chair with arms (e.g., wheelchair, bedside commode, etc,.)?: A Little Help needed moving to and from a bed to chair (including a wheelchair)?: A Little Help needed walking in hospital room?: A Little Help needed climbing 3-5 steps with a railing? : A Little 6 Click Score: 18    End of Session   Activity Tolerance: Patient limited by fatigue;Patient limited by pain Patient left: in bed;with call bell/phone within reach;with family/visitor present   PT Visit Diagnosis: Unsteadiness on feet (R26.81);Difficulty in walking, not elsewhere classified (R26.2);Pain Pain - Right/Left: Right Pain - part of body: Leg     Time: 1550-1656 PT Time Calculation (min) (ACUTE ONLY): 66 min  Charges:  $Gait Training: 23-37 mins $Therapeutic Exercise: 23-37 mins          Zinnia Tindall B. Enaya Howze, PT, DPT [phone]            12/11/2017, 5:00 PM

## 2017-12-12 LAB — CBC
HCT: 30.8 % — ABNORMAL LOW (ref 39.0–52.0)
HEMOGLOBIN: 10.2 g/dL — AB (ref 13.0–17.0)
MCH: 31.1 pg (ref 26.0–34.0)
MCHC: 33.1 g/dL (ref 30.0–36.0)
MCV: 93.9 fL (ref 78.0–100.0)
Platelets: 121 10*3/uL — ABNORMAL LOW (ref 150–400)
RBC: 3.28 MIL/uL — AB (ref 4.22–5.81)
RDW: 12.3 % (ref 11.5–15.5)
WBC: 9.3 10*3/uL (ref 4.0–10.5)

## 2017-12-12 LAB — GLUCOSE, CAPILLARY
GLUCOSE-CAPILLARY: 184 mg/dL — AB (ref 70–99)
Glucose-Capillary: 111 mg/dL — ABNORMAL HIGH (ref 70–99)
Glucose-Capillary: 146 mg/dL — ABNORMAL HIGH (ref 70–99)

## 2017-12-12 MED ORDER — ENOXAPARIN SODIUM 40 MG/0.4ML ~~LOC~~ SOLN: 40.0000 mg | SUBCUTANEOUS | 0 refills | Status: DC

## 2017-12-12 MED ORDER — FERROUS SULFATE 325 (65 FE) MG PO TBEC: 325.0000 mg | DELAYED_RELEASE_TABLET | Freq: Two times a day (BID) | ORAL | 1 refills | Status: DC

## 2017-12-12 MED ORDER — ENOXAPARIN (LOVENOX) PATIENT EDUCATION KIT
PACK | Freq: Once | Status: AC
  Administered 2017-12-12: 15:00:00
  Filled 2017-12-12: qty 1

## 2017-12-12 NOTE — Progress Notes (Signed)
    Subjective:  Patient reports pain as mild to moderate.  Denies N/V/CP/SOB. No c/o.  Objective:   VITALS:   Vitals:   12/10/17 1955 12/11/17 1213 12/11/17 2131 12/12/17 0437  BP: 140/65 (!) 138/55 (!) 147/62 138/61  Pulse: 72 67 81 72  Resp: 16 16  17   Temp: 99.6 F (37.6 C) 98.2 F (36.8 C) 98.7 F (37.1 C) 98.6 F (37 C)  TempSrc: Oral Oral Oral Oral  SpO2: 91% 95% 94% 93%  Weight:      Height:        NAD ABD soft Sensation intact distally Intact pulses distally Dorsiflexion/Plantar flexion intact Incision: dressing C/D/I Compartment soft   Lab Results  Component Value Date   WBC 9.3 12/12/2017   HGB 10.2 (L) 12/12/2017   HCT 30.8 (L) 12/12/2017   MCV 93.9 12/12/2017   PLT 121 (L) 12/12/2017   BMET    Component Value Date/Time   NA 137 12/11/2017 0348   K 3.4 (L) 12/11/2017 0348   CL 104 12/11/2017 0348   CO2 28 12/11/2017 0348   GLUCOSE 148 (H) 12/11/2017 0348   BUN 17 12/11/2017 0348   CREATININE 0.88 12/11/2017 0348   CALCIUM 8.2 (L) 12/11/2017 0348   GFRNONAA >60 12/11/2017 0348   GFRAA >60 12/11/2017 0348     Assessment/Plan: 3 Days Post-Op   Active Problems:   Benign hypertensive heart disease without heart failure   Diabetes mellitus (HCC)   Diastolic dysfunction, left ventricle   Mild aortic insufficiency   Asymmetric septal hypertrophy (HCC)   Closed comminuted intertrochanteric fracture of proximal femur, right, initial encounter (HCC)   Diabetes mellitus with hyperglycemia (HCC)   Closed right femoral fracture (HCC)   Closed displaced intertrochanteric fracture of right femur (HCC)   WBAT with walker DVT ppx: Lovenox in house - home on ASA, SCDs, TEDS PO pain control PT/OT Dispo: D/C home with HHPT when ok with hospitalist   Iline OvenBrian J Juelz Claar 12/12/2017, 7:44 AM   Samson FredericBrian Lamarco Gudiel, MD Cell [phone]

## 2017-12-12 NOTE — Care Management Important Message (Signed)
Important Message  Patient Details  Name: Shawn ForehandKenneth G Brogan MRN: [id number] Date of Birth: 1929/08/09   Medicare Important Message Given:  Yes    Cheryll Keisler Stefan ChurchBratton 12/12/2017, 3:46 PM

## 2017-12-12 NOTE — Progress Notes (Signed)
Discussed discharge information with pt and pt wife, discussed discharge medications, follow up appointments, both verbalized understanding of information. Pt ivs removed at this time.

## 2017-12-12 NOTE — Care Management Note (Signed)
Case Management Note  Patient Details  Name: Shawn ForehandKenneth G Stephenson MRN: [id number] Date of Birth: [birth year]  Subjective/Objective:   84 yr old gentleman  Admitted with a right hip fracture after a fall. Patient s/p right hip IM Nailing.                 Action/Plan: Patient was preoperatively setup with [facility], no changes. Will have support at discharge.    Expected Discharge Date:  12/12/17               Expected Discharge Plan:  Home w Home Health Services  In-House Referral:  NA  Discharge planning Services  CM Consult  Post Acute Care Choice:  Durable Medical Equipment, Home Health Choice offered to:  Patient  DME Arranged:  3-N-1, Walker rolling DME Agency:  [facility]  HH Arranged:  PT, OT HH Agency:  [facility]  Status of Service:  Completed, signed off  If discussed at [employer] Meetings, dates discussed:    Additional Comments:  Durenda GuthrieBrady, Keylon Labelle Naomi, RN 12/12/2017, 1:26 PM

## 2017-12-12 NOTE — Progress Notes (Signed)
Physical Therapy Treatment Patient Details Name: Shawn Stephenson MRN: [id number] DOB: 05-Nov-1929 Today's Date: 12/12/2017    History of Present Illness  84 y.o. Male presenting with R femur fx after ground level fall. S/p R femur IM nail placement and is WBAT. PMH including DM, HTN, diastolic dysfunction, asymptomatic PVCs, and right TKA (2002).    PT Comments    Pt mobilizing well and progressing towards goals. He tolerated increased ambulation distance today and negotiated steps. Completed reviewing HEP and answered pt and spouses questions regarding safety with mobility for return home. Current d/c plan remains appropriate.     Follow Up Recommendations  Home health PT;Supervision for mobility/OOB     Equipment Recommendations  Rolling walker with 5" wheels;3in1 (PT)    Recommendations for Other Services       Precautions / Restrictions Precautions Precautions: Fall Restrictions Weight Bearing Restrictions: Yes RLE Weight Bearing: Weight bearing as tolerated    Mobility  Bed Mobility Overal bed mobility: Needs Assistance Bed Mobility: Supine to Sit;Sit to Supine     Supine to sit: Min assist(HOB flat, no rails into the right side) Sit to supine: Min assist(HOB flat, no rails into the right side)   General bed mobility comments: min A to progress LEs off and on bed when exiting and entering from R side with HOB flat and no bed rails to simulate home enviroment.  Cues for technique  Transfers Overall transfer level: Needs assistance Equipment used: Rolling walker (2 wheeled) Transfers: Sit to/from Stand Sit to Stand: Min guard         General transfer comment: min guard for safety and cues for hand placement.  Ambulation/Gait Ambulation/Gait assistance: Min guard Gait Distance (Feet): 275 Feet Assistive device: Rolling walker (2 wheeled) Gait Pattern/deviations: Step-through pattern;Antalgic;Narrow base of support Gait velocity: decreased   General Gait  Details: pt with mildly antalgic gait pattern, overall steady with RW. Cues to look forward. Pt relaying heavily on RW to off load R LE. Several brief standing rest brakes secondary to UE fatigue.   Stairs   Stairs assistance: Min assist Stair Management: Step to pattern;No rails;Backwards;With walker   General stair comments: Pt negoticated steps with min A for RW management.Pt's wife not present for stair training, however provided with written handout for stairs.   Wheelchair Mobility    Modified Rankin (Stroke Patients Only)       Balance Overall balance assessment: Needs assistance Sitting-balance support: Feet supported;No upper extremity supported Sitting balance-Leahy Scale: Good     Standing balance support: Bilateral upper extremity supported Standing balance-Leahy Scale: Fair Standing balance comment: supervision for hand washing at the sink.                             Cognition Arousal/Alertness: Awake/alert Behavior During Therapy: WFL for tasks assessed/performed Overall Cognitive Status: Within Functional Limits for tasks assessed                                        Exercises Total Joint Exercises Hip ABduction/ADduction: AROM;Right;10 reps;Standing Long Arc Quad: AROM;Right;10 reps;Seated Knee Flexion: AROM;Right;10 reps;Standing Marching in Standing: AROM;Both;5 reps;Standing Standing Hip Extension: AROM;Right;10 reps;Standing    General Comments General comments (skin integrity, edema, etc.): wife arrived at end of session.      Pertinent Vitals/Pain Pain Assessment: Faces Faces Pain Scale: Hurts even more  Pain Location: RLE with ambulation Pain Descriptors / Indicators: Discomfort;Sore Pain Intervention(s): Monitored during session;Limited activity within patient's tolerance    Home Living                      Prior Function            PT Goals (current goals can now be found in the care plan  section) Acute Rehab PT Goals Patient Stated Goal: Go home and return to PLOF PT Goal Formulation: With patient Time For Goal Achievement: 12/24/17 Potential to Achieve Goals: Good Progress towards PT goals: Progressing toward goals    Frequency    Min 5X/week      PT Plan Current plan remains appropriate    Co-evaluation              AM-PAC PT "6 Clicks" Daily Activity  Outcome Measure  Difficulty turning over in bed (including adjusting bedclothes, sheets and blankets)?: A Little Difficulty moving from lying on back to sitting on the side of the bed? : A Little Difficulty sitting down on and standing up from a chair with arms (e.g., wheelchair, bedside commode, etc,.)?: A Little Help needed moving to and from a bed to chair (including a wheelchair)?: A Little Help needed walking in hospital room?: A Little Help needed climbing 3-5 steps with a railing? : A Little 6 Click Score: 18    End of Session Equipment Utilized During Treatment: Gait belt Activity Tolerance: Patient tolerated treatment well Patient left: in bed;with call bell/phone within reach;with family/visitor present Nurse Communication: Mobility status PT Visit Diagnosis: Unsteadiness on feet (R26.81);Difficulty in walking, not elsewhere classified (R26.2);Pain Pain - Right/Left: Right Pain - part of body: Leg     Time: [id number] PT Time Calculation (min) (ACUTE ONLY): 60 min  Charges:  $Gait Training: 23-37 mins $Therapeutic Exercise: 8-22 mins $Therapeutic Activity: 8-22 mins                    G Codes:       Kallie LocksHannah Adrie Picking, [place]PTA Pager [phone] Acute Rehab   Sheral ApleyHannah E Paola Aleshire 12/12/2017, 12:10 PM

## 2017-12-12 NOTE — Discharge Summary (Signed)
Shawn Stephenson, is a 84 y.o. male  DOB [birth year]  MRN [id number].  Admission date:  12/09/2017  Admitting Physician  No admitting provider for patient encounter.  Discharge Date:  12/12/2017   Primary MD  Asencion Noble, MD  Recommendations for primary care physician for things to follow:  -Please check CBC, BMP during next visit   Admission Diagnosis  Fall [W19.XXXA] Fall, initial encounter [id number]] Closed displaced intertrochanteric fracture of right femur [facility]) [S72.141A]   Discharge Diagnosis  Fall [W19.XXXA] Fall, initial encounter [id number]] Closed displaced intertrochanteric fracture of right femur ([place]) [S72.141A]    Active Problems:   Benign hypertensive heart disease without heart failure   Diabetes mellitus (HCC)   Diastolic dysfunction, left ventricle   Mild aortic insufficiency   Asymmetric septal hypertrophy (HCC)   Closed comminuted intertrochanteric fracture of proximal femur, right, initial encounter ([place])   Diabetes mellitus with hyperglycemia ([place])   Closed right femoral fracture (HCC)   Closed displaced intertrochanteric fracture of right femur [facility])      Past Medical History:  Diagnosis Date  . Asymptomatic PVCs   . Diabetes mellitus    well controlled  . Diastolic dysfunction   . Hyperlipidemia   . Hypertension   . Mitral regurgitation    mild    Past Surgical History:  Procedure Laterality Date  . INTRAMEDULLARY (IM) NAIL INTERTROCHANTERIC Right 12/09/2017   Procedure: INTRAMEDULLARY (IM) NAIL INTERTROCHANTRIC;  Surgeon: Rod Can, MD;  Location: [place];  Service: Orthopedics;  Laterality: Right;       History of present illness and  Hospital Course:     Kindly see H&P for history of present illness and admission details, please review complete Labs, Consult reports and Test reports for all details in brief  HPI  from the history and physical done  on the day of admission 12/09/17 Shawn Stephenson  is a 84 y.o. male, history of hypertensive heart disease and history of diastolic dysfunction, hyperlipidemia and diabetes mellitus on oral hypoglycemic who follows with [facility] cardiology every 6-12 months resented to the ED with fall at home.  Patient was moving both from the shelf in his garage into a box when he stood up from a sitting position and felt like he was going to fall in the next thing he remembers he was on the floor.  He does not think he passed out and landed on his buttocks.  He had severe pain in his right hip and was unable to get up.  Denies any recent illness, fevers, chills, headache, blurred vision, bowel or urinary incontinence, chest pain, palpitations, shortness of breath, abdominal pain, tingling or numbness of his extremities.  He reports having a good meal today and keeping himself hydrated.  Denies any prior falls.  Denies any travel or sick contacts.  Denies any change in his medications.  At baseline he is quite active.  Hospital course In the ED vitals were stable.  Blood work showed normal CBC and chemistry.  Blood glucose was elevated at  257.  Mildly elevated BUN of 25.  X-ray of the hip showed displaced right intertrochanteric fracture.  Chest x-ray was unremarkable.  EKG pending. [place] orthopedic surgeon Dr. Delfino Lovett consulted by ED physician who recommended transfer to [facility] Course  84 y.o.male,history of hypertensive heart disease and history of diastolic dysfunction, hyperlipidemia and diabetes mellitus on oral hypoglycemic who follows with [facility] cardiology every 6-12 months, presents with mechanical fall, sustained right hip fracture,, status post surgical repair by Dr. Lyla Glassing   Closed comminuted intertrochanteric fracture of proximal femur, right -Discussed with the patient, it does appear to be secondary to mechanical fall he denies any loss of consciousness  or lightheadedness, . -orthopedic input greatly appreciated, status post surgical repair,  -seen By PT, recommendation for home with home health  -Lovenox for DVT prophylaxis to finish total of 30 days   Chronic diastolic CHF  -Appears to be euvolemic  Hypertension - Continue beta-blocker and HCTZ , blood pressure is acceptable  Acute blood loss anemia -Postoperative, expected, started on  iron supplements  Hypokalemia -Repleted,  Diabetes mellitustype II, controlled with hyperglycemia(HCC) - resume home dose glipizide,  Asymmetric septal hypertrophy/mild aortic insufficiency - Follows with [place] every 6 months (last seen in April 2019)  Hyperlipidemia - continue with home dose statin        Discharge Condition:  stable  Follow UP  Follow-up Information    S[place], Aaron Edelman, MD. Schedule an appointment as soon as possible for a visit in 2 weeks.   Specialty:  Orthopedic Surgery Why:  For wound re-check Contact information: [address] [place] [address] [phone]        [facility].   Specialty:  [place] Why:  A representative from [facility] will contact you to arrange start date and time for your therapy. Contact information: [address] [place] [address] [phone]             Discharge Instructions  and  Discharge Medications    Discharge Instructions    Discharge instructions   Complete by:  As directed    Follow with Primary MD Asencion Noble, MD in 7 days   Get CBC, CMP, checked  by Primary MD next visit.    Activity: per PT recommendation   Disposition Home with home health    Diet: Heart Healthy  , with feeding assistance and aspiration precautions.  For Heart failure patients - Check your Weight same time everyday, if you gain over 2 pounds, or you develop in leg swelling, experience more shortness of breath or chest pain, call your Primary  MD immediately. Follow Cardiac Low Salt Diet and 1.5 lit/day fluid restriction.   On your next visit with your primary care physician please Get Medicines reviewed and adjusted.   Please request your Prim.MD to go over all Hospital Tests and Procedure/Radiological results at the follow up, please get all Hospital records sent to your Prim MD by signing hospital release before you go home.   If you experience worsening of your admission symptoms, develop shortness of breath, life threatening emergency, suicidal or homicidal thoughts you must seek medical attention immediately by calling 911 or calling your MD immediately  if symptoms less severe.  You Must read complete instructions/literature along with all the possible adverse reactions/side effects for all the Medicines you take and that have been prescribed to you. Take any new Medicines after you  have completely understood and accpet all the possible adverse reactions/side effects.   Do not drive, operating heavy machinery, perform activities at heights, swimming or participation in water activities or provide baby sitting services if your were admitted for syncope or siezures until you have seen by Primary MD or a Neurologist and advised to do so again.  Do not drive when taking Pain medications.    Do not take more than prescribed Pain, Sleep and Anxiety Medications  Special Instructions: If you have smoked or chewed Tobacco  in the last 2 yrs please stop smoking, stop any regular Alcohol  and or any Recreational drug use.  Wear Seat belts while driving.   Please note  You were cared for by a hospitalist during your hospital stay. If you have any questions about your discharge medications or the care you received while you were in the hospital after you are discharged, you can call the unit and asked to speak with the hospitalist on call if the hospitalist that took care of you is not available. Once you are discharged, your primary  care physician will handle any further medical issues. Please note that NO REFILLS for any discharge medications will be authorized once you are discharged, as it is imperative that you return to your primary care physician (or establish a relationship with a primary care physician if you do not have one) for your aftercare needs so that they can reassess your need for medications and monitor your lab values.     Allergies as of 12/12/2017      Reactions   Enalapril Maleate Other (See Comments)   swelling   Losartan Other (See Comments)   Makes him feel jittery   Menthol Itching   Vasotec Swelling   Sulfa Antibiotics Other (See Comments)   Doesn't recall reaction   Sulfamethoxazole Other (See Comments)   Doesn't recall reaction      Medication List    STOP taking these medications   aspirin 81 MG tablet Replaced by:  aspirin 81 MG chewable tablet     TAKE these medications   ACCU-CHEK AVIVA PLUS test strip Generic drug:  glucose blood   ACCU-CHEK AVIVA PLUS w/Device Kit   ACCU-CHEK SOFTCLIX LANCETS lancets   amLODipine 10 MG tablet Commonly known as:  NORVASC Take 1 tablet (10 mg total) by mouth daily.   aspirin 81 MG chewable tablet Chew 1 tablet (81 mg total) by mouth daily. Replaces:  aspirin 81 MG tablet   atorvastatin 10 MG tablet Commonly known as:  LIPITOR TAKE ONE-HALF TABLET BY MOUTH DAILY   carvedilol 6.25 MG tablet Commonly known as:  COREG Take 1 tablet (6.25 mg total) by mouth 2 (two) times daily with a meal.   enoxaparin 40 MG/0.4ML injection Commonly known as:  LOVENOX Inject 0.4 mLs (40 mg total) into the skin daily. Please take for 26 days for DVT prophylaxis   ferrous sulfate 325 (65 FE) MG EC tablet Take 1 tablet (325 mg total) by mouth 2 (two) times daily.   fexofenadine 180 MG tablet Commonly known as:  ALLEGRA Take 180 mg by mouth daily.   glipiZIDE 5 MG 24 hr tablet Commonly known as:  GLUCOTROL XL Take 5 mg by mouth daily.     glucosamine-chondroitin 500-400 MG tablet Take 1 tablet by mouth 3 (three) times daily.   hydrochlorothiazide 25 MG tablet Commonly known as:  HYDRODIURIL Take 1 tablet (25 mg total) by mouth daily.   HYDROcodone-acetaminophen 5-325 MG tablet  Commonly known as:  NORCO/VICODIN Take 1-2 tablets by mouth every 6 (six) hours as needed (postop hip pain).   latanoprost 0.005 % ophthalmic solution Commonly known as:  XALATAN Place 1 drop into the right eye daily.   MAGNESIUM PO Take 400 mg by mouth daily.   OCUVITE PRESERVISION PO Take 1 tablet by mouth daily.   OVER THE COUNTER MEDICATION Take 2 capsules by mouth daily. Co Q 10 + NAC   OVER THE COUNTER MEDICATION Take 1 tablet by mouth 2 (two) times daily. Med Name: ANTIIVA   pyridOXINE 50 MG tablet Commonly known as:  VITAMIN B-6 Take 25 mg by mouth daily.   VITAMIN B 12 PO Take 1,000 mcg by mouth daily.   Vitamin D-3 1000 units Caps Take 1,000 Units by mouth daily.         Diet and Activity recommendation: See Discharge Instructions above   Consults obtained - Orthopedic   Major procedures and Radiology Reports - PLEASE review detailed and final reports for all details, in brief -    INTRAMEDULLARY (IM) NAIL INTERTROCHANTRIC (Right    Dg Chest 1 View  Result Date: 12/09/2017 CLINICAL DATA:  Fall with right hip pain. EXAM: CHEST  1 VIEW COMPARISON:  None. FINDINGS: Lungs are adequately inflated without consolidation, effusion or pneumothorax. Cardiomediastinal silhouette is within normal. Mild degenerate change of the spine. IMPRESSION: No acute findings. Electronically Signed   By: Marin Olp M.D.   On: 12/09/2017 12:59   Ct Head Wo Contrast  Result Date: 12/09/2017 CLINICAL DATA:  84 year old fell on concrete floor. Altered level of consciousness. EXAM: CT HEAD WITHOUT CONTRAST TECHNIQUE: Contiguous axial images were obtained from the base of the skull through the vertex without intravenous contrast.  COMPARISON:  Cervical spine CT 12/05/2006 FINDINGS: Brain: Diffuse cerebral edema. Low-density in the white matter suggesting chronic changes. No evidence for acute hemorrhage, mass lesion, midline shift, hydrocephalus or large infarct. Suspect old lacune infarcts in left basal ganglia. Vascular: No hyperdense vessel or unexpected calcification. Skull: Normal. Negative for fracture or focal lesion. Sinuses/Orbits: No acute finding. Nasal septum is deviated towards the left. Other: None IMPRESSION: No acute intracranial abnormality. Atrophy and evidence for chronic small vessel ischemic disease. Electronically Signed   By: Markus Daft M.D.   On: 12/09/2017 12:41   Pelvis Portable  Result Date: 12/10/2017 CLINICAL DATA:  Postop right hip fracture EXAM: PORTABLE PELVIS 1-2 VIEWS COMPARISON:  12/09/2017 FINDINGS: Pubic symphysis and rami are intact. Interval intramedullary rod and screw fixation of the proximal right femur for comminuted intertrochanteric fracture. Displaced lesser trochanteric fracture fragment. No femoral head dislocation. Gas in the soft tissues consistent with recent operative status IMPRESSION: Interval surgical fixation of right intertrochanteric fracture with expected surgical changes. Electronically Signed   By: Donavan Foil M.D.   On: 12/10/2017 02:46   Dg Knee Right Port  Result Date: 12/09/2017 CLINICAL DATA:  Hip fracture EXAM: PORTABLE RIGHT KNEE - 1-2 VIEW COMPARISON:  12/09/2017 FINDINGS: No acute displaced fracture or malalignment. Medial femoral hemi arthroplasty with intact hardware. Surface irregularity and sclerosis of the proximal, medial tibia. Moderate degenerative change of the patellofemoral and lateral joint space compartments with joint space calcification. Trace knee effusion. Vascular calcification. IMPRESSION: 1. No acute osseous abnormality. 2. Postsurgical changes medial joint with articular surface irregularity and sclerosis of the medial proximal tibia. 3.  Chondrocalcinosis.  Moderate arthritis of the knee. Electronically Signed   By: Donavan Foil M.D.   On: 12/09/2017 19:29  Dg C-arm 1-60 Min  Result Date: 12/10/2017 CLINICAL DATA:  Right femur fracture EXAM: RIGHT FEMUR 2 VIEWS; DG C-ARM 61-120 MIN COMPARISON:  12/09/2016 FINDINGS: Three low resolution intraoperative spot views of the right femur. Intraoperative fluoroscopy time was 1 minutes 22 seconds. The images demonstrate intramedullary rod and screw fixation of the proximal right femur for intertrochanteric fracture. Near anatomic alignment. IMPRESSION: Intraoperative fluoroscopic assistance provided during surgical fixation of right intertrochanteric fracture Electronically Signed   By: Donavan Foil M.D.   On: 12/10/2017 02:43   Dg Hip Unilat W Or Wo Pelvis 2-3 Views Right  Result Date: 12/09/2017 CLINICAL DATA:  Fall with right hip pain. EXAM: DG HIP (WITH OR WITHOUT PELVIS) 2-3V RIGHT COMPARISON:  None. FINDINGS: Exam demonstrates a displaced intertrochanteric fracture of the right hip. There is displacement of the lesser trochanter. Mild symmetric degenerative change of the hips. Degenerate change of the spine. IMPRESSION: Displaced right intertrochanteric fracture. Electronically Signed   By: Marin Olp M.D.   On: 12/09/2017 12:58   Dg Femur, Min 2 Views Right  Result Date: 12/10/2017 CLINICAL DATA:  Right femur fracture EXAM: RIGHT FEMUR 2 VIEWS; DG C-ARM 61-120 MIN COMPARISON:  12/09/2016 FINDINGS: Three low resolution intraoperative spot views of the right femur. Intraoperative fluoroscopy time was 1 minutes 22 seconds. The images demonstrate intramedullary rod and screw fixation of the proximal right femur for intertrochanteric fracture. Near anatomic alignment. IMPRESSION: Intraoperative fluoroscopic assistance provided during surgical fixation of right intertrochanteric fracture Electronically Signed   By: Donavan Foil M.D.   On: 12/10/2017 02:43    Micro Results    No  results found for this or any previous visit (from the past 240 hour(s)).     Today   Subjective:   Zebedee Segundo today has no headache,no chest or abdominal pain,no new weakness tingling or numbness, feels much better today.   Objective:   Blood pressure 138/61, pulse 72, temperature 98.6 F (37 C), temperature source Oral, resp. rate 17, height _0  (1.803 m), weight 74.8 kg (165 lb), SpO2 93 %.   Intake/Output Summary (Last 24 hours) at 12/12/2017 1107 Last data filed at 12/12/2017 0530 Gross per 24 hour  Intake 240 ml  Output 1000 ml  Net -760 ml    Exam Awake Alert, Oriented x 3, No new F.N deficits, Normal affect Symmetrical Chest wall movement, Good air movement bilaterally, CTAB RRR,No Gallops,Rubs or new Murmurs, No Parasternal Heave +ve B.Sounds, Abd Soft, Non tender,No rebound -guarding or rigidity. No Cyanosis, Clubbing or edema, No new Rash or bruise, right hip bandage looks dry, no bleeding or oozing    Data Review   CBC w Diff:  Lab Results  Component Value Date   WBC 9.3 12/12/2017   HGB 10.2 (L) 12/12/2017   HCT 30.8 (L) 12/12/2017   PLT 121 (L) 12/12/2017   LYMPHOPCT 20 12/09/2017   MONOPCT 6 12/09/2017   EOSPCT 1 12/09/2017   BASOPCT 0 12/09/2017    CMP:  Lab Results  Component Value Date   NA 137 12/11/2017   K 3.4 (L) 12/11/2017   CL 104 12/11/2017   CO2 28 12/11/2017   BUN 17 12/11/2017   CREATININE 0.88 12/11/2017   PROT 7.1 12/09/2017   ALBUMIN 4.0 12/09/2017   BILITOT 1.2 12/09/2017   ALKPHOS 60 12/09/2017   AST 31 12/09/2017   ALT 33 12/09/2017  .   Total Time in preparing paper work, data evaluation and todays exam - 35 minutes  Janaki Exley  Madaleine Simmon M.D on 12/12/2017 at 11:07 AM  [facility]   Office  [phone]

## 2017-12-15 ENCOUNTER — Ambulatory Visit (HOSPITAL_COMMUNITY)
Admission: RE | Admit: 2017-12-15 | Discharge: 2017-12-15 | Disposition: A | Discharge status: Home / Self Care | Payer: Medicare Other | Source: Ambulatory Visit | Attending: Internal Medicine | Admitting: Internal Medicine

## 2017-12-15 ENCOUNTER — Other Ambulatory Visit (HOSPITAL_COMMUNITY): Payer: Self-pay | Admitting: Internal Medicine

## 2017-12-15 DIAGNOSIS — N400 Enlarged prostate without lower urinary tract symptoms: Secondary | ICD-10-CM | POA: Diagnosis not present

## 2017-12-15 DIAGNOSIS — N3289 Other specified disorders of bladder: Secondary | ICD-10-CM | POA: Insufficient documentation

## 2018-01-09 ENCOUNTER — Telehealth: Payer: Self-pay | Admitting: Cardiology

## 2018-01-09 NOTE — Telephone Encounter (Signed)
Spoke with pt wife, aware of the recommendations. She will call with concerns regarding bp.

## 2018-01-09 NOTE — Telephone Encounter (Signed)
Spoke with pt wife, the patient is recovering from a recent broken bone and he suddenly got dizzy today while sitting still. His bp was 90/50 with pulse of 60 bpm. He is currently lying in bed with his legs elevated and bp is 120/70 64bpm. He is taking tylenol and tramadol around the clock for pain. His medications were confirmed. She reports he has good oral intake. They will continue to hydrate the patient today and orthostatic precautions discussed. The dizziness is completely gone now. Wife was told to hold carvedilol dose tonight if bp was 90's and to give him 1/2 the carvedilol dose tonight if bp 120's. Will forward for dr jordan's review.

## 2018-01-09 NOTE — Telephone Encounter (Signed)
New message   Pt c/o BP issue: STAT if pt c/o blurred vision, one-sided weakness or slurred speech  1. What are your last 5 BP readings? 120/80 140/80 90/50  2. Are you having any other symptoms (ex. Dizziness, headache, blurred vision, passed out)? Dizziness, Blurred vision at 12:20 pm   3. What is your BP issue?  Blood pressure is dropping low

## 2018-01-09 NOTE — Telephone Encounter (Signed)
Agree with approach.  Continue to monitor blood pressures and notify us if blood pressures less than 100 mmHg systolic.

## 2018-01-16 ENCOUNTER — Other Ambulatory Visit (HOSPITAL_COMMUNITY): Payer: Self-pay | Admitting: Internal Medicine

## 2018-01-16 DIAGNOSIS — S72001A Fracture of unspecified part of neck of right femur, initial encounter for closed fracture: Secondary | ICD-10-CM

## 2018-01-19 ENCOUNTER — Telehealth: Payer: Self-pay | Admitting: Cardiology

## 2018-01-19 NOTE — Telephone Encounter (Signed)
New Message   STAT if patient feels like he/she is going to faint   1) Are you dizzy now? no  2) Do you feel faint or have you passed out? no  3) Do you have any other symptoms? Low blood pressure  4) Have you checked your HR and BP (record if available)? Bp: 104/60, 110/70

## 2018-01-19 NOTE — Telephone Encounter (Signed)
Spoke with patient's wife. She states that she is still concerned with husbands blood pressure is continuing to be low.   Today it was 104/60 with complaints of dizziness. I advised with patient the discussion from last phone note from 07/23. Patient would just like to know if the same changes should be made as last time to hold Carvedilol if BP in 90's, wife and patient are concerned with dizziness as it comes and goes and BP is low and then after dizzy spell BP goes back to normal.  Please advise. Thank you!

## 2018-01-20 NOTE — Telephone Encounter (Signed)
I would reduce HCTZ to 12.5 mg daily  Peter SwazilandJordan MD, [facility]

## 2018-01-22 NOTE — Telephone Encounter (Signed)
Spoke with pt wife, aware of dr jordan's recommendation. The wife also reports the patient was started on flomax and she feels like that may have been the problem. They will decrease the HCTZ and let us know of any problems.

## 2018-01-23 ENCOUNTER — Ambulatory Visit: Payer: Medicare Other | Admitting: Urology

## 2018-01-23 DIAGNOSIS — N401 Enlarged prostate with lower urinary tract symptoms: Secondary | ICD-10-CM

## 2018-01-23 DIAGNOSIS — R351 Nocturia: Secondary | ICD-10-CM

## 2018-01-31 ENCOUNTER — Ambulatory Visit (HOSPITAL_COMMUNITY)
Admission: RE | Admit: 2018-01-31 | Discharge: 2018-01-31 | Disposition: A | Discharge status: Home / Self Care | Payer: Medicare Other | Source: Ambulatory Visit | Attending: Internal Medicine | Admitting: Internal Medicine

## 2018-01-31 DIAGNOSIS — M8580 Other specified disorders of bone density and structure, unspecified site: Secondary | ICD-10-CM | POA: Diagnosis not present

## 2018-01-31 DIAGNOSIS — Z13820 Encounter for screening for osteoporosis: Secondary | ICD-10-CM | POA: Diagnosis present

## 2018-01-31 DIAGNOSIS — S72001A Fracture of unspecified part of neck of right femur, initial encounter for closed fracture: Secondary | ICD-10-CM | POA: Diagnosis present

## 2018-01-31 DIAGNOSIS — X58XXXA Exposure to other specified factors, initial encounter: Secondary | ICD-10-CM | POA: Insufficient documentation

## 2018-03-27 ENCOUNTER — Ambulatory Visit: Payer: Medicare Other | Admitting: Urology

## 2018-03-27 DIAGNOSIS — N401 Enlarged prostate with lower urinary tract symptoms: Secondary | ICD-10-CM | POA: Diagnosis not present

## 2018-03-27 DIAGNOSIS — R351 Nocturia: Secondary | ICD-10-CM | POA: Diagnosis not present

## 2018-06-02 ENCOUNTER — Other Ambulatory Visit: Payer: Self-pay | Admitting: Cardiology

## 2018-06-07 NOTE — Telephone Encounter (Signed)
°*  STAT* If patient is at the pharmacy, call can be transferred to refill team.   1. Which medications need to be refilled? (please list name of each medication and dose if known)  Atorvastatin  2. Which pharmacy/location (including street and city if local pharmacy) is medication to be sent to?[facility] [phone] 3. Do they need a 30 day or 90 day supply? 45 and refills

## 2018-06-07 NOTE — Telephone Encounter (Signed)
Order Providers   Prescribing Provider Encounter Provider  SwazilandJordan, Peter M, MD Swazil[place]Jordan, Peter M, MD  Outpatient Medication Detail    Disp Refills Start End   atorvastatin (LIPITOR) 10 MG tablet 45 tablet 2 10/31/2017    Sig: TAKE ONE-HALF TABLET BY MOUTH DAILY   Sent to pharmacy as: atorvastatin (LIPITOR) 10 MG tablet   E-Prescribing Status: Receipt confirmed by pharmacy (10/31/2017 9:56 AM EDT)   Pharmacy   [facility] - [place], [place] - [address]

## 2018-07-07 ENCOUNTER — Other Ambulatory Visit: Payer: Self-pay | Admitting: Cardiology

## 2018-07-09 NOTE — Telephone Encounter (Signed)
Rx has been sent to the pharmacy electronically. ° °

## 2018-08-01 IMAGING — DX DG PORTABLE PELVIS
1 series · 2 of 2 positions shown · non-contrast
Comparison: 12/09/2017

CLINICAL DATA: Postop right hip fracture

EXAM:
PORTABLE PELVIS 1-2 VIEWS

[Series 1: pelvis · 0.14mm/px · 2 of 2 slices shown]
[im 1/2]
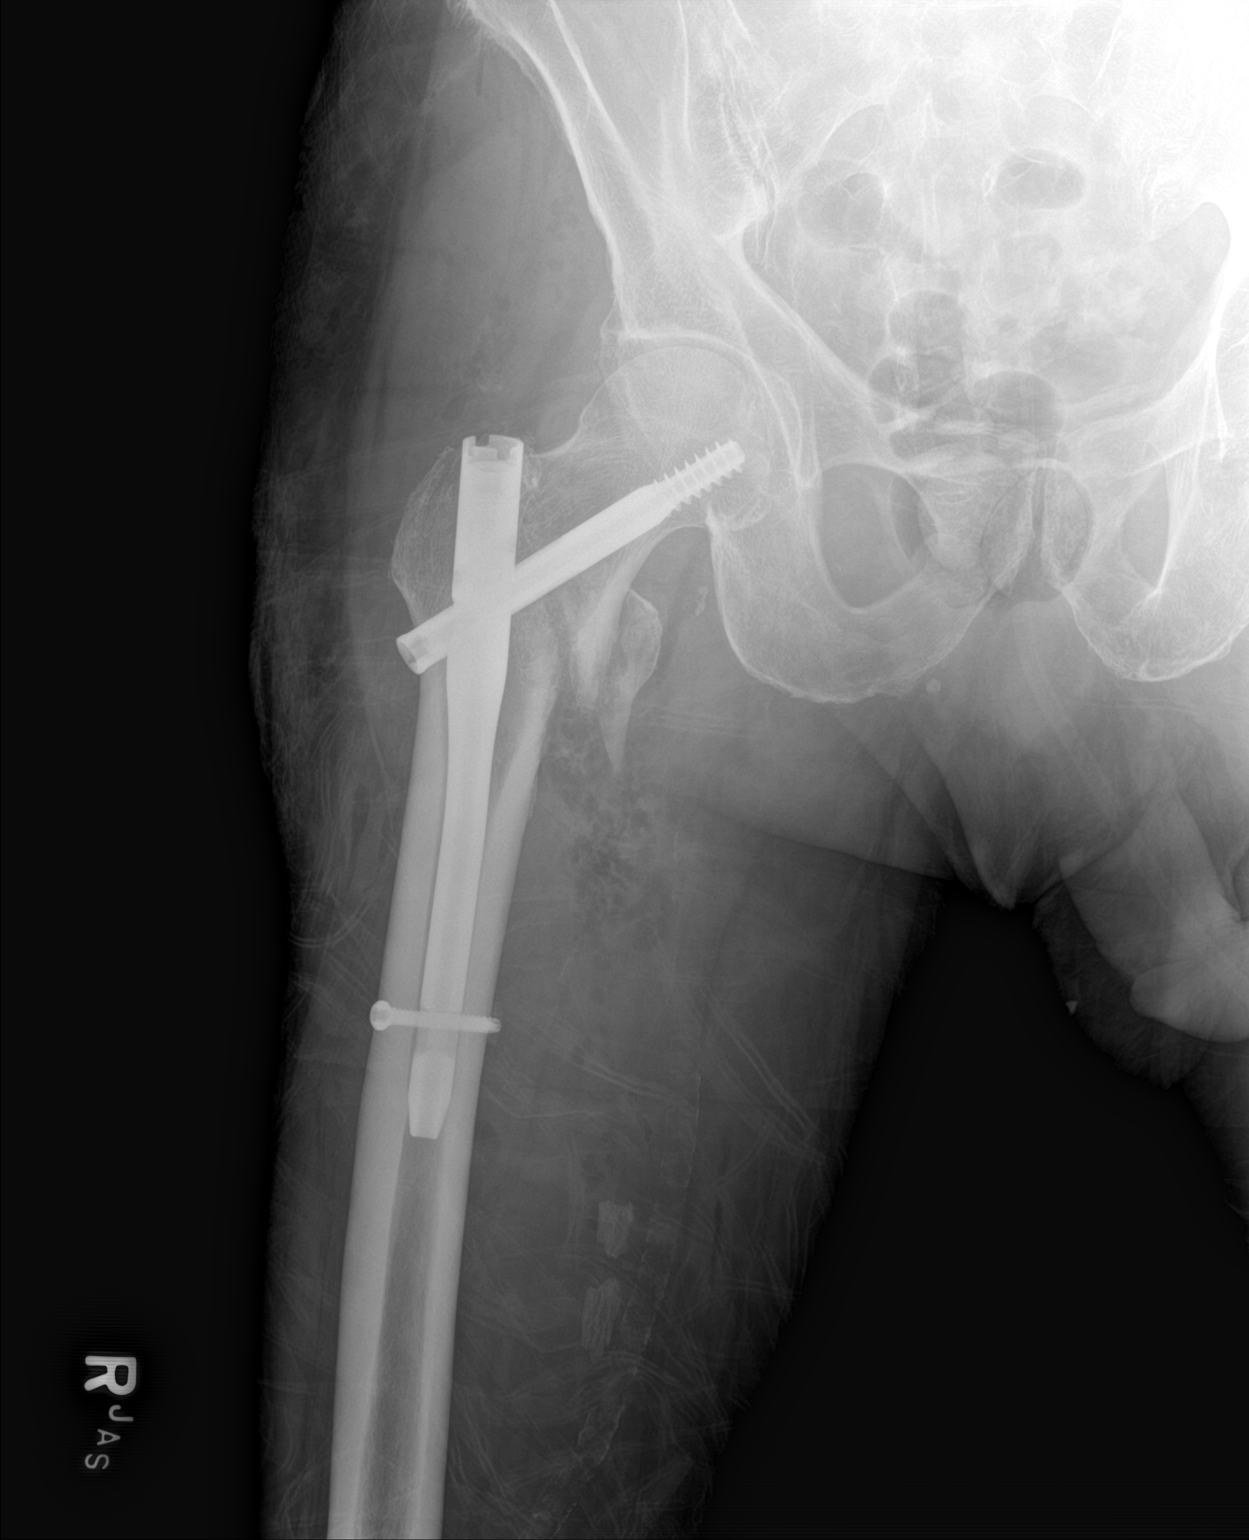
[im 2/2]
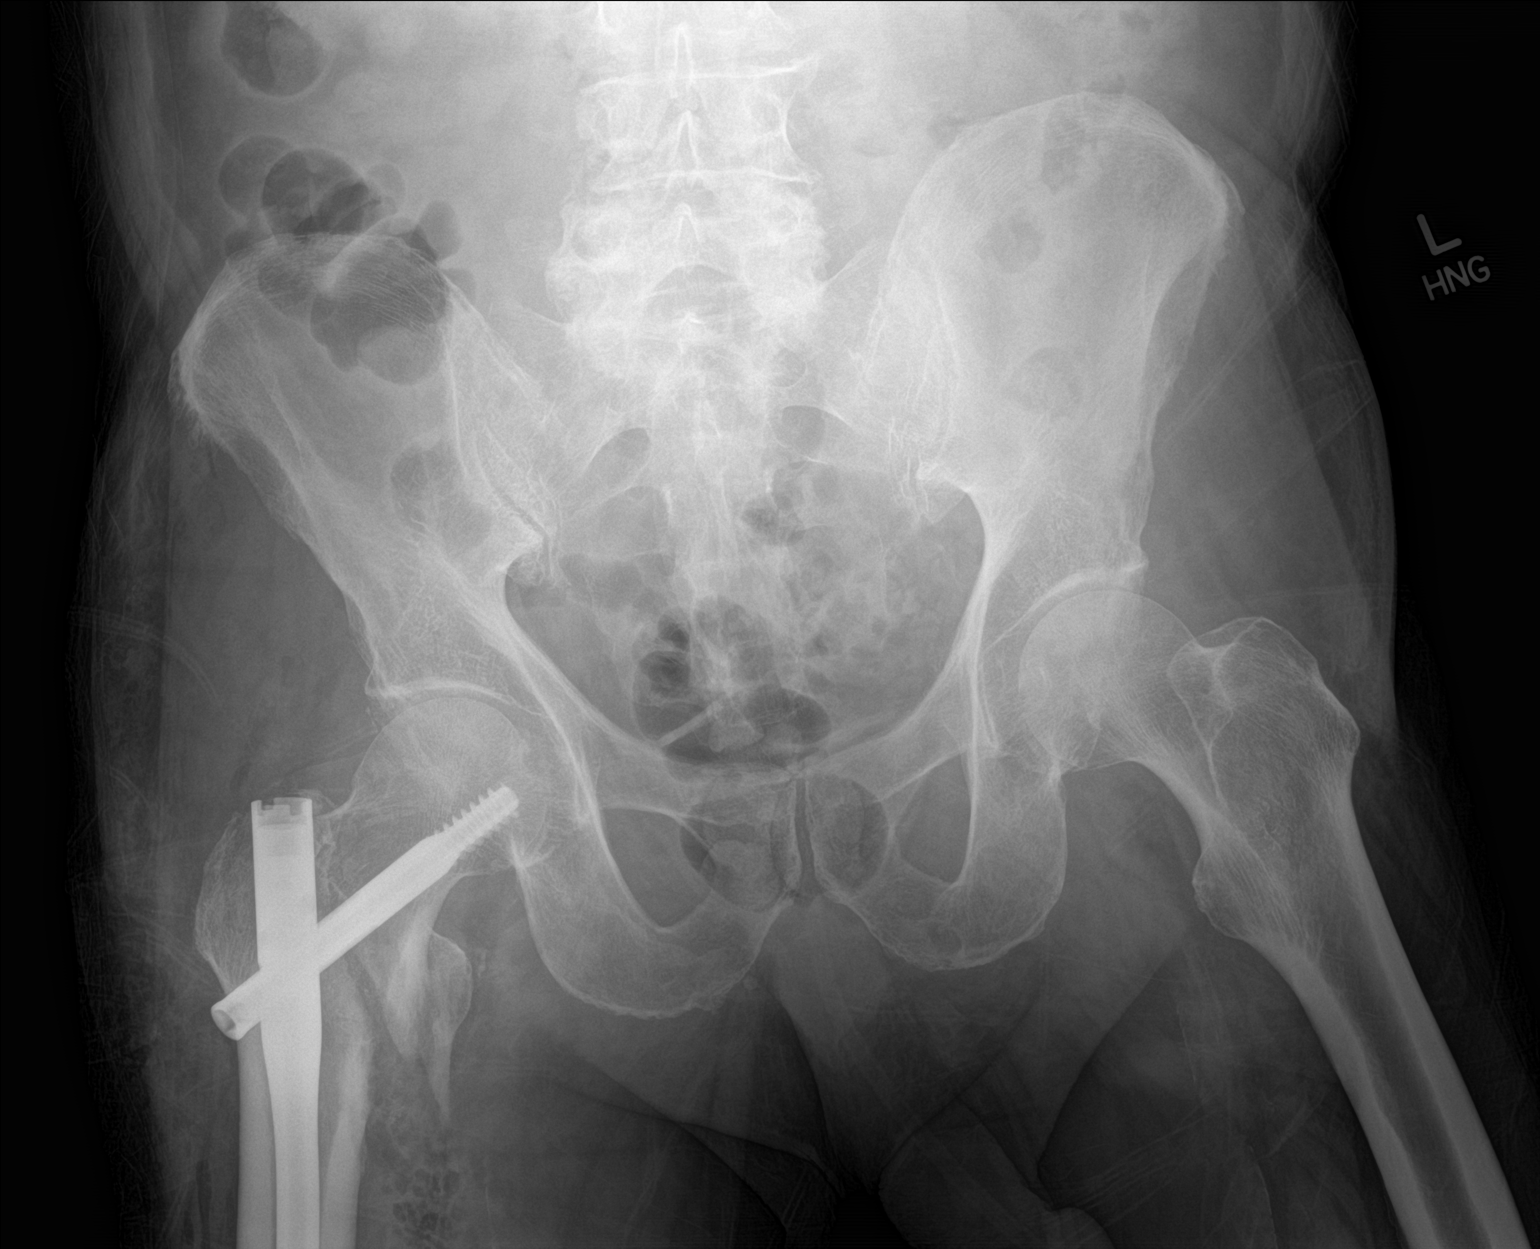

[2 of 2 positions shown; findings below may reference images not displayed]

FINDINGS: Pubic symphysis and rami are intact. Interval intramedullary rod and
screw fixation of the proximal right femur for comminuted
intertrochanteric fracture. Displaced lesser trochanteric fracture
fragment. No femoral head dislocation. Gas in the soft tissues
consistent with recent operative status
IMPRESSION: Interval surgical fixation of right intertrochanteric fracture with
expected surgical changes.

## 2018-09-04 ENCOUNTER — Other Ambulatory Visit: Payer: Self-pay

## 2018-09-04 DIAGNOSIS — R6889 Other general symptoms and signs: Secondary | ICD-10-CM

## 2018-09-16 LAB — NOVEL CORONAVIRUS, NAA: SARS-COV-2, NAA: NOT DETECTED

## 2018-10-09 ENCOUNTER — Other Ambulatory Visit: Payer: Self-pay | Admitting: Cardiology

## 2018-10-18 NOTE — Progress Notes (Deleted)
{Choose 1 Note Type (Telehealth Visit or Telephone Visit):[phone]}   Evaluation Performed:  Follow-up visit  Date:  10/18/2018   ID:  Shawn Stephenson, DOB 11/01/1929, MRN [id number]  {Patient [phone]::"Home"} {Provider [phone]}  PCP:  Carylon Perches, MD  Cardiologist:  Nolyn Eilert [place] MD Electrophysiologist:  None   Chief Complaint:  ***  History of Present Illness:    Shawn Stephenson is a 85 y.o. male with a past history of frequent PVCs and a past history of hypertensive cardiovascular disease and a history of diastolic dysfunction of the left ventricle. He also has hypercholesterolemia. He is a mild diabetic. The patient had a Myoview stress test on 10/01/13 which showed no ischemia and showed a very small apical scar. The study was not gated because of PVCs and PACs. His last echocardiogram was 02/10/14 and showed normal left ventricular systolic function with ejection fraction of 65%. There was aortic valve sclerosis without stenosis.  In June 2019 he fell fracturing his right femur and had surgery.   His  Daughter Shawn Stephenson is a patient of mine and had septal ablation therapy for hypertrophic obstructive cardiomyopathy done at [facility]. She had a excellent result and did well.  The patient {does/does not:200015} have symptoms concerning for COVID-19 infection (fever, chills, cough, or new shortness of breath).    Past Medical History:  Diagnosis Date  . Asymptomatic PVCs   . Diabetes mellitus    well controlled  . Diastolic dysfunction   . Hyperlipidemia   . Hypertension   . Mitral regurgitation    mild   Past Surgical History:  Procedure Laterality Date  . INTRAMEDULLARY (IM) NAIL INTERTROCHANTERIC Right 12/09/2017   Procedure: INTRAMEDULLARY (IM) NAIL INTERTROCHANTRIC;  Surgeon: Samson Frederic, MD;  Location: [facility];  Service: Orthopedics;  Laterality: Right;     No outpatient medications have been marked as taking for the 10/24/18  encounter (Appointment) with [place], Lovena Kluck M, MD.     Allergies:   Enalapril maleate; Losartan; Menthol; Vasotec; Sulfa antibiotics; and Sulfamethoxazole   Social History   Tobacco Use  . Smoking status: Never Smoker  . Smokeless tobacco: Never Used  Substance Use Topics  . Alcohol use: No  . Drug use: No     Family Hx: The patient's family history includes Arthritis in his mother; Diabetes in his father; Hypertension in his brother, father, and mother; Kidney disease in his brother; Stroke in his brother and father.  ROS:   Please see the history of present illness.    *** All other systems reviewed and are negative.   Prior CV studies:   The following studies were reviewed today:  Echo 12/10/17: Study Conclusions  - Left ventricle: The cavity size was normal. There was moderate   focal basal hypertrophy of the septum. Systolic function was   vigorous. The estimated ejection fraction was in the range of 65%   to 70%. Wall motion was normal; there were no regional wall   motion abnormalities. Doppler parameters are consistent with   abnormal left ventricular relaxation (grade 1 diastolic   dysfunction). - Aortic valve: Trileaflet; mildly thickened, mildly calcified   leaflets. There was mild regurgitation.  Labs/Other Tests and Data Reviewed:    EKG:  {EKG/Telemetry Strips Reviewed:[phone]}  Recent Labs: 12/09/2017: ALT 33; Magnesium 2.1 12/11/2017: BUN 17; Creatinine, Ser 0.88; Potassium 3.4; Sodium 137 12/12/2017: Hemoglobin 10.2; Platelets 121   Recent Lipid Panel No results found for: CHOL, TRIG, HDL, CHOLHDL, LDLCALC, LDLDIRECT  Wt Readings from  Last 3 Encounters:  12/10/17 165 lb (74.8 kg)  09/19/17 166 lb 6.4 oz (75.5 kg)  08/12/16 167 lb (75.8 kg)     Objective:    Vital Signs:  There were no vitals taken for this visit.   {HeartCare Virtual Exam (Optional):[phone]::"VITAL SIGNS:  reviewed"}  ASSESSMENT & PLAN:    1. Hypertensive heart  disease without heart failure. BP is well controlled.  He is asymptomatic. Continue current Rx.  2. Diastolic dysfunction 3. PVCs 4. Aortic valve disease with mild aortic insufficiency   COVID-19 Education: The signs and symptoms of COVID-19 were discussed with the patient and how to seek care for testing (follow up with PCP or arrange E-visit).  ***The importance of social distancing was discussed today.  Time:   Today, I have spent *** minutes with the patient with telehealth technology discussing the above problems.     Medication Adjustments/Labs and Tests Ordered: Current medicines are reviewed at length with the patient today.  Concerns regarding medicines are outlined above.   Tests Ordered: No orders of the defined types were placed in this encounter.   Medication Changes: No orders of the defined types were placed in this encounter.   Disposition:  Follow up {follow up:15908}  Signed, Che Below Swazil[place]Jordan, MD  10/18/2018 12:51 PM    [facility]

## 2018-10-23 ENCOUNTER — Telehealth: Payer: Self-pay | Admitting: Cardiology

## 2018-10-24 ENCOUNTER — Telehealth: Payer: Medicare Other | Admitting: Cardiology

## 2018-11-05 ENCOUNTER — Other Ambulatory Visit: Payer: Self-pay | Admitting: Cardiology

## 2018-11-06 ENCOUNTER — Telehealth: Payer: Self-pay | Admitting: Cardiology

## 2018-11-06 NOTE — Telephone Encounter (Signed)
New message:    Pharmacy calling needing to speak with some one concering this patient medication.

## 2018-12-17 NOTE — Telephone Encounter (Signed)
Opened in error

## 2019-01-11 ENCOUNTER — Other Ambulatory Visit: Payer: Self-pay | Admitting: Cardiology

## 2019-02-07 ENCOUNTER — Other Ambulatory Visit: Payer: Self-pay | Admitting: Cardiology

## 2019-03-07 NOTE — Progress Notes (Signed)
Cardiology Office Note   Date:  03/08/2019   ID:  Shawn Stephenson, DOB 1929/07/15, MRN [id number]  PCP:  Asencion Noble, MD  Cardiologist: Peter Martinique MD  Chief Complaint  Patient presents with  . Hypertension  . Palpitations      History of Present Illness: Shawn Stephenson is a 85 y.o. male who presents for follow up PVCs and hypertensive heart disease.   He has a past history of frequent PVCs and a past history of hypertensive cardiovascular disease and a history of diastolic dysfunction of the left ventricle. He also has hypercholesterolemia. He is a mild diabetic. The patient had a Myoview stress test on 10/01/13 which showed no ischemia and showed a very small apical scar. The study was not gated because of PVCs and PACs. His last echocardiogram was 02/10/14 and showed normal left ventricular systolic function with ejection fraction of 65%. There was aortic valve sclerosis without stenosis. Most recent Echo in June 2019 showed focal basal septal hypertrophy otherwise normal LV.   His  Daughter Tonna Boehringer is a patient of mine and had septal myectomy therapy for hypertrophic obstructive cardiomyopathy done at [facility] clinic.  On follow up today he is doing very well. He denies any chest pain, dyspnea, palpitations or dizziness. He is very inactive and has gained 13 lbs in the past year.   Past Medical History:  Diagnosis Date  . Asymptomatic PVCs   . Diabetes mellitus    well controlled  . Diastolic dysfunction   . Hyperlipidemia   . Hypertension   . Mitral regurgitation    mild    Past Surgical History:  Procedure Laterality Date  . INTRAMEDULLARY (IM) NAIL INTERTROCHANTERIC Right 12/09/2017   Procedure: INTRAMEDULLARY (IM) NAIL INTERTROCHANTRIC;  Surgeon: Rod Can, MD;  Location: [place];  Service: Orthopedics;  Laterality: Right;     Current Outpatient Medications  Medication Sig Dispense Refill  . ACCU-CHEK AVIVA PLUS test strip     . ACCU-CHEK  SOFTCLIX LANCETS lancets     . amLODipine (NORVASC) 10 MG tablet Take 1 tablet (10 mg total) by mouth daily. 90 tablet 3  . aspirin 81 MG chewable tablet Chew 1 tablet (81 mg total) by mouth daily. 60 tablet 1  . atorvastatin (LIPITOR) 10 MG tablet TAKE ONE-HALF TABLET BY MOUTH DAILY 45 tablet 1  . Blood Glucose Monitoring Suppl (ACCU-CHEK AVIVA PLUS) W/DEVICE KIT     . carvedilol (COREG) 6.25 MG tablet TAKE ONE TABLET BY MOUTH TWICE A DAY WITH A MEAL 60 tablet 0  . Cholecalciferol (VITAMIN D-3) 1000 UNITS CAPS Take 1,000 Units by mouth daily.    . Cyanocobalamin (VITAMIN B 12 PO) Take 1,000 mcg by mouth daily.     . fexofenadine (ALLEGRA) 180 MG tablet Take 180 mg by mouth daily.    Marland Kitchen glipiZIDE (GLUCOTROL) 5 MG 24 hr tablet Take 5 mg by mouth daily.      . hydrochlorothiazide (HYDRODIURIL) 25 MG tablet Take 1 tablet (25 mg total) by mouth daily. (Patient taking differently: Take 12.5 mg by mouth daily. ) 90 tablet 3  . latanoprost (XALATAN) 0.005 % ophthalmic solution Place 1 drop into the right eye daily.    . Lutein-Zeaxanthin 25-5 MG CAPS Take 1 capsule by mouth daily.    Marland Kitchen MAGNESIUM PO Take 400 mg by mouth daily.     Marland Kitchen pyridOXINE (VITAMIN B-6) 50 MG tablet Take 25 mg by mouth daily.     . tamsulosin (FLOMAX) 0.4  MG CAPS capsule Take 1 capsule by mouth daily.     No current facility-administered medications for this visit.     Allergies:   Enalapril maleate, Losartan, Menthol, Vasotec, Sulfa antibiotics, and Sulfamethoxazole    Social History:  The patient  reports that he has never smoked. He has never used smokeless tobacco. He reports that he does not drink alcohol or use drugs.   Family History:  The patient's family history includes Arthritis in his mother; Diabetes in his father; Hypertension in his brother, father, and mother; Kidney disease in his brother; Stroke in his brother and father.    ROS:  Please see the history of present illness.   Otherwise, review of systems are  positive for none.   All other systems are reviewed and negative.    PHYSICAL EXAM: VS:  BP 136/72   Pulse 61   Temp (!) 97.3 F (36.3 C) (Temporal)   Ht 5' 11" (1.803 m)   Wt 179 lb 4.8 oz (81.3 kg)   SpO2 98%   BMI 25.01 kg/m  , BMI Body mass index is 25.01 kg/m. GENERAL:  Well appearing elderly WM in NAD HEENT:  PERRL, EOMI, sclera are clear. Oropharynx is clear. NECK:  No jugular venous distention, carotid upstroke brisk and symmetric, no bruits, no thyromegaly or adenopathy LUNGS:  Clear to auscultation bilaterally CHEST:  Unremarkable HEART:  RRR,  PMI not displaced or sustained,S1 and S2 within normal limits, no S3, no S4: no clicks, no rubs, no murmurs ABD:  Soft, nontender. BS +, no masses or bruits. No hepatomegaly, no splenomegaly EXT:  2 + pulses throughout, no edema, no cyanosis no clubbing SKIN:  Warm and dry.  No rashes NEURO:  Alert and oriented x 3. Cranial nerves II through XII intact. PSYCH:  Cognitively intact   EKG:  EKG is ordered today. Sinus rate 61.  LAFB, RBBB,LVH. Since prior tracings now with complete RBBB.  I have personally reviewed and interpreted this study.  Recent Labs: No results found for requested labs within last 8760 hours.    Lipid Panel No results found for: CHOL, TRIG, HDL, CHOLHDL, VLDL, LDLCALC, LDLDIRECT    Wt Readings from Last 3 Encounters:  03/08/19 179 lb 4.8 oz (81.3 kg)  12/10/17 165 lb (74.8 kg)  09/19/17 166 lb 6.4 oz (75.5 kg)    Labs reviewed from 12/28/15: cholesterol 149, triglycerides 74, LDL 88, HDL 46. Glucose 159, A1c 6.4%. Other chemistries, CBC, UA normal. Dated 02/03/17: cholesterol 134, triglycerides 77, HDL 41. A1c 6.7%. CBC and chemistries normal.  Dated 01/08/18: normal chemistries Dated 02/23/18 normal Hgb Dated 07/06/18 A1c 7.1%.   Echo 12/10/17: Study Conclusions  - Left ventricle: The cavity size was normal. There was moderate   focal basal hypertrophy of the septum. Systolic function was   vigorous.  The estimated ejection fraction was in the range of 65%   to 70%. Wall motion was normal; there were no regional wall   motion abnormalities. Doppler parameters are consistent with   abnormal left ventricular relaxation (grade 1 diastolic   dysfunction). - Aortic valve: Trileaflet; mildly thickened, mildly calcified   leaflets. There was mild regurgitation.  ASSESSMENT AND PLAN:  1. Hypertensive heart disease without heart failure. BP is well controlled.  He is asymptomatic. Continue current Rx. Labs monitored by Dr Willey Blade. I did stress the importance of regular aerobic activity and maintaining a good weight.   2. Diastolic dysfunction 3. PVCs-no symptoms 4. Aortic valve disease with mild  aortic insufficiency  Follow up in one year   Signed,  Martinique MD, [facility]   03/08/2019 12:26 PM    [place]

## 2019-03-08 ENCOUNTER — Ambulatory Visit (INDEPENDENT_AMBULATORY_CARE_PROVIDER_SITE_OTHER): Payer: Medicare Other | Admitting: Cardiology

## 2019-03-08 ENCOUNTER — Encounter: Payer: Self-pay | Admitting: Cardiology

## 2019-03-08 ENCOUNTER — Other Ambulatory Visit: Payer: Self-pay

## 2019-03-08 ENCOUNTER — Other Ambulatory Visit: Payer: Self-pay | Admitting: Cardiology

## 2019-03-08 VITALS — BP 136/72 | HR 61 | Temp 97.3°F | Ht 71.0 in | Wt 179.3 lb

## 2019-03-08 DIAGNOSIS — I493 Ventricular premature depolarization: Secondary | ICD-10-CM

## 2019-03-08 DIAGNOSIS — I351 Nonrheumatic aortic (valve) insufficiency: Secondary | ICD-10-CM

## 2019-03-08 DIAGNOSIS — I119 Hypertensive heart disease without heart failure: Secondary | ICD-10-CM

## 2019-03-08 DIAGNOSIS — I519 Heart disease, unspecified: Secondary | ICD-10-CM

## 2019-04-04 ENCOUNTER — Other Ambulatory Visit: Payer: Self-pay | Admitting: Cardiology

## 2019-05-21 ENCOUNTER — Ambulatory Visit (INDEPENDENT_AMBULATORY_CARE_PROVIDER_SITE_OTHER): Payer: Medicare Other | Admitting: Urology

## 2019-05-21 DIAGNOSIS — R351 Nocturia: Secondary | ICD-10-CM | POA: Diagnosis not present

## 2019-05-21 DIAGNOSIS — N401 Enlarged prostate with lower urinary tract symptoms: Secondary | ICD-10-CM | POA: Diagnosis not present

## 2019-05-30 ENCOUNTER — Other Ambulatory Visit: Payer: Self-pay | Admitting: Cardiology

## 2019-07-20 ENCOUNTER — Other Ambulatory Visit: Payer: Self-pay | Admitting: Cardiology

## 2019-08-12 DIAGNOSIS — E1129 Type 2 diabetes mellitus with other diabetic kidney complication: Secondary | ICD-10-CM | POA: Diagnosis not present

## 2019-08-14 DIAGNOSIS — H2513 Age-related nuclear cataract, bilateral: Secondary | ICD-10-CM | POA: Diagnosis not present

## 2019-08-14 DIAGNOSIS — H401411 Capsular glaucoma with pseudoexfoliation of lens, right eye, mild stage: Secondary | ICD-10-CM | POA: Diagnosis not present

## 2019-08-14 DIAGNOSIS — E1136 Type 2 diabetes mellitus with diabetic cataract: Secondary | ICD-10-CM | POA: Diagnosis not present

## 2019-08-16 DIAGNOSIS — N401 Enlarged prostate with lower urinary tract symptoms: Secondary | ICD-10-CM | POA: Diagnosis not present

## 2019-08-16 DIAGNOSIS — I10 Essential (primary) hypertension: Secondary | ICD-10-CM | POA: Diagnosis not present

## 2019-08-16 DIAGNOSIS — E1129 Type 2 diabetes mellitus with other diabetic kidney complication: Secondary | ICD-10-CM | POA: Diagnosis not present

## 2019-09-03 ENCOUNTER — Other Ambulatory Visit: Payer: Self-pay | Admitting: Cardiology

## 2019-09-03 NOTE — Telephone Encounter (Signed)
*  STAT* If patient is at the pharmacy, call can be transferred to refill team.   1. Which medications need to be refilled? (please list name of each medication and dose if known) carvedilol (COREG) 6.25 MG tablet  2. Which pharmacy/location (including street and city if local pharmacy) is medication to be sent to? [facility] - [place], [place] - [address]  3. Do they need a 30 day or 90 day supply? 90 day

## 2019-09-05 MED ORDER — CARVEDILOL 6.25 MG PO TABS: 6.2500 mg | ORAL_TABLET | Freq: Two times a day (BID) | ORAL | 1 refills | Status: DC

## 2019-09-05 NOTE — Telephone Encounter (Signed)
Rx has been sent to the pharmacy electronically. ° °

## 2019-10-15 DIAGNOSIS — K4090 Unilateral inguinal hernia, without obstruction or gangrene, not specified as recurrent: Secondary | ICD-10-CM | POA: Diagnosis not present

## 2019-10-15 DIAGNOSIS — Z6826 Body mass index (BMI) 26.0-26.9, adult: Secondary | ICD-10-CM | POA: Diagnosis not present

## 2019-10-17 DIAGNOSIS — H43813 Vitreous degeneration, bilateral: Secondary | ICD-10-CM | POA: Diagnosis not present

## 2019-10-17 DIAGNOSIS — E119 Type 2 diabetes mellitus without complications: Secondary | ICD-10-CM | POA: Diagnosis not present

## 2019-10-17 DIAGNOSIS — H25813 Combined forms of age-related cataract, bilateral: Secondary | ICD-10-CM | POA: Diagnosis not present

## 2019-10-17 DIAGNOSIS — H353132 Nonexudative age-related macular degeneration, bilateral, intermediate dry stage: Secondary | ICD-10-CM | POA: Diagnosis not present

## 2019-11-07 DIAGNOSIS — K4091 Unilateral inguinal hernia, without obstruction or gangrene, recurrent: Secondary | ICD-10-CM | POA: Diagnosis not present

## 2019-11-20 DIAGNOSIS — H268 Other specified cataract: Secondary | ICD-10-CM | POA: Diagnosis not present

## 2019-11-20 DIAGNOSIS — H353132 Nonexudative age-related macular degeneration, bilateral, intermediate dry stage: Secondary | ICD-10-CM | POA: Diagnosis not present

## 2019-11-20 DIAGNOSIS — H25813 Combined forms of age-related cataract, bilateral: Secondary | ICD-10-CM | POA: Diagnosis not present

## 2019-12-16 DIAGNOSIS — Z6824 Body mass index (BMI) 24.0-24.9, adult: Secondary | ICD-10-CM | POA: Diagnosis not present

## 2019-12-16 DIAGNOSIS — I10 Essential (primary) hypertension: Secondary | ICD-10-CM | POA: Diagnosis not present

## 2019-12-16 DIAGNOSIS — E1129 Type 2 diabetes mellitus with other diabetic kidney complication: Secondary | ICD-10-CM | POA: Diagnosis not present

## 2020-01-14 ENCOUNTER — Other Ambulatory Visit: Payer: Self-pay | Admitting: Cardiology

## 2020-01-16 DIAGNOSIS — L84 Corns and callosities: Secondary | ICD-10-CM | POA: Diagnosis not present

## 2020-01-16 DIAGNOSIS — L820 Inflamed seborrheic keratosis: Secondary | ICD-10-CM | POA: Diagnosis not present

## 2020-01-29 ENCOUNTER — Other Ambulatory Visit: Payer: Self-pay | Admitting: Cardiology

## 2020-02-03 DIAGNOSIS — H268 Other specified cataract: Secondary | ICD-10-CM | POA: Diagnosis not present

## 2020-02-05 ENCOUNTER — Telehealth: Payer: Self-pay | Admitting: Cardiology

## 2020-02-05 NOTE — Telephone Encounter (Signed)
LVM for patient to return call to get follow up scheduled with Jordan from recall list 

## 2020-04-09 DIAGNOSIS — E785 Hyperlipidemia, unspecified: Secondary | ICD-10-CM | POA: Diagnosis not present

## 2020-04-09 DIAGNOSIS — R001 Bradycardia, unspecified: Secondary | ICD-10-CM | POA: Diagnosis not present

## 2020-04-09 DIAGNOSIS — I10 Essential (primary) hypertension: Secondary | ICD-10-CM | POA: Diagnosis not present

## 2020-04-09 DIAGNOSIS — E1129 Type 2 diabetes mellitus with other diabetic kidney complication: Secondary | ICD-10-CM | POA: Diagnosis not present

## 2020-04-09 DIAGNOSIS — Z79899 Other long term (current) drug therapy: Secondary | ICD-10-CM | POA: Diagnosis not present

## 2020-04-16 DIAGNOSIS — E1122 Type 2 diabetes mellitus with diabetic chronic kidney disease: Secondary | ICD-10-CM | POA: Diagnosis not present

## 2020-04-16 DIAGNOSIS — I10 Essential (primary) hypertension: Secondary | ICD-10-CM | POA: Diagnosis not present

## 2020-04-16 DIAGNOSIS — E785 Hyperlipidemia, unspecified: Secondary | ICD-10-CM | POA: Diagnosis not present

## 2020-04-16 DIAGNOSIS — Z23 Encounter for immunization: Secondary | ICD-10-CM | POA: Diagnosis not present

## 2020-04-16 DIAGNOSIS — R7309 Other abnormal glucose: Secondary | ICD-10-CM | POA: Diagnosis not present

## 2020-05-09 NOTE — Progress Notes (Signed)
Cardiology Office Note   Date:  05/11/2020   ID:  Shawn Stephenson, DOB Nov 08, 1929, MRN [id number]  PCP:  Asencion Noble, MD  Cardiologist: Elchanan Bob Martinique MD  Chief Complaint  Patient presents with  . Hypertension      History of Present Illness: Shawn Stephenson is a 86 y.o. male who presents for follow up PVCs and hypertensive heart disease.   He has a past history of frequent PVCs and a past history of hypertensive cardiovascular disease and a history of diastolic dysfunction of the left ventricle. He also has hypercholesterolemia. He is a mild diabetic. The patient had a Myoview stress test on 10/01/13 which showed no ischemia and showed a very small apical scar. The study was not gated because of PVCs and PACs. His last echocardiogram was 02/10/14 and showed normal left ventricular systolic function with ejection fraction of 65%. There was aortic valve sclerosis without stenosis. Most recent Echo in June 2019 showed focal basal septal hypertrophy otherwise normal LV.   His  Daughter Tonna Boehringer is a patient of mine and had septal myectomy therapy for hypertrophic obstructive cardiomyopathy done at [facility] clinic.  On follow up today he is doing very well. He denies any chest pain, dyspnea, palpitations or dizziness. He is still inactive other than doing some house work. Reports BP at home is 135/70.   Past Medical History:  Diagnosis Date  . Asymptomatic PVCs   . Diabetes mellitus    well controlled  . Diastolic dysfunction   . Hyperlipidemia   . Hypertension   . Mitral regurgitation    mild    Past Surgical History:  Procedure Laterality Date  . INTRAMEDULLARY (IM) NAIL INTERTROCHANTERIC Right 12/09/2017   Procedure: INTRAMEDULLARY (IM) NAIL INTERTROCHANTRIC;  Surgeon: Rod Can, MD;  Location: [place];  Service: Orthopedics;  Laterality: Right;     Current Outpatient Medications  Medication Sig Dispense Refill  . ACCU-CHEK AVIVA PLUS test strip     .  ACCU-CHEK SOFTCLIX LANCETS lancets     . amLODipine (NORVASC) 10 MG tablet Take 1 tablet (10 mg total) by mouth daily. 90 tablet 3  . aspirin 81 MG chewable tablet Chew 1 tablet (81 mg total) by mouth daily. 60 tablet 1  . atorvastatin (LIPITOR) 10 MG tablet TAKE ONE-HALF TABLET BY MOUTH DAILY 45 tablet 1  . Blood Glucose Monitoring Suppl (ACCU-CHEK AVIVA PLUS) W/DEVICE KIT     . carvedilol (COREG) 6.25 MG tablet Take 1 tablet (6.25 mg total) by mouth 2 (two) times daily with a meal. 90 tablet 1  . Cholecalciferol (VITAMIN D-3) 1000 UNITS CAPS Take 1,000 Units by mouth daily.    . Cyanocobalamin (VITAMIN B 12 PO) Take 1,000 mcg by mouth daily.     . fexofenadine (ALLEGRA) 180 MG tablet Take 180 mg by mouth daily.    Marland Kitchen glipiZIDE (GLUCOTROL) 5 MG 24 hr tablet Take 5 mg by mouth daily.      . hydrochlorothiazide (HYDRODIURIL) 25 MG tablet Take 1 tablet (25 mg total) by mouth daily. (Patient taking differently: Take 12.5 mg by mouth daily. ) 90 tablet 3  . latanoprost (XALATAN) 0.005 % ophthalmic solution Place 1 drop into the right eye daily.    . Lutein-Zeaxanthin 25-5 MG CAPS Take 1 capsule by mouth daily.    Marland Kitchen MAGNESIUM PO Take 400 mg by mouth daily.     Marland Kitchen pyridOXINE (VITAMIN B-6) 50 MG tablet Take 25 mg by mouth daily.     Marland Kitchen  tamsulosin (FLOMAX) 0.4 MG CAPS capsule Take 1 capsule by mouth daily.     No current facility-administered medications for this visit.    Allergies:   Enalapril maleate, Losartan, Menthol, Vasotec, Sulfa antibiotics, and Sulfamethoxazole    Social History:  The patient  reports that he has never smoked. He has never used smokeless tobacco. He reports that he does not drink alcohol and does not use drugs.   Family History:  The patient's family history includes Arthritis in his mother; Diabetes in his father; Hypertension in his brother, father, and mother; Kidney disease in his brother; Stroke in his brother and father.    ROS:  Please see the history of present  illness.   Otherwise, review of systems are positive for none.   All other systems are reviewed and negative.    PHYSICAL EXAM: VS:  BP (!) 150/77   Pulse 60   Temp (!) 96.8 F (36 C)   Ht '5\' 10"'  (1.778 m)   Wt 169 lb 12.8 oz (77 kg)   SpO2 93%   BMI 24.36 kg/m  , BMI Body mass index is 24.36 kg/m. GENERAL:  Well appearing elderly WM in NAD HEENT:  PERRL, EOMI, sclera are clear. Oropharynx is clear. NECK:  No jugular venous distention, carotid upstroke brisk and symmetric, no bruits, no thyromegaly or adenopathy LUNGS:  Clear to auscultation bilaterally CHEST:  Unremarkable HEART:  RRR,  PMI not displaced or sustained,S1 and S2 within normal limits, no S3, no S4: no clicks, no rubs, no murmurs ABD:  Soft, nontender. BS +, no masses or bruits. No hepatomegaly, no splenomegaly EXT:  2 + pulses throughout, no edema, no cyanosis no clubbing SKIN:  Warm and dry.  No rashes NEURO:  Alert and oriented x 3. Cranial nerves II through XII intact. PSYCH:  Cognitively intact   EKG:  EKG is ordered today. Sinus rate 60.  LAFB, RBBB,LVH. No change since last year.  I have personally reviewed and interpreted this study.  Recent Labs: No results found for requested labs within last 8760 hours.    Lipid Panel No results found for: CHOL, TRIG, HDL, CHOLHDL, VLDL, LDLCALC, LDLDIRECT    Wt Readings from Last 3 Encounters:  05/11/20 169 lb 12.8 oz (77 kg)  03/08/19 179 lb 4.8 oz (81.3 kg)  12/10/17 165 lb (74.8 kg)    Labs reviewed from 12/28/15: cholesterol 149, triglycerides 74, LDL 88, HDL 46. Glucose 159, A1c 6.4%. Other chemistries, CBC, UA normal. Dated 02/03/17: cholesterol 134, triglycerides 77, HDL 41. A1c 6.7%. CBC and chemistries normal.  Dated 01/08/18: normal chemistries Dated 02/23/18 normal Hgb Dated 07/06/18 A1c 7.1%.  Dated 04/09/20: A1c 7.4%. cholesterol 146, triglycerides 79, HDL 46, LDL 78. Glucose 188. Potassium 3.4. otherwise CMET and CBC normal.   Echo 12/10/17: Study  Conclusions  - Left ventricle: The cavity size was normal. There was moderate   focal basal hypertrophy of the septum. Systolic function was   vigorous. The estimated ejection fraction was in the range of 65%   to 70%. Wall motion was normal; there were no regional wall   motion abnormalities. Doppler parameters are consistent with   abnormal left ventricular relaxation (grade 1 diastolic   dysfunction). - Aortic valve: Trileaflet; mildly thickened, mildly calcified   leaflets. There was mild regurgitation.  ASSESSMENT AND PLAN:  1. Hypertensive heart disease without heart failure. BP is generally well controlled.  He is asymptomatic. Continue current Rx. I did stress the importance of regular aerobic  activity and maintaining a good weight.   2. Diastolic dysfunction 3. PVCs-no symptoms 4. Aortic valve disease with mild aortic insufficiency  Follow up in one year   Signed, Nelson Noone Martinique MD, [facility]   05/11/2020 4:09 PM    [place]

## 2020-05-11 ENCOUNTER — Ambulatory Visit: Payer: Medicare PPO | Admitting: Cardiology

## 2020-05-11 ENCOUNTER — Encounter: Payer: Self-pay | Admitting: Cardiology

## 2020-05-11 ENCOUNTER — Other Ambulatory Visit: Payer: Self-pay

## 2020-05-11 VITALS — BP 150/77 | HR 60 | Temp 96.8°F | Ht 70.0 in | Wt 169.8 lb

## 2020-05-11 DIAGNOSIS — I119 Hypertensive heart disease without heart failure: Secondary | ICD-10-CM

## 2020-05-11 DIAGNOSIS — I493 Ventricular premature depolarization: Secondary | ICD-10-CM | POA: Diagnosis not present

## 2020-05-11 DIAGNOSIS — I351 Nonrheumatic aortic (valve) insufficiency: Secondary | ICD-10-CM | POA: Diagnosis not present

## 2020-06-03 ENCOUNTER — Other Ambulatory Visit: Payer: Self-pay | Admitting: Cardiology

## 2020-06-03 NOTE — Telephone Encounter (Signed)
Rx has been sent to the pharmacy electronically. ° °

## 2020-06-09 ENCOUNTER — Telehealth: Payer: Self-pay

## 2020-06-09 ENCOUNTER — Other Ambulatory Visit: Payer: Self-pay

## 2020-06-09 DIAGNOSIS — N400 Enlarged prostate without lower urinary tract symptoms: Secondary | ICD-10-CM

## 2020-06-09 MED ORDER — TAMSULOSIN HCL 0.4 MG PO CAPS: 0.4000 mg | ORAL_CAPSULE | Freq: Every day | ORAL | 0 refills | Status: DC

## 2020-06-09 NOTE — Telephone Encounter (Signed)
Spoke with Pharmacist and explained I sent in a 45 day supply only because pt due for yearly follow up. I made appt and attempted to call pt to tell of new appt info but phone was busy. Pharmacy will put note on bag to call us for appt. Scheduled 07/14/20 at 11:00.

## 2020-06-17 NOTE — Telephone Encounter (Signed)
Attempted to call pt. About appt. No way to leave message.

## 2020-07-08 ENCOUNTER — Other Ambulatory Visit: Payer: Self-pay | Admitting: Cardiology

## 2020-07-14 ENCOUNTER — Ambulatory Visit: Payer: Medicare PPO | Admitting: Urology

## 2020-07-14 ENCOUNTER — Encounter: Payer: Self-pay | Admitting: Urology

## 2020-07-14 ENCOUNTER — Ambulatory Visit (INDEPENDENT_AMBULATORY_CARE_PROVIDER_SITE_OTHER): Payer: Medicare PPO | Admitting: Urology

## 2020-07-14 ENCOUNTER — Other Ambulatory Visit: Payer: Self-pay

## 2020-07-14 VITALS — BP 177/74 | HR 75 | Temp 98.4°F | Wt 164.0 lb

## 2020-07-14 DIAGNOSIS — N401 Enlarged prostate with lower urinary tract symptoms: Secondary | ICD-10-CM

## 2020-07-14 DIAGNOSIS — R351 Nocturia: Secondary | ICD-10-CM

## 2020-07-14 DIAGNOSIS — N400 Enlarged prostate without lower urinary tract symptoms: Secondary | ICD-10-CM | POA: Diagnosis not present

## 2020-07-14 LAB — URINALYSIS, ROUTINE W REFLEX MICROSCOPIC
Bilirubin, UA: NEGATIVE
Ketones, UA: NEGATIVE
Leukocytes,UA: NEGATIVE
Nitrite, UA: NEGATIVE
Protein,UA: NEGATIVE
RBC, UA: NEGATIVE
Specific Gravity, UA: 1.015 (ref 1.005–1.030)
Urobilinogen, Ur: 0.2 mg/dL (ref 0.2–1.0)
pH, UA: 7 (ref 5.0–7.5)

## 2020-07-14 NOTE — Progress Notes (Signed)
Urological Symptom Review  Patient is experiencing the following symptoms: Frequent urination Get up at night to urinate Stream starts and stops Erection problems (male only)   Review of Systems  Gastrointestinal (upper)  : Negative for upper GI symptoms  Gastrointestinal (lower) : Negative for lower GI symptoms  Constitutional : Negative for symptoms  Skin: Negative for skin symptoms  Eyes: Negative for eye symptoms  Ear/Nose/Throat : Negative for Ear/Nose/Throat symptoms  Hematologic/Lymphatic: Negative for Hematologic/Lymphatic symptoms  Cardiovascular : Negative for cardiovascular symptoms  Respiratory : Negative for respiratory symptoms  Endocrine: Excessive thirst  Musculoskeletal: Back pain  Neurological: Negative for neurological symptoms  Psychologic: Negative for psychiatric symptoms

## 2020-07-14 NOTE — Progress Notes (Signed)
Patient had to leave for an appt at 12pm-   Pt will return this afternoon to be seen

## 2020-07-14 NOTE — Progress Notes (Signed)
History of Present Illness: He is here today for follow-up of voiding issues.  He is still on tamsulosin.  Daytime urinary symptoms are not terrible-his main issue is nocturia x3.  When he does void during the day he has a good stream and what he considers normal volumes.  At night, his stream is slower but he does void much smaller volumes.  He does not have significant side effects from the tamsulosin.  He does have peripheral edema, he is not currently wearing compression socks.  Past Medical History:  Diagnosis Date  . Asymptomatic PVCs   . Diabetes mellitus    well controlled  . Diastolic dysfunction   . Hyperlipidemia   . Hypertension   . Mitral regurgitation    mild    Past Surgical History:  Procedure Laterality Date  . INTRAMEDULLARY (IM) NAIL INTERTROCHANTERIC Right 12/09/2017   Procedure: INTRAMEDULLARY (IM) NAIL INTERTROCHANTRIC;  Surgeon: Rod Can, MD;  Location: [facility];  Service: Orthopedics;  Laterality: Right;    Home Medications:  Allergies as of 07/14/2020      Reactions   Enalapril Maleate Other (See Comments)   swelling   Losartan Other (See Comments)   Makes him feel jittery   Menthol Itching   Vasotec Swelling   Sulfa Antibiotics Other (See Comments)   Doesn't recall reaction   Sulfamethoxazole Other (See Comments)   Doesn't recall reaction      Medication List       Accurate as of July 14, 2020  3:45 PM. If you have any questions, ask your nurse or doctor.        Accu-Chek Aviva Plus test strip Generic drug: glucose blood   Accu-Chek Aviva Plus w/Device Kit   Accu-Chek Softclix Lancets lancets   amLODipine 10 MG tablet Commonly known as: NORVASC Take 1 tablet (10 mg total) by mouth daily.   aspirin 81 MG chewable tablet Chew 1 tablet (81 mg total) by mouth daily.   atorvastatin 10 MG tablet Commonly known as: LIPITOR TAKE ONE-HALF TABLET BY MOUTH DAILY   carvedilol 6.25 MG tablet Commonly known as: COREG TAKE ONE TABLET BY  MOUTH TWICE A DAY WITH A MEAL   fexofenadine 180 MG tablet Commonly known as: ALLEGRA Take 180 mg by mouth daily.   glipiZIDE 5 MG 24 hr tablet Commonly known as: GLUCOTROL XL Take 5 mg by mouth daily.   hydrochlorothiazide 25 MG tablet Commonly known as: HYDRODIURIL Take 1 tablet (25 mg total) by mouth daily. What changed: how much to take   latanoprost 0.005 % ophthalmic solution Commonly known as: XALATAN Place 1 drop into the right eye daily.   Lutein-Zeaxanthin 25-5 MG Caps Take 1 capsule by mouth daily.   MAGNESIUM PO Take 400 mg by mouth daily.   pyridOXINE 50 MG tablet Commonly known as: VITAMIN B-6 Take 25 mg by mouth daily.   tamsulosin 0.4 MG Caps capsule Commonly known as: FLOMAX Take 1 capsule (0.4 mg total) by mouth daily.   VITAMIN B 12 PO Take 1,000 mcg by mouth daily.   Vitamin D-3 25 MCG (1000 UT) Caps Take 1,000 Units by mouth daily.       Allergies:  Allergies  Allergen Reactions  . Enalapril Maleate Other (See Comments)    swelling  . Losartan Other (See Comments)    Makes him feel jittery  . Menthol Itching  . Vasotec Swelling  . Sulfa Antibiotics Other (See Comments)    Doesn't recall reaction  . Sulfamethoxazole Other (See  Comments)    Doesn't recall reaction    Family History  Problem Relation Age of Onset  . Stroke Father   . Diabetes Father   . Hypertension Father   . Hypertension Mother   . Arthritis Mother   . Hypertension Brother   . Stroke Brother   . Kidney disease Brother     Social History:  reports that he has never smoked. He has never used smokeless tobacco. He reports that he does not drink alcohol and does not use drugs.  ROS: A complete review of systems was performed.  All systems are negative except for pertinent findings as noted.  Physical Exam:  Vital signs in last 24 hours: There were no vitals taken for this visit. Constitutional:  Alert and oriented, No acute distress Cardiovascular: Regular  rate  Respiratory: Normal respiratory effort GI: Abdomen is soft, nontender, nondistended, no abdominal masses. No CVAT.  Small left inguinal hernia. Genitourinary: Normal male phallus, testes are slightly atrophic, descended bilaterally and non-tender and without masses, scrotum is normal in appearance without lesions or masses, perineum is normal on inspection.  Prostate 50 g in size. Lymphatic: No lymphadenopathy Neurologic: Grossly intact, no focal deficits Psychiatric: Normal mood and affect  Laboratory Data:  No results for input(s): WBC, HGB, HCT, PLT in the last 72 hours.  No results for input(s): NA, K, CL, GLUCOSE, BUN, CALCIUM, CREATININE in the last 72 hours.  Invalid input(s): CO3   Results for orders placed or performed in visit on 07/14/20 (from the past 24 hour(s))  Urinalysis, Routine w reflex microscopic     Status: Abnormal   Collection Time: 07/14/20 11:34 AM  Result Value Ref Range   Specific Gravity, UA 1.015 1.005 - 1.030   pH, UA 7.0 5.0 - 7.5   Color, UA Yellow Yellow   Appearance Ur Clear Clear   Leukocytes,UA Negative Negative   Protein,UA Negative Negative/Trace   Glucose, UA 2+ (A) Negative   Ketones, UA Negative Negative   RBC, UA Negative Negative   Bilirubin, UA Negative Negative   Urobilinogen, Ur 0.2 0.2 - 1.0 mg/dL   Nitrite, UA Negative Negative   Microscopic Examination Comment    Narrative   Performed at:  [facility] [address], [place], [place]  [id number] Lab Director: Mina Marble MT, Phone:  [phone]   I have reviewed prior pt notes  I have reviewed urinalysis result  I have reviewed prior PSA results    Impression/Assessment:  Mild BPH symptoms but significant, persistent nocturia.  I do not necessarily think that this is obstructive in nature  Plan:  1.  I again discussed with the patient causative factors of nocturia-sleep issues, peripheral edema as well as lower urinary tract symptoms  2.  He  will limit afternoon and evening fluids, where his compression socks during the day, and I would have the patient asked Dr. Willey Blade if perhaps increasing his hydrochlorothiazide to a full dose in the morning might help  3.  I will have him come back in about 3 months to recheck his symptoms

## 2020-07-16 ENCOUNTER — Telehealth: Payer: Self-pay

## 2020-07-16 NOTE — Telephone Encounter (Signed)
Pt seen in office

## 2020-07-16 NOTE — Telephone Encounter (Signed)
Pt seen in office 07/14/20.

## 2020-07-20 NOTE — Telephone Encounter (Signed)
Pt recently seen in office.  

## 2020-08-12 DIAGNOSIS — E1129 Type 2 diabetes mellitus with other diabetic kidney complication: Secondary | ICD-10-CM | POA: Diagnosis not present

## 2020-08-12 DIAGNOSIS — I10 Essential (primary) hypertension: Secondary | ICD-10-CM | POA: Diagnosis not present

## 2020-08-12 DIAGNOSIS — R001 Bradycardia, unspecified: Secondary | ICD-10-CM | POA: Diagnosis not present

## 2020-08-12 DIAGNOSIS — E785 Hyperlipidemia, unspecified: Secondary | ICD-10-CM | POA: Diagnosis not present

## 2020-08-17 DIAGNOSIS — N401 Enlarged prostate with lower urinary tract symptoms: Secondary | ICD-10-CM | POA: Diagnosis not present

## 2020-08-17 DIAGNOSIS — I10 Essential (primary) hypertension: Secondary | ICD-10-CM | POA: Diagnosis not present

## 2020-08-17 DIAGNOSIS — R7309 Other abnormal glucose: Secondary | ICD-10-CM | POA: Diagnosis not present

## 2020-08-17 DIAGNOSIS — E1122 Type 2 diabetes mellitus with diabetic chronic kidney disease: Secondary | ICD-10-CM | POA: Diagnosis not present

## 2020-08-24 DIAGNOSIS — H401411 Capsular glaucoma with pseudoexfoliation of lens, right eye, mild stage: Secondary | ICD-10-CM | POA: Diagnosis not present

## 2020-09-03 ENCOUNTER — Telehealth: Payer: Self-pay | Admitting: Urology

## 2020-09-03 ENCOUNTER — Other Ambulatory Visit: Payer: Self-pay

## 2020-09-03 DIAGNOSIS — N400 Enlarged prostate without lower urinary tract symptoms: Secondary | ICD-10-CM

## 2020-09-03 MED ORDER — TAMSULOSIN HCL 0.4 MG PO CAPS: 0.4000 mg | ORAL_CAPSULE | Freq: Every day | ORAL | 0 refills | Status: DC

## 2020-09-03 NOTE — Telephone Encounter (Signed)
Rx refilled. Patient called with a busy line with several attempts.

## 2020-09-03 NOTE — Telephone Encounter (Signed)
Pt called about getting a refill on his tamsulosin. He said that the pharmacy will not refill it and they said he needed to make an appt with [facility]. The patient stated that he was here in January and he also has a f/u visit in April. Please call when you can. Thanks.

## 2020-10-13 ENCOUNTER — Ambulatory Visit (INDEPENDENT_AMBULATORY_CARE_PROVIDER_SITE_OTHER): Payer: Medicare PPO | Admitting: Urology

## 2020-10-13 ENCOUNTER — Other Ambulatory Visit: Payer: Self-pay

## 2020-10-13 ENCOUNTER — Encounter: Payer: Self-pay | Admitting: Urology

## 2020-10-13 VITALS — BP 157/67 | HR 70 | Temp 98.6°F | Ht 70.0 in | Wt 164.0 lb

## 2020-10-13 DIAGNOSIS — N481 Balanitis: Secondary | ICD-10-CM | POA: Diagnosis not present

## 2020-10-13 DIAGNOSIS — R351 Nocturia: Secondary | ICD-10-CM | POA: Diagnosis not present

## 2020-10-13 DIAGNOSIS — N401 Enlarged prostate with lower urinary tract symptoms: Secondary | ICD-10-CM

## 2020-10-13 LAB — BLADDER SCAN AMB NON-IMAGING: Scan Result: 26

## 2020-10-13 MED ORDER — CLOTRIMAZOLE-BETAMETHASONE 1-0.05 % EX CREA: TOPICAL_CREAM | CUTANEOUS | 3 refills | Status: DC

## 2020-10-13 NOTE — Progress Notes (Signed)
post void residual= 26  Urological Symptom Review  Patient is experiencing the following symptoms: Frequent urination Get up at night to urinate Stream starts and stops Trouble starting stream Erection problems (male only)   Review of Systems  Gastrointestinal (upper)  : Negative for upper GI symptoms  Gastrointestinal (lower) : Negative for lower GI symptoms  Constitutional : Negative for symptoms  Skin: Skin rash/lesion  Eyes: Negative for eye symptoms  Ear/Nose/Throat : Negative for Ear/Nose/Throat symptoms  Hematologic/Lymphatic: Negative for Hematologic/Lymphatic symptoms  Cardiovascular : Negative for cardiovascular symptoms  Respiratory : Negative for respiratory symptoms  Endocrine: Negative for endocrine symptoms  Musculoskeletal: Back pain Joint pain  Neurological: Negative for neurological symptoms  Psychologic: Negative for psychiatric symptoms

## 2020-10-13 NOTE — Progress Notes (Signed)
History of Present Illness: This man presents today for follow-up of BPH symptoms as well as nocturia.  It has been felt that some of his nocturia may well be due to internal polyuria i.e. mobilization of peripheral edema at night.  At his last visit it was recommended that he use his HCTZ to 25 mg a day, limit the sodium and wear compression socks.  4.26.2022: Since his last visit, he still has urinary frequency.  He gave the a voiding diary.  Average void is becoming 3 and 4 ounces.  He does have frequency because of this.  His nighttime voided volumes are about the same as his daytime although his first morning void is up to 6 ounces.  He is still on tamsulosin.    Past Medical History:  Diagnosis Date  . Asymptomatic PVCs   . Diabetes mellitus    well controlled  . Diastolic dysfunction   . Hyperlipidemia   . Hypertension   . Mitral regurgitation    mild    Past Surgical History:  Procedure Laterality Date  . INTRAMEDULLARY (IM) NAIL INTERTROCHANTERIC Right 12/09/2017   Procedure: INTRAMEDULLARY (IM) NAIL INTERTROCHANTRIC;  Surgeon: Rod Can, MD;  Location: [facility];  Service: Orthopedics;  Laterality: Right;    Home Medications:  Allergies as of 10/13/2020      Reactions   Enalapril Maleate Other (See Comments)   swelling   Losartan Other (See Comments)   Makes him feel jittery   Menthol Itching   Vasotec Swelling   Sulfa Antibiotics Other (See Comments)   Doesn't recall reaction   Sulfamethoxazole Other (See Comments)   Doesn't recall reaction      Medication List       Accurate as of October 13, 2020  9:04 AM. If you have any questions, ask your nurse or doctor.        Accu-Chek Aviva Plus test strip Generic drug: glucose blood   Accu-Chek Aviva Plus w/Device Kit   Accu-Chek Softclix Lancets lancets   amLODipine 10 MG tablet Commonly known as: NORVASC Take 1 tablet (10 mg total) by mouth daily.   aspirin 81 MG chewable tablet Chew 1 tablet (81 mg  total) by mouth daily.   atorvastatin 10 MG tablet Commonly known as: LIPITOR TAKE ONE-HALF TABLET BY MOUTH DAILY   carvedilol 6.25 MG tablet Commonly known as: COREG TAKE ONE TABLET BY MOUTH TWICE A DAY WITH A MEAL   fexofenadine 180 MG tablet Commonly known as: ALLEGRA Take 180 mg by mouth daily.   glipiZIDE 5 MG 24 hr tablet Commonly known as: GLUCOTROL XL Take 5 mg by mouth daily.   hydrochlorothiazide 25 MG tablet Commonly known as: HYDRODIURIL Take 1 tablet (25 mg total) by mouth daily. What changed: how much to take   latanoprost 0.005 % ophthalmic solution Commonly known as: XALATAN Place 1 drop into the right eye daily.   Lutein-Zeaxanthin 25-5 MG Caps Take 1 capsule by mouth daily.   MAGNESIUM PO Take 400 mg by mouth daily.   pyridOXINE 50 MG tablet Commonly known as: VITAMIN B-6 Take 25 mg by mouth daily.   tamsulosin 0.4 MG Caps capsule Commonly known as: FLOMAX Take 1 capsule (0.4 mg total) by mouth daily.   VITAMIN B 12 PO Take 1,000 mcg by mouth daily.   Vitamin D-3 25 MCG (1000 UT) Caps Take 1,000 Units by mouth daily.       Allergies:  Allergies  Allergen Reactions  . Enalapril Maleate Other (See Comments)  swelling  . Losartan Other (See Comments)    Makes him feel jittery  . Menthol Itching  . Vasotec Swelling  . Sulfa Antibiotics Other (See Comments)    Doesn't recall reaction  . Sulfamethoxazole Other (See Comments)    Doesn't recall reaction    Family History  Problem Relation Age of Onset  . Stroke Father   . Diabetes Father   . Hypertension Father   . Hypertension Mother   . Arthritis Mother   . Hypertension Brother   . Stroke Brother   . Kidney disease Brother     Social History:  reports that he has never smoked. He has never used smokeless tobacco. He reports that he does not drink alcohol and does not use drugs.  ROS: A complete review of systems was performed.  All systems are negative except for pertinent  findings as noted.  Physical Exam:  Vital signs in last 24 hours: There were no vitals taken for this visit. Constitutional:  Alert and oriented, No acute distress Cardiovascular: Regular rate  Respiratory: Normal respiratory effort Lymphatic: No lymphadenopathy Neurologic: Grossly intact, no focal deficits Psychiatric: Normal mood and affect  I have reviewed prior pt notes  I have reviewed notes from referring/previous physicians  I have reviewed urinalysis results  I have independently reviewed prior imaging-- bladder scan today revealed 130 mL    Impression/Assessment:  Lower urinary tract symptoms-probably from overactive bladder.  He has frequency, urgency and low residual volume.  Plan:  1.  I discussed with overactive bladder guide sheet/bladder drills, overactive bladder meds or combination of  2.  He would like to go with behavioral therapy alone-I gave him an OAB sheet, I will see him back in 2 mos  3. lotrisone prescibed for fungal infection

## 2020-10-23 ENCOUNTER — Telehealth: Payer: Self-pay | Admitting: Urology

## 2020-10-23 ENCOUNTER — Other Ambulatory Visit: Payer: Self-pay

## 2020-10-23 DIAGNOSIS — N400 Enlarged prostate without lower urinary tract symptoms: Secondary | ICD-10-CM

## 2020-10-23 MED ORDER — TAMSULOSIN HCL 0.4 MG PO CAPS: 0.4000 mg | ORAL_CAPSULE | Freq: Every day | ORAL | 0 refills | Status: DC

## 2020-10-23 MED ORDER — TAMSULOSIN HCL 0.4 MG PO CAPS: 0.4000 mg | ORAL_CAPSULE | Freq: Every day | ORAL | 3 refills | Status: DC

## 2020-10-23 NOTE — Telephone Encounter (Signed)
Refilled flomax. Patient called and made aware.

## 2020-10-23 NOTE — Telephone Encounter (Signed)
Pt called needing to get a refill on his medication. He did not leave the name of it in his message. Please call pt when you can. Thanks

## 2020-10-29 DIAGNOSIS — H353132 Nonexudative age-related macular degeneration, bilateral, intermediate dry stage: Secondary | ICD-10-CM | POA: Diagnosis not present

## 2020-11-10 LAB — URINALYSIS, MICROSCOPIC ONLY
Bacteria, UA: NONE SEEN
Epithelial Cells (non renal): NONE SEEN /hpf (ref 0–10)
RBC: NONE SEEN /hpf (ref 0–2)
Renal Epithel, UA: NONE SEEN /hpf
WBC, UA: NEGATIVE /hpf (ref 0–5)

## 2020-11-25 DIAGNOSIS — H353132 Nonexudative age-related macular degeneration, bilateral, intermediate dry stage: Secondary | ICD-10-CM | POA: Diagnosis not present

## 2020-11-25 DIAGNOSIS — H25813 Combined forms of age-related cataract, bilateral: Secondary | ICD-10-CM | POA: Diagnosis not present

## 2020-11-25 DIAGNOSIS — E119 Type 2 diabetes mellitus without complications: Secondary | ICD-10-CM | POA: Diagnosis not present

## 2020-11-28 ENCOUNTER — Other Ambulatory Visit: Payer: Self-pay | Admitting: Cardiology

## 2020-12-22 DIAGNOSIS — E1129 Type 2 diabetes mellitus with other diabetic kidney complication: Secondary | ICD-10-CM | POA: Diagnosis not present

## 2020-12-27 NOTE — Progress Notes (Signed)
History of Present Illness: Here for follow-up of lower urinary tract symptoms-frequency, urgency, nocturia.  At his last visit, he was given overactive bladder guidelines.  Unfortunately, he feels like he has dropped the ball and not really done too much for behavioral modification and bladder retraining.  Past Medical History:  Diagnosis Date   Asymptomatic PVCs    Diabetes mellitus    well controlled   Diastolic dysfunction    Hyperlipidemia    Hypertension    Mitral regurgitation    mild    Past Surgical History:  Procedure Laterality Date   INTRAMEDULLARY (IM) NAIL INTERTROCHANTERIC Right 12/09/2017   Procedure: INTRAMEDULLARY (IM) NAIL INTERTROCHANTRIC;  Surgeon: Rod Can, MD;  Location: [facility];  Service: Orthopedics;  Laterality: Right;    Home Medications:  Allergies as of 12/29/2020       Reactions   Enalapril Maleate Other (See Comments)   swelling   Losartan Other (See Comments)   Makes him feel jittery   Menthol Itching   Vasotec Swelling   Sulfa Antibiotics Other (See Comments)   Doesn't recall reaction   Sulfamethoxazole Other (See Comments)   Doesn't recall reaction        Medication List        Accurate as of December 27, 2020  8:50 PM. If you have any questions, ask your nurse or doctor.          Accu-Chek Aviva Plus test strip Generic drug: glucose blood   Accu-Chek Aviva Plus w/Device Kit   Accu-Chek Softclix Lancets lancets   amLODipine 10 MG tablet Commonly known as: NORVASC Take 1 tablet (10 mg total) by mouth daily.   aspirin 81 MG chewable tablet Chew 1 tablet (81 mg total) by mouth daily.   atorvastatin 10 MG tablet Commonly known as: LIPITOR TAKE ONE-HALF TABLET BY MOUTH DAILY   carvedilol 6.25 MG tablet Commonly known as: COREG TAKE ONE TABLET BY MOUTH TWICE A DAY WITH A MEAL   clotrimazole-betamethasone cream Commonly known as: Lotrisone Apply to affected area 2 times daily   fexofenadine 180 MG tablet Commonly  known as: ALLEGRA Take 180 mg by mouth daily.   glipiZIDE 5 MG 24 hr tablet Commonly known as: GLUCOTROL XL Take 5 mg by mouth daily.   hydrochlorothiazide 25 MG tablet Commonly known as: HYDRODIURIL Take 1 tablet (25 mg total) by mouth daily. What changed: how much to take   latanoprost 0.005 % ophthalmic solution Commonly known as: XALATAN Place 1 drop into the right eye daily.   Lutein-Zeaxanthin 25-5 MG Caps Take 1 capsule by mouth daily.   MAGNESIUM PO Take 400 mg by mouth daily.   pyridOXINE 50 MG tablet Commonly known as: VITAMIN B-6 Take 25 mg by mouth daily.   tamsulosin 0.4 MG Caps capsule Commonly known as: FLOMAX Take 1 capsule (0.4 mg total) by mouth daily.   VITAMIN B 12 PO Take 1,000 mcg by mouth daily.   Vitamin D-3 25 MCG (1000 UT) Caps Take 1,000 Units by mouth daily.        Allergies:  Allergies  Allergen Reactions   Enalapril Maleate Other (See Comments)    swelling   Losartan Other (See Comments)    Makes him feel jittery   Menthol Itching   Vasotec Swelling   Sulfa Antibiotics Other (See Comments)    Doesn't recall reaction   Sulfamethoxazole Other (See Comments)    Doesn't recall reaction    Family History  Problem Relation Age of Onset  Stroke Father    Diabetes Father    Hypertension Father    Hypertension Mother    Arthritis Mother    Hypertension Brother    Stroke Brother    Kidney disease Brother     Social History:  reports that he has never smoked. He has never used smokeless tobacco. He reports that he does not drink alcohol and does not use drugs.  ROS: A complete review of systems was performed.  All systems are negative except for pertinent findings as noted.  Physical Exam:  Vital signs in last 24 hours: There were no vitals taken for this visit. Constitutional:  Alert and oriented, No acute distress Cardiovascular: Regular rate  Respiratory: Normal respiratory effort Lymphatic: No  lymphadenopathy Neurologic: Grossly intact, no focal deficits Psychiatric: Normal mood and affect  I have reviewed prior pt notes  I have reviewed notes from referring/previous physicians  I have reviewed urinalysis results      Impression/Assessment:  Overactive bladder, nocturia  Plan:  I once again discussed behavioral modification and bladder retraining  I gave him 6 weeks of Myrbetriq 50 mg to take  He will call after 6 weeks.  If he is doing well with this, we will attempt to send that in or a generic substitute  I will see him back in about 4 months.

## 2020-12-29 ENCOUNTER — Encounter: Payer: Self-pay | Admitting: Urology

## 2020-12-29 ENCOUNTER — Other Ambulatory Visit: Payer: Self-pay

## 2020-12-29 ENCOUNTER — Ambulatory Visit: Payer: Medicare PPO | Admitting: Urology

## 2020-12-29 VITALS — BP 187/74 | HR 76 | Temp 98.6°F | Wt 157.2 lb

## 2020-12-29 DIAGNOSIS — R350 Frequency of micturition: Secondary | ICD-10-CM

## 2020-12-29 DIAGNOSIS — R7309 Other abnormal glucose: Secondary | ICD-10-CM | POA: Diagnosis not present

## 2020-12-29 DIAGNOSIS — R351 Nocturia: Secondary | ICD-10-CM | POA: Diagnosis not present

## 2020-12-29 DIAGNOSIS — I10 Essential (primary) hypertension: Secondary | ICD-10-CM | POA: Diagnosis not present

## 2020-12-29 DIAGNOSIS — N400 Enlarged prostate without lower urinary tract symptoms: Secondary | ICD-10-CM | POA: Diagnosis not present

## 2020-12-29 DIAGNOSIS — E1122 Type 2 diabetes mellitus with diabetic chronic kidney disease: Secondary | ICD-10-CM | POA: Diagnosis not present

## 2020-12-29 LAB — URINALYSIS, ROUTINE W REFLEX MICROSCOPIC
Bilirubin, UA: NEGATIVE
Glucose, UA: NEGATIVE
Ketones, UA: NEGATIVE
Leukocytes,UA: NEGATIVE
Nitrite, UA: NEGATIVE
Protein,UA: NEGATIVE
RBC, UA: NEGATIVE
Specific Gravity, UA: 1.01 (ref 1.005–1.030)
Urobilinogen, Ur: 0.2 mg/dL (ref 0.2–1.0)
pH, UA: 7 (ref 5.0–7.5)

## 2020-12-29 NOTE — Progress Notes (Signed)
Urological Symptom Review  Patient is experiencing the following symptoms: Frequent urination Get up at night to urinate Stream starts and stops Trouble starting stream   Review of Systems  Gastrointestinal (upper)  : Negative for upper GI symptoms  Gastrointestinal (lower) : Negative for lower GI symptoms  Constitutional : Negative for symptoms  Skin: Skin rash/lesion  Eyes: Negative for eye symptoms  Ear/Nose/Throat : Negative for Ear/Nose/Throat symptoms  Hematologic/Lymphatic: Negative for Hematologic/Lymphatic symptoms  Cardiovascular : Negative for cardiovascular symptoms  Respiratory : Negative for respiratory symptoms  Endocrine: Negative for endocrine symptoms  Musculoskeletal: Negative for musculoskeletal symptoms Back pain  Neurological: Negative for neurological symptoms  Psychologic: Negative for psychiatric symptoms

## 2021-01-13 ENCOUNTER — Other Ambulatory Visit: Payer: Self-pay | Admitting: Cardiology

## 2021-01-15 ENCOUNTER — Telehealth: Payer: Self-pay | Admitting: Cardiology

## 2021-01-15 MED ORDER — ATORVASTATIN CALCIUM 10 MG PO TABS: ORAL_TABLET | ORAL | 0 refills | Status: DC

## 2021-01-15 NOTE — Telephone Encounter (Signed)
Pt c/o medication issue:  1. Name of Medication: atorvastatin (LIPITOR) 10 MG tablet  2. How are you currently taking this medication (dosage and times per day)? Need clarification  3. Are you having a reaction (difficulty breathing--STAT)? no  4. What is your medication issue? [facility] calling to clarify if the prescription received is correct. They state the patient has been taking half a tablet daily and they called in 1 tablet daily. Phone: [phone]

## 2021-01-15 NOTE — Telephone Encounter (Signed)
Spoke with pharmacy and patient current on getting his Atorvastatin and fills at 1/2 tablet daily on time  Refill for 1/2 tablet daily sent to pharmacy

## 2021-01-18 DIAGNOSIS — M5116 Intervertebral disc disorders with radiculopathy, lumbar region: Secondary | ICD-10-CM | POA: Diagnosis not present

## 2021-01-18 DIAGNOSIS — M9905 Segmental and somatic dysfunction of pelvic region: Secondary | ICD-10-CM | POA: Diagnosis not present

## 2021-01-18 DIAGNOSIS — M25551 Pain in right hip: Secondary | ICD-10-CM | POA: Diagnosis not present

## 2021-01-18 DIAGNOSIS — M9903 Segmental and somatic dysfunction of lumbar region: Secondary | ICD-10-CM | POA: Diagnosis not present

## 2021-01-19 DIAGNOSIS — M25551 Pain in right hip: Secondary | ICD-10-CM | POA: Diagnosis not present

## 2021-01-19 DIAGNOSIS — M9905 Segmental and somatic dysfunction of pelvic region: Secondary | ICD-10-CM | POA: Diagnosis not present

## 2021-01-19 DIAGNOSIS — M5116 Intervertebral disc disorders with radiculopathy, lumbar region: Secondary | ICD-10-CM | POA: Diagnosis not present

## 2021-01-19 DIAGNOSIS — M9903 Segmental and somatic dysfunction of lumbar region: Secondary | ICD-10-CM | POA: Diagnosis not present

## 2021-01-21 DIAGNOSIS — M9905 Segmental and somatic dysfunction of pelvic region: Secondary | ICD-10-CM | POA: Diagnosis not present

## 2021-01-21 DIAGNOSIS — M5116 Intervertebral disc disorders with radiculopathy, lumbar region: Secondary | ICD-10-CM | POA: Diagnosis not present

## 2021-01-21 DIAGNOSIS — M25551 Pain in right hip: Secondary | ICD-10-CM | POA: Diagnosis not present

## 2021-01-21 DIAGNOSIS — M9903 Segmental and somatic dysfunction of lumbar region: Secondary | ICD-10-CM | POA: Diagnosis not present

## 2021-01-25 DIAGNOSIS — M9903 Segmental and somatic dysfunction of lumbar region: Secondary | ICD-10-CM | POA: Diagnosis not present

## 2021-01-25 DIAGNOSIS — M5116 Intervertebral disc disorders with radiculopathy, lumbar region: Secondary | ICD-10-CM | POA: Diagnosis not present

## 2021-01-25 DIAGNOSIS — M9905 Segmental and somatic dysfunction of pelvic region: Secondary | ICD-10-CM | POA: Diagnosis not present

## 2021-01-25 DIAGNOSIS — M25551 Pain in right hip: Secondary | ICD-10-CM | POA: Diagnosis not present

## 2021-01-26 DIAGNOSIS — M5116 Intervertebral disc disorders with radiculopathy, lumbar region: Secondary | ICD-10-CM | POA: Diagnosis not present

## 2021-01-26 DIAGNOSIS — M25551 Pain in right hip: Secondary | ICD-10-CM | POA: Diagnosis not present

## 2021-01-26 DIAGNOSIS — M9905 Segmental and somatic dysfunction of pelvic region: Secondary | ICD-10-CM | POA: Diagnosis not present

## 2021-01-26 DIAGNOSIS — M9903 Segmental and somatic dysfunction of lumbar region: Secondary | ICD-10-CM | POA: Diagnosis not present

## 2021-01-27 DIAGNOSIS — M9905 Segmental and somatic dysfunction of pelvic region: Secondary | ICD-10-CM | POA: Diagnosis not present

## 2021-01-27 DIAGNOSIS — M9903 Segmental and somatic dysfunction of lumbar region: Secondary | ICD-10-CM | POA: Diagnosis not present

## 2021-01-27 DIAGNOSIS — M25551 Pain in right hip: Secondary | ICD-10-CM | POA: Diagnosis not present

## 2021-01-27 DIAGNOSIS — M5116 Intervertebral disc disorders with radiculopathy, lumbar region: Secondary | ICD-10-CM | POA: Diagnosis not present

## 2021-02-01 DIAGNOSIS — M9903 Segmental and somatic dysfunction of lumbar region: Secondary | ICD-10-CM | POA: Diagnosis not present

## 2021-02-01 DIAGNOSIS — M5116 Intervertebral disc disorders with radiculopathy, lumbar region: Secondary | ICD-10-CM | POA: Diagnosis not present

## 2021-02-01 DIAGNOSIS — M25551 Pain in right hip: Secondary | ICD-10-CM | POA: Diagnosis not present

## 2021-02-01 DIAGNOSIS — M9905 Segmental and somatic dysfunction of pelvic region: Secondary | ICD-10-CM | POA: Diagnosis not present

## 2021-02-02 DIAGNOSIS — M25551 Pain in right hip: Secondary | ICD-10-CM | POA: Diagnosis not present

## 2021-02-02 DIAGNOSIS — M9903 Segmental and somatic dysfunction of lumbar region: Secondary | ICD-10-CM | POA: Diagnosis not present

## 2021-02-02 DIAGNOSIS — M5116 Intervertebral disc disorders with radiculopathy, lumbar region: Secondary | ICD-10-CM | POA: Diagnosis not present

## 2021-02-02 DIAGNOSIS — M9905 Segmental and somatic dysfunction of pelvic region: Secondary | ICD-10-CM | POA: Diagnosis not present

## 2021-02-03 DIAGNOSIS — M9903 Segmental and somatic dysfunction of lumbar region: Secondary | ICD-10-CM | POA: Diagnosis not present

## 2021-02-03 DIAGNOSIS — M9905 Segmental and somatic dysfunction of pelvic region: Secondary | ICD-10-CM | POA: Diagnosis not present

## 2021-02-03 DIAGNOSIS — M25551 Pain in right hip: Secondary | ICD-10-CM | POA: Diagnosis not present

## 2021-02-03 DIAGNOSIS — M5116 Intervertebral disc disorders with radiculopathy, lumbar region: Secondary | ICD-10-CM | POA: Diagnosis not present

## 2021-02-08 DIAGNOSIS — M9905 Segmental and somatic dysfunction of pelvic region: Secondary | ICD-10-CM | POA: Diagnosis not present

## 2021-02-08 DIAGNOSIS — M9903 Segmental and somatic dysfunction of lumbar region: Secondary | ICD-10-CM | POA: Diagnosis not present

## 2021-02-08 DIAGNOSIS — M5116 Intervertebral disc disorders with radiculopathy, lumbar region: Secondary | ICD-10-CM | POA: Diagnosis not present

## 2021-02-08 DIAGNOSIS — M25551 Pain in right hip: Secondary | ICD-10-CM | POA: Diagnosis not present

## 2021-02-10 DIAGNOSIS — M9905 Segmental and somatic dysfunction of pelvic region: Secondary | ICD-10-CM | POA: Diagnosis not present

## 2021-02-10 DIAGNOSIS — M25551 Pain in right hip: Secondary | ICD-10-CM | POA: Diagnosis not present

## 2021-02-10 DIAGNOSIS — M9903 Segmental and somatic dysfunction of lumbar region: Secondary | ICD-10-CM | POA: Diagnosis not present

## 2021-02-10 DIAGNOSIS — M5116 Intervertebral disc disorders with radiculopathy, lumbar region: Secondary | ICD-10-CM | POA: Diagnosis not present

## 2021-02-15 DIAGNOSIS — M9903 Segmental and somatic dysfunction of lumbar region: Secondary | ICD-10-CM | POA: Diagnosis not present

## 2021-02-15 DIAGNOSIS — M25551 Pain in right hip: Secondary | ICD-10-CM | POA: Diagnosis not present

## 2021-02-15 DIAGNOSIS — M9905 Segmental and somatic dysfunction of pelvic region: Secondary | ICD-10-CM | POA: Diagnosis not present

## 2021-02-15 DIAGNOSIS — M5116 Intervertebral disc disorders with radiculopathy, lumbar region: Secondary | ICD-10-CM | POA: Diagnosis not present

## 2021-02-17 DIAGNOSIS — M25551 Pain in right hip: Secondary | ICD-10-CM | POA: Diagnosis not present

## 2021-02-17 DIAGNOSIS — M9903 Segmental and somatic dysfunction of lumbar region: Secondary | ICD-10-CM | POA: Diagnosis not present

## 2021-02-17 DIAGNOSIS — M9905 Segmental and somatic dysfunction of pelvic region: Secondary | ICD-10-CM | POA: Diagnosis not present

## 2021-02-17 DIAGNOSIS — M5116 Intervertebral disc disorders with radiculopathy, lumbar region: Secondary | ICD-10-CM | POA: Diagnosis not present

## 2021-02-23 DIAGNOSIS — M9905 Segmental and somatic dysfunction of pelvic region: Secondary | ICD-10-CM | POA: Diagnosis not present

## 2021-02-23 DIAGNOSIS — M9903 Segmental and somatic dysfunction of lumbar region: Secondary | ICD-10-CM | POA: Diagnosis not present

## 2021-02-23 DIAGNOSIS — M5116 Intervertebral disc disorders with radiculopathy, lumbar region: Secondary | ICD-10-CM | POA: Diagnosis not present

## 2021-02-23 DIAGNOSIS — M25551 Pain in right hip: Secondary | ICD-10-CM | POA: Diagnosis not present

## 2021-02-24 DIAGNOSIS — H268 Other specified cataract: Secondary | ICD-10-CM | POA: Diagnosis not present

## 2021-02-24 DIAGNOSIS — H401411 Capsular glaucoma with pseudoexfoliation of lens, right eye, mild stage: Secondary | ICD-10-CM | POA: Diagnosis not present

## 2021-03-01 DIAGNOSIS — M9905 Segmental and somatic dysfunction of pelvic region: Secondary | ICD-10-CM | POA: Diagnosis not present

## 2021-03-01 DIAGNOSIS — M25551 Pain in right hip: Secondary | ICD-10-CM | POA: Diagnosis not present

## 2021-03-01 DIAGNOSIS — M5116 Intervertebral disc disorders with radiculopathy, lumbar region: Secondary | ICD-10-CM | POA: Diagnosis not present

## 2021-03-01 DIAGNOSIS — M9903 Segmental and somatic dysfunction of lumbar region: Secondary | ICD-10-CM | POA: Diagnosis not present

## 2021-03-31 ENCOUNTER — Other Ambulatory Visit: Payer: Self-pay | Admitting: Cardiology

## 2021-04-14 DIAGNOSIS — S60032A Contusion of left middle finger without damage to nail, initial encounter: Secondary | ICD-10-CM | POA: Diagnosis not present

## 2021-04-22 ENCOUNTER — Other Ambulatory Visit: Payer: Self-pay | Admitting: Urology

## 2021-04-22 DIAGNOSIS — N400 Enlarged prostate without lower urinary tract symptoms: Secondary | ICD-10-CM

## 2021-05-03 DIAGNOSIS — I10 Essential (primary) hypertension: Secondary | ICD-10-CM | POA: Diagnosis not present

## 2021-05-03 DIAGNOSIS — N400 Enlarged prostate without lower urinary tract symptoms: Secondary | ICD-10-CM | POA: Diagnosis not present

## 2021-05-03 DIAGNOSIS — Z79899 Other long term (current) drug therapy: Secondary | ICD-10-CM | POA: Diagnosis not present

## 2021-05-03 DIAGNOSIS — E1129 Type 2 diabetes mellitus with other diabetic kidney complication: Secondary | ICD-10-CM | POA: Diagnosis not present

## 2021-05-04 ENCOUNTER — Encounter: Payer: Self-pay | Admitting: Urology

## 2021-05-04 ENCOUNTER — Ambulatory Visit: Payer: Medicare PPO | Admitting: Urology

## 2021-05-04 ENCOUNTER — Other Ambulatory Visit: Payer: Self-pay

## 2021-05-04 VITALS — BP 174/71 | HR 68 | Temp 98.6°F

## 2021-05-04 DIAGNOSIS — N401 Enlarged prostate with lower urinary tract symptoms: Secondary | ICD-10-CM

## 2021-05-04 DIAGNOSIS — R350 Frequency of micturition: Secondary | ICD-10-CM | POA: Diagnosis not present

## 2021-05-04 DIAGNOSIS — R351 Nocturia: Secondary | ICD-10-CM | POA: Diagnosis not present

## 2021-05-04 DIAGNOSIS — N400 Enlarged prostate without lower urinary tract symptoms: Secondary | ICD-10-CM

## 2021-05-04 NOTE — Progress Notes (Signed)
H&P  History of Present Illness:  This man presents today for follow-up of BPH symptoms as well as nocturia.   It has been felt that some of his nocturia may well be due to internal polyuria i.e. mobilization of peripheral edema at night.  At his last visit it was recommended that he use his HCTZ to 25 mg a day, limit the sodium and wear compression socks.   4.26.2022: Since his last visit, he still has urinary frequency.  He gave the a voiding diary.  Average void is becoming 3 and 4 ounces.  He does have frequency because of this.  His nighttime voided volumes are about the same as his daytime although his first morning void is up to 6 ounces.  He is still on tamsulosin.  11.15.2022: IPSS 9  QoL score 3  He was given samples of Myrbetriq at his last visit.  He did not take those.  He has no significant daytime symptoms, but still has nocturia x4.  He does not limit his afternoon and evening fluid intake, he does have some pedal edema.  He does not always wear compression socks.  Past Medical History:  Diagnosis Date   Asymptomatic PVCs    Diabetes mellitus    well controlled   Diastolic dysfunction    Hyperlipidemia    Hypertension    Mitral regurgitation    mild    Past Surgical History:  Procedure Laterality Date   INTRAMEDULLARY (IM) NAIL INTERTROCHANTERIC Right 12/09/2017   Procedure: INTRAMEDULLARY (IM) NAIL INTERTROCHANTRIC;  Surgeon: Rod Can, MD;  Location: [facility];  Service: Orthopedics;  Laterality: Right;    Home Medications:  Allergies as of 05/04/2021       Reactions   Enalapril Maleate Other (See Comments)   swelling   Losartan Other (See Comments)   Makes him feel jittery   Menthol Itching   Vasotec Swelling   Sulfa Antibiotics Other (See Comments)   Doesn't recall reaction   Sulfamethoxazole Other (See Comments)   Doesn't recall reaction        Medication List        Accurate as of May 04, 2021  1:07 PM. If you have any questions, ask  your nurse or doctor.          Accu-Chek Aviva Plus test strip Generic drug: glucose blood   Accu-Chek Aviva Plus w/Device Kit   Accu-Chek Softclix Lancets lancets   amLODipine 10 MG tablet Commonly known as: NORVASC Take 1 tablet (10 mg total) by mouth daily.   aspirin 81 MG chewable tablet Chew 1 tablet (81 mg total) by mouth daily.   atorvastatin 10 MG tablet Commonly known as: LIPITOR TAKE ONE-HALF TABLET BY MOUTH DAILY   carvedilol 6.25 MG tablet Commonly known as: COREG TAKE ONE TABLET BY MOUTH TWICE A DAY WITH A MEAL   clotrimazole-betamethasone cream Commonly known as: Lotrisone Apply to affected area 2 times daily   fexofenadine 180 MG tablet Commonly known as: ALLEGRA Take 180 mg by mouth daily.   glipiZIDE 5 MG 24 hr tablet Commonly known as: GLUCOTROL XL Take 5 mg by mouth daily.   hydrochlorothiazide 25 MG tablet Commonly known as: HYDRODIURIL Take 1 tablet (25 mg total) by mouth daily. What changed: how much to take   latanoprost 0.005 % ophthalmic solution Commonly known as: XALATAN Place 1 drop into the right eye daily.   Lutein-Zeaxanthin 25-5 MG Caps Take 1 capsule by mouth daily.   MAGNESIUM PO Take 400 mg  by mouth daily.   pyridOXINE 50 MG tablet Commonly known as: VITAMIN B-6 Take 25 mg by mouth daily.   tamsulosin 0.4 MG Caps capsule Commonly known as: FLOMAX Take 1 capsule (0.4 mg total) by mouth daily.   VITAMIN B 12 PO Take 1,000 mcg by mouth daily.   Vitamin D-3 25 MCG (1000 UT) Caps Take 1,000 Units by mouth daily.        Allergies:  Allergies  Allergen Reactions   Enalapril Maleate Other (See Comments)    swelling   Losartan Other (See Comments)    Makes him feel jittery   Menthol Itching   Vasotec Swelling   Sulfa Antibiotics Other (See Comments)    Doesn't recall reaction   Sulfamethoxazole Other (See Comments)    Doesn't recall reaction    Family History  Problem Relation Age of Onset   Stroke  Father    Diabetes Father    Hypertension Father    Hypertension Mother    Arthritis Mother    Hypertension Brother    Stroke Brother    Kidney disease Brother     Social History:  reports that he has never smoked. He has never used smokeless tobacco. He reports that he does not drink alcohol and does not use drugs.  ROS: A complete review of systems was performed.  All systems are negative except for pertinent findings as noted.  Physical Exam:  Vital signs in last 24 hours: There were no vitals taken for this visit. Constitutional:  Alert and oriented, No acute distress Cardiovascular: Regular rate.  2+ pretibial edema bilaterally Respiratory: Normal respiratory effort Neurologic: Grossly intact, no focal deficits Psychiatric: Normal mood and affect  I have reviewed prior pt notes  I have reviewed notes from referring/previous physicians  I have reviewed urinalysis results  IPSS reviewed   Impression/Assessment:  1.  BPH, on tamsulosin but with excellent IPSS score  2.  [place] likely due to nocturnal polyuria  Plan:  1.  I did discuss with him the fact that if he decrease his afternoon and evening fluids he more than likely will not wake up as much to urinate  2.  Continue to wear compression socks  3.  He does not need to take the Myrbetriq  4.  I did let him know that he can try to wean off of the tamsulosin if he wants  5.  I will see back in a year

## 2021-05-04 NOTE — Progress Notes (Signed)
Urological Symptom Review  Patient is experiencing the following symptoms: Frequent urination Get up at night to urinate Weak stream   Review of Systems  Gastrointestinal (upper)  : Negative for upper GI symptoms  Gastrointestinal (lower) : Negative for lower GI symptoms  Constitutional : Negative for symptoms  Skin: Skin rash/lesion  Eyes: Negative for eye symptoms  Ear/Nose/Throat : Negative for Ear/Nose/Throat symptoms  Hematologic/Lymphatic: Negative for Hematologic/Lymphatic symptoms  Cardiovascular : Negative for cardiovascular symptoms  Respiratory : Negative for respiratory symptoms  Endocrine: Negative for endocrine symptoms  Musculoskeletal: Negative for musculoskeletal symptoms  Neurological: Negative for neurological symptoms  Psychologic: Negative for psychiatric symptoms

## 2021-05-05 DIAGNOSIS — H353132 Nonexudative age-related macular degeneration, bilateral, intermediate dry stage: Secondary | ICD-10-CM | POA: Diagnosis not present

## 2021-05-05 DIAGNOSIS — H268 Other specified cataract: Secondary | ICD-10-CM | POA: Diagnosis not present

## 2021-05-05 DIAGNOSIS — H25813 Combined forms of age-related cataract, bilateral: Secondary | ICD-10-CM | POA: Diagnosis not present

## 2021-05-05 DIAGNOSIS — E119 Type 2 diabetes mellitus without complications: Secondary | ICD-10-CM | POA: Diagnosis not present

## 2021-05-10 DIAGNOSIS — E1122 Type 2 diabetes mellitus with diabetic chronic kidney disease: Secondary | ICD-10-CM | POA: Diagnosis not present

## 2021-05-10 DIAGNOSIS — E785 Hyperlipidemia, unspecified: Secondary | ICD-10-CM | POA: Diagnosis not present

## 2021-05-10 DIAGNOSIS — I10 Essential (primary) hypertension: Secondary | ICD-10-CM | POA: Diagnosis not present

## 2021-05-10 DIAGNOSIS — R7309 Other abnormal glucose: Secondary | ICD-10-CM | POA: Diagnosis not present

## 2021-05-10 DIAGNOSIS — T465X5A Adverse effect of other antihypertensive drugs, initial encounter: Secondary | ICD-10-CM | POA: Diagnosis not present

## 2021-05-10 DIAGNOSIS — R809 Proteinuria, unspecified: Secondary | ICD-10-CM | POA: Diagnosis not present

## 2021-05-25 NOTE — Progress Notes (Signed)
Cardiology Office Note   Date:  05/28/2021   ID:  Shawn Stephenson, DOB 1930/06/17, MRN [id number]  PCP:  Asencion Noble, MD  Cardiologist: Peter Martinique MD  Chief Complaint  Patient presents with   Hypertension   pvc       History of Present Illness: Shawn Stephenson is a 87 y.o. male who presents for follow up PVCs and hypertensive heart disease.   He has a past history of frequent PVCs and a past history of hypertensive cardiovascular disease and a history of diastolic dysfunction of the left ventricle. He also has hypercholesterolemia. He is a mild diabetic. The patient had a Myoview stress test on 10/01/13 which showed no ischemia and showed a very small apical scar.  The study was not gated because of PVCs and PACs. His last echocardiogram was 02/10/14 and showed normal left ventricular systolic function with ejection fraction of 65%.  There was aortic valve sclerosis without stenosis. Most recent Echo in June 2019 showed focal basal septal hypertrophy otherwise normal LV.   His  Daughter Tonna Boehringer is a patient of mine and had septal myectomy therapy for hypertrophic obstructive cardiomyopathy done at [facility] clinic.   On follow up today he is doing very well. He denies any chest pain, dyspnea, palpitations or dizziness. He reports he is active. States he is working for [employer] now.  Reports BP at home is 130/75.   Past Medical History:  Diagnosis Date   Asymptomatic PVCs    Diabetes mellitus    well controlled   Diastolic dysfunction    Hyperlipidemia    Hypertension    Mitral regurgitation    mild    Past Surgical History:  Procedure Laterality Date   INTRAMEDULLARY (IM) NAIL INTERTROCHANTERIC Right 12/09/2017   Procedure: INTRAMEDULLARY (IM) NAIL INTERTROCHANTRIC;  Surgeon: Rod Can, MD;  Location: [facility];  Service: Orthopedics;  Laterality: Right;     Current Outpatient Medications  Medication Sig Dispense Refill   ACCU-CHEK AVIVA PLUS test  strip      ACCU-CHEK SOFTCLIX LANCETS lancets      aspirin 81 MG chewable tablet Chew 1 tablet (81 mg total) by mouth daily. 60 tablet 1   Blood Glucose Monitoring Suppl (ACCU-CHEK AVIVA PLUS) W/DEVICE KIT      Cholecalciferol (VITAMIN D-3) 1000 UNITS CAPS Take 1,000 Units by mouth daily.     clotrimazole-betamethasone (LOTRISONE) cream Apply to affected area 2 times daily 15 g 3   Cyanocobalamin (VITAMIN B 12 PO) Take 1,000 mcg by mouth daily.      fexofenadine (ALLEGRA) 180 MG tablet Take 180 mg by mouth daily.     glipiZIDE (GLUCOTROL) 5 MG 24 hr tablet Take 5 mg by mouth daily.     hydrochlorothiazide (HYDRODIURIL) 25 MG tablet Take 1 tablet (25 mg total) by mouth daily. (Patient taking differently: Take 12.5 mg by mouth daily.) 90 tablet 3   latanoprost (XALATAN) 0.005 % ophthalmic solution Place 1 drop into the right eye daily.     Lutein-Zeaxanthin 25-5 MG CAPS Take 1 capsule by mouth daily.     MAGNESIUM PO Take 400 mg by mouth daily.     pyridOXINE (VITAMIN B-6) 50 MG tablet Take 25 mg by mouth daily.      tamsulosin (FLOMAX) 0.4 MG CAPS capsule Take 1 capsule (0.4 mg total) by mouth daily. 90 capsule 3   amLODipine (NORVASC) 10 MG tablet Take 1 tablet (10 mg total) by mouth daily. 90 tablet 3  atorvastatin (LIPITOR) 10 MG tablet TAKE ONE-HALF TABLET BY MOUTH DAILY 45 tablet 3   carvedilol (COREG) 6.25 MG tablet TAKE ONE TABLET BY MOUTH TWICE A DAY WITH A MEAL 180 tablet 3   No current facility-administered medications for this visit.    Allergies:   Enalapril maleate, Losartan, Menthol, Vasotec, Sulfa antibiotics, and Sulfamethoxazole    Social History:  The patient  reports that he has never smoked. He has never used smokeless tobacco. He reports that he does not drink alcohol and does not use drugs.   Family History:  The patient's family history includes Arthritis in his mother; Diabetes in his father; Hypertension in his brother, father, and mother; Kidney disease in his  brother; Stroke in his brother and father.    ROS:  Please see the history of present illness.   Otherwise, review of systems are positive for none.   All other systems are reviewed and negative.    PHYSICAL EXAM: VS:  BP (!) 160/74 (BP Location: Left Arm)   Pulse (!) 58   Ht 5' 10" (1.778 m)   Wt 171 lb 3.2 oz (77.7 kg)   SpO2 98%   BMI 24.56 kg/m  , BMI Body mass index is 24.56 kg/m. GENERAL:  Well appearing elderly WM in NAD HEENT:  PERRL, EOMI, sclera are clear. Oropharynx is clear. NECK:  No jugular venous distention, carotid upstroke brisk and symmetric, no bruits, no thyromegaly or adenopathy LUNGS:  Clear to auscultation bilaterally CHEST:  Unremarkable HEART:  RRR,  PMI not displaced or sustained,S1 and S2 within normal limits, no S3, no S4: no clicks, no rubs, no murmurs ABD:  Soft, nontender. BS +, no masses or bruits. No hepatomegaly, no splenomegaly EXT:  2 + pulses throughout, no edema, no cyanosis no clubbing SKIN:  Warm and dry.  No rashes NEURO:  Alert and oriented x 3. Cranial nerves II through XII intact. PSYCH:  Cognitively intact   EKG:  EKG is ordered today. Sinus rate 58.  LAFB, RBBB,LVH. No change since last year.  I have personally reviewed and interpreted this study.  Recent Labs: No results found for requested labs within last 8760 hours.    Lipid Panel No results found for: CHOL, TRIG, HDL, CHOLHDL, VLDL, LDLCALC, LDLDIRECT    Wt Readings from Last 3 Encounters:  05/28/21 171 lb 3.2 oz (77.7 kg)  12/29/20 157 lb 3.2 oz (71.3 kg)  10/13/20 164 lb (74.4 kg)    Labs reviewed from 12/28/15: cholesterol 149, triglycerides 74, LDL 88, HDL 46. Glucose 159, A1c 6.4%. Other chemistries, CBC, UA normal. Dated 02/03/17: cholesterol 134, triglycerides 77, HDL 41. A1c 6.7%. CBC and chemistries normal.  Dated 01/08/18: normal chemistries Dated 02/23/18 normal Hgb Dated 07/06/18 A1c 7.1%.  Dated 04/09/20: A1c 7.4%. cholesterol 146, triglycerides 79, HDL 46, LDL  78. Glucose 188. Potassium 3.4. otherwise CMET and CBC normal. Dated 12/22/20: a1C 7.1%.   Echo 12/10/17: Study Conclusions   - Left ventricle: The cavity size was normal. There was moderate   focal basal hypertrophy of the septum. Systolic function was   vigorous. The estimated ejection fraction was in the range of 65%   to 70%. Wall motion was normal; there were no regional wall   motion abnormalities. Doppler parameters are consistent with   abnormal left ventricular relaxation (grade 1 diastolic   dysfunction). - Aortic valve: Trileaflet; mildly thickened, mildly calcified   leaflets. There was mild regurgitation.  ASSESSMENT AND PLAN:  1. Hypertensive heart disease  without heart failure. BP is generally well controlled.  He is asymptomatic. Continue current Rx.  2. Diastolic dysfunction 3. PVCs-no symptoms 4. Aortic valve disease with mild aortic insufficiency  Follow up in one year   Signed, Peter Martinique MD, [facility]   05/28/2021 3:53 PM    [place]

## 2021-05-27 ENCOUNTER — Other Ambulatory Visit: Payer: Self-pay | Admitting: Cardiology

## 2021-05-28 ENCOUNTER — Encounter: Payer: Self-pay | Admitting: Cardiology

## 2021-05-28 ENCOUNTER — Ambulatory Visit: Payer: Medicare PPO | Admitting: Cardiology

## 2021-05-28 ENCOUNTER — Other Ambulatory Visit: Payer: Self-pay

## 2021-05-28 VITALS — BP 160/74 | HR 58 | Ht 70.0 in | Wt 171.2 lb

## 2021-05-28 DIAGNOSIS — I351 Nonrheumatic aortic (valve) insufficiency: Secondary | ICD-10-CM | POA: Diagnosis not present

## 2021-05-28 DIAGNOSIS — I119 Hypertensive heart disease without heart failure: Secondary | ICD-10-CM | POA: Diagnosis not present

## 2021-05-28 DIAGNOSIS — I493 Ventricular premature depolarization: Secondary | ICD-10-CM | POA: Diagnosis not present

## 2021-05-28 MED ORDER — ATORVASTATIN CALCIUM 10 MG PO TABS: ORAL_TABLET | ORAL | 3 refills | Status: DC

## 2021-05-28 MED ORDER — CARVEDILOL 6.25 MG PO TABS: ORAL_TABLET | ORAL | 3 refills | Status: DC

## 2021-05-28 MED ORDER — AMLODIPINE BESYLATE 10 MG PO TABS: 10.0000 mg | ORAL_TABLET | Freq: Every day | ORAL | 3 refills | Status: AC

## 2021-06-21 DIAGNOSIS — R350 Frequency of micturition: Secondary | ICD-10-CM | POA: Diagnosis not present

## 2021-06-21 DIAGNOSIS — N419 Inflammatory disease of prostate, unspecified: Secondary | ICD-10-CM | POA: Diagnosis not present

## 2021-06-22 ENCOUNTER — Other Ambulatory Visit: Payer: Self-pay

## 2021-06-22 ENCOUNTER — Ambulatory Visit: Payer: Medicare PPO | Admitting: Urology

## 2021-06-22 ENCOUNTER — Encounter: Payer: Self-pay | Admitting: Urology

## 2021-06-22 VITALS — BP 171/72 | HR 67

## 2021-06-22 DIAGNOSIS — R339 Retention of urine, unspecified: Secondary | ICD-10-CM

## 2021-06-22 DIAGNOSIS — R300 Dysuria: Secondary | ICD-10-CM

## 2021-06-22 DIAGNOSIS — R351 Nocturia: Secondary | ICD-10-CM | POA: Diagnosis not present

## 2021-06-22 DIAGNOSIS — N400 Enlarged prostate without lower urinary tract symptoms: Secondary | ICD-10-CM | POA: Diagnosis not present

## 2021-06-22 LAB — URINALYSIS, ROUTINE W REFLEX MICROSCOPIC
Bilirubin, UA: NEGATIVE
Ketones, UA: NEGATIVE
Leukocytes,UA: NEGATIVE
Nitrite, UA: NEGATIVE
Protein,UA: NEGATIVE
RBC, UA: NEGATIVE
Specific Gravity, UA: 1.02 (ref 1.005–1.030)
Urobilinogen, Ur: 0.2 mg/dL (ref 0.2–1.0)
pH, UA: 7 (ref 5.0–7.5)

## 2021-06-22 NOTE — Progress Notes (Signed)
Urological Symptom Review  Patient is experiencing the following symptoms: Frequent urination Hard to postpone urination Burning/pain with urination Get up at night to urinate Stream starts and stops Trouble starting stream Have to strain to urinate Urinary tract infection   Review of Systems  Gastrointestinal (upper)  : Negative for upper GI symptoms  Gastrointestinal (lower) : Negative for lower GI symptoms  Constitutional : Negative for symptoms  Skin: Negative for skin symptoms  Eyes: Negative for eye symptoms  Ear/Nose/Throat : Negative for Ear/Nose/Throat symptoms  Hematologic/Lymphatic: Negative for Hematologic/Lymphatic symptoms  Cardiovascular : Negative for cardiovascular symptoms  Respiratory : Negative for respiratory symptoms  Endocrine: Negative for endocrine symptoms  Musculoskeletal: Negative for musculoskeletal symptoms  Neurological: Negative for neurological symptoms  Psychologic: Negative for psychiatric symptoms

## 2021-06-22 NOTE — Progress Notes (Signed)
History of Present Illness: Last seen a couple of months ago for nocturia.  It was thought that this was secondary to nocturnal polyuria.  It was recommended that he limit p.m. fluid intake as well as use compression socks.  Prior notes:   It has been felt that some of his nocturia may well be due to internal polyuria i.e. mobilization of peripheral edema at night.  At his last visit it was recommended that he use his HCTZ to 25 mg a day, limit the sodium and wear compression socks.   4.26.2022: Since his last visit, he still has urinary frequency.  He gave the a voiding diary.  Average void is becoming 3 and 4 ounces.  He does have frequency because of this.  His nighttime voided volumes are about the same as his daytime although his first morning void is up to 6 ounces.  He is still on tamsulosin.   11.15.2022: IPSS 9  QoL score 3  He was given samples of Myrbetriq at his last visit.  He did not take those.  He has no significant daytime symptoms, but still has nocturia x4.  He does not limit his afternoon and evening fluid intake, he does have some pedal edema.  He does not always wear compression socks.  1.3.2023: Work in visit today for about a week long history of dysuria, feeling of incomplete emptying.  No gross hematuria, fever, chills.  No constipation.  Seen by Dr. Jomarie Longs in urgent care yesterday and was told that he had prostatitis.  Given Cipro.  This is not well-tolerated by the patient.   Past Medical History:  Diagnosis Date   Asymptomatic PVCs    Diabetes mellitus    well controlled   Diastolic dysfunction    Hyperlipidemia    Hypertension    Mitral regurgitation    mild    Past Surgical History:  Procedure Laterality Date   INTRAMEDULLARY (IM) NAIL INTERTROCHANTERIC Right 12/09/2017   Procedure: INTRAMEDULLARY (IM) NAIL INTERTROCHANTRIC;  Surgeon: Rod Can, MD;  Location: [facility];  Service: Orthopedics;  Laterality: Right;    Home Medications:  Allergies as of  06/22/2021       Reactions   Enalapril Maleate Other (See Comments)   swelling   Losartan Other (See Comments)   Makes him feel jittery   Menthol Itching   Vasotec Swelling   Sulfa Antibiotics Other (See Comments)   Doesn't recall reaction   Sulfamethoxazole Other (See Comments)   Doesn't recall reaction        Medication List        Accurate as of June 22, 2021  1:07 PM. If you have any questions, ask your nurse or doctor.          Accu-Chek Aviva Plus test strip Generic drug: glucose blood   Accu-Chek Aviva Plus w/Device Kit   Accu-Chek Softclix Lancets lancets   amLODipine 10 MG tablet Commonly known as: NORVASC Take 1 tablet (10 mg total) by mouth daily.   aspirin 81 MG chewable tablet Chew 1 tablet (81 mg total) by mouth daily.   atorvastatin 10 MG tablet Commonly known as: LIPITOR TAKE ONE-HALF TABLET BY MOUTH DAILY   carvedilol 6.25 MG tablet Commonly known as: COREG TAKE ONE TABLET BY MOUTH TWICE A DAY WITH A MEAL   clotrimazole-betamethasone cream Commonly known as: Lotrisone Apply to affected area 2 times daily   fexofenadine 180 MG tablet Commonly known as: ALLEGRA Take 180 mg by mouth daily.   glipiZIDE  5 MG 24 hr tablet Commonly known as: GLUCOTROL XL Take 5 mg by mouth daily.   hydrochlorothiazide 25 MG tablet Commonly known as: HYDRODIURIL Take 1 tablet (25 mg total) by mouth daily. What changed: how much to take   latanoprost 0.005 % ophthalmic solution Commonly known as: XALATAN Place 1 drop into the right eye daily.   Lutein-Zeaxanthin 25-5 MG Caps Take 1 capsule by mouth daily.   MAGNESIUM PO Take 400 mg by mouth daily.   pyridOXINE 50 MG tablet Commonly known as: VITAMIN B-6 Take 25 mg by mouth daily.   tamsulosin 0.4 MG Caps capsule Commonly known as: FLOMAX Take 1 capsule (0.4 mg total) by mouth daily.   VITAMIN B 12 PO Take 1,000 mcg by mouth daily.   Vitamin D-3 25 MCG (1000 UT) Caps Take 1,000 Units by  mouth daily.        Allergies:  Allergies  Allergen Reactions   Enalapril Maleate Other (See Comments)    swelling   Losartan Other (See Comments)    Makes him feel jittery   Menthol Itching   Vasotec Swelling   Sulfa Antibiotics Other (See Comments)    Doesn't recall reaction   Sulfamethoxazole Other (See Comments)    Doesn't recall reaction    Family History  Problem Relation Age of Onset   Stroke Father    Diabetes Father    Hypertension Father    Hypertension Mother    Arthritis Mother    Hypertension Brother    Stroke Brother    Kidney disease Brother     Social History:  reports that he has never smoked. He has never used smokeless tobacco. He reports that he does not drink alcohol and does not use drugs.  ROS: A complete review of systems was performed.  All systems are negative except for pertinent findings as noted.  Physical Exam:  Vital signs in last 24 hours: There were no vitals taken for this visit. Constitutional:  Alert and oriented, No acute distress Cardiovascular: Regular rate  Respiratory: Normal respiratory effort Abdomen: No tenderness, no mass. Rectal: Prostate 60 g, symmetrical, nonnodular, nontender. Psychiatric: Normal mood and affect  I have reviewed prior pt notes  I have reviewed notes from referring/previous physicians  I have reviewed urinalysis results-urinalysis is clear  I have independently reviewed prior imaging-bladder scan volume 150 mL.    Impression/Assessment:  1.  BPH with moderate symptomatology.  I do not think he is necessarily having a UTI at the present time  2.  Dysuria, exam/urinalysis normal  3.  Incomplete bladder emptying-moderate residual urine volume  Plan:  1.  Temporarily, I think it is okay to double up on the tamsulosin, but just for a few days  2.  Stop Cipro  3.  Follow-up in 2 to 3 weeks if symptoms continue, otherwise keep scheduled follow-up

## 2021-08-11 DIAGNOSIS — H25813 Combined forms of age-related cataract, bilateral: Secondary | ICD-10-CM | POA: Diagnosis not present

## 2021-08-17 DIAGNOSIS — H52201 Unspecified astigmatism, right eye: Secondary | ICD-10-CM | POA: Diagnosis not present

## 2021-08-17 DIAGNOSIS — H25813 Combined forms of age-related cataract, bilateral: Secondary | ICD-10-CM | POA: Diagnosis not present

## 2021-08-17 DIAGNOSIS — H25811 Combined forms of age-related cataract, right eye: Secondary | ICD-10-CM | POA: Diagnosis not present

## 2021-09-09 DIAGNOSIS — H25812 Combined forms of age-related cataract, left eye: Secondary | ICD-10-CM | POA: Diagnosis not present

## 2021-09-09 DIAGNOSIS — H52202 Unspecified astigmatism, left eye: Secondary | ICD-10-CM | POA: Diagnosis not present

## 2021-09-16 DIAGNOSIS — E1129 Type 2 diabetes mellitus with other diabetic kidney complication: Secondary | ICD-10-CM | POA: Diagnosis not present

## 2021-09-16 DIAGNOSIS — E785 Hyperlipidemia, unspecified: Secondary | ICD-10-CM | POA: Diagnosis not present

## 2021-09-16 DIAGNOSIS — I10 Essential (primary) hypertension: Secondary | ICD-10-CM | POA: Diagnosis not present

## 2021-09-22 DIAGNOSIS — E1122 Type 2 diabetes mellitus with diabetic chronic kidney disease: Secondary | ICD-10-CM | POA: Diagnosis not present

## 2021-09-22 DIAGNOSIS — I10 Essential (primary) hypertension: Secondary | ICD-10-CM | POA: Diagnosis not present

## 2021-09-22 DIAGNOSIS — R7309 Other abnormal glucose: Secondary | ICD-10-CM | POA: Diagnosis not present

## 2021-12-23 DIAGNOSIS — E1129 Type 2 diabetes mellitus with other diabetic kidney complication: Secondary | ICD-10-CM | POA: Diagnosis not present

## 2021-12-23 DIAGNOSIS — Z961 Presence of intraocular lens: Secondary | ICD-10-CM | POA: Diagnosis not present

## 2021-12-23 DIAGNOSIS — H353132 Nonexudative age-related macular degeneration, bilateral, intermediate dry stage: Secondary | ICD-10-CM | POA: Diagnosis not present

## 2021-12-23 DIAGNOSIS — E119 Type 2 diabetes mellitus without complications: Secondary | ICD-10-CM | POA: Diagnosis not present

## 2021-12-28 DIAGNOSIS — R7309 Other abnormal glucose: Secondary | ICD-10-CM | POA: Diagnosis not present

## 2021-12-28 DIAGNOSIS — E1122 Type 2 diabetes mellitus with diabetic chronic kidney disease: Secondary | ICD-10-CM | POA: Diagnosis not present

## 2021-12-28 DIAGNOSIS — I10 Essential (primary) hypertension: Secondary | ICD-10-CM | POA: Diagnosis not present

## 2022-04-22 DIAGNOSIS — E1129 Type 2 diabetes mellitus with other diabetic kidney complication: Secondary | ICD-10-CM | POA: Diagnosis not present

## 2022-04-22 DIAGNOSIS — I10 Essential (primary) hypertension: Secondary | ICD-10-CM | POA: Diagnosis not present

## 2022-04-22 DIAGNOSIS — Z79899 Other long term (current) drug therapy: Secondary | ICD-10-CM | POA: Diagnosis not present

## 2022-04-29 DIAGNOSIS — R7309 Other abnormal glucose: Secondary | ICD-10-CM | POA: Diagnosis not present

## 2022-04-29 DIAGNOSIS — E785 Hyperlipidemia, unspecified: Secondary | ICD-10-CM | POA: Diagnosis not present

## 2022-04-29 DIAGNOSIS — R809 Proteinuria, unspecified: Secondary | ICD-10-CM | POA: Diagnosis not present

## 2022-04-29 DIAGNOSIS — I10 Essential (primary) hypertension: Secondary | ICD-10-CM | POA: Diagnosis not present

## 2022-04-29 DIAGNOSIS — E1122 Type 2 diabetes mellitus with diabetic chronic kidney disease: Secondary | ICD-10-CM | POA: Diagnosis not present

## 2022-04-29 DIAGNOSIS — Z23 Encounter for immunization: Secondary | ICD-10-CM | POA: Diagnosis not present

## 2022-05-02 NOTE — Progress Notes (Signed)
History of Present Illness: 88 year old male presents for follow-up of lower urinary tract symptoms.  He has nocturia felt to be due to nocturnal mobilization of peripheral edema as well as urinary frequency and urgency. He is on tamsulosin as well as morning dose of hydrochlorothiazide.  With that medication regimen he still has nocturia x4-5.  No significant daytime symptoms. Past Medical History:  Diagnosis Date   Asymptomatic PVCs    Diabetes mellitus    well controlled   Diastolic dysfunction    Hyperlipidemia    Hypertension    Mitral regurgitation    mild    Past Surgical History:  Procedure Laterality Date   INTRAMEDULLARY (IM) NAIL INTERTROCHANTERIC Right 12/09/2017   Procedure: INTRAMEDULLARY (IM) NAIL INTERTROCHANTRIC;  Surgeon: Samson Frederic, MD;  Location[facility];  Service: Orthopedics;  Laterality: Right;    Home Medications:  Allergies as of 05/03/2022       Reactions   Enalapril Maleate Other (See Comments)   swelling   Losartan Other (See Comments)   Makes him feel jittery   Menthol Itching   Vasotec Swelling   Sulfa Antibiotics Other (See Comments)   Doesn't recall reaction   Sulfamethoxazole Other (See Comments)   Doesn't recall reaction        Medication List        Accurate as of May 02, 2022  9:32 PM. If you have any questions, ask your nurse or doctor.          Accu-Chek Aviva Plus test strip Generic drug: glucose blood   Accu-Chek Aviva Plus w/Device Kit   Accu-Chek Softclix Lancets lancets   amLODipine 10 MG tablet Commonly known as: NORVASC Take 1 tablet (10 mg total) by mouth daily.   aspirin 81 MG chewable tablet Chew 1 tablet (81 mg total) by mouth daily.   atorvastatin 10 MG tablet Commonly known as: LIPITOR TAKE ONE-HALF TABLET BY MOUTH DAILY   carvedilol 6.25 MG tablet Commonly known as: COREG TAKE ONE TABLET BY MOUTH TWICE A DAY WITH A MEAL   clotrimazole-betamethasone cream Commonly known as:  Lotrisone Apply to affected area 2 times daily   fexofenadine 180 MG tablet Commonly known as: ALLEGRA Take 180 mg by mouth daily.   glipiZIDE 5 MG 24 hr tablet Commonly known as: GLUCOTROL XL Take 5 mg by mouth daily.   hydrochlorothiazide 25 MG tablet Commonly known as: HYDRODIURIL Take 1 tablet (25 mg total) by mouth daily. What changed: how much to take   latanoprost 0.005 % ophthalmic solution Commonly known as: XALATAN Place 1 drop into the right eye daily.   Lutein-Zeaxanthin 25-5 MG Caps Take 1 capsule by mouth daily.   MAGNESIUM PO Take 400 mg by mouth daily.   pyridOXINE 50 MG tablet Commonly known as: VITAMIN B6 Take 25 mg by mouth daily.   tamsulosin 0.4 MG Caps capsule Commonly known as: FLOMAX Take 1 capsule (0.4 mg total) by mouth daily.   VITAMIN B 12 PO Take 1,000 mcg by mouth daily.   Vitamin D-3 25 MCG (1000 UT) Caps Take 1,000 Units by mouth daily.        Allergies:  Allergies  Allergen Reactions   Enalapril Maleate Other (See Comments)    swelling   Losartan Other (See Comments)    Makes him feel jittery   Menthol Itching   Vasotec Swelling   Sulfa Antibiotics Other (See Comments)    Doesn't recall reaction   Sulfamethoxazole Other (See Comments)    Doesn't  recall reaction    Family History  Problem Relation Age of Onset   Stroke Father    Diabetes Father    Hypertension Father    Hypertension Mother    Arthritis Mother    Hypertension Brother    Stroke Brother    Kidney disease Brother     Social History:  reports that he has never smoked. He has never used smokeless tobacco. He reports that he does not drink alcohol and does not use drugs.  ROS: A complete review of systems was performed.  All systems are negative except for pertinent findings as noted.  Physical Exam:  Vital signs in last 24 hours: There were no vitals taken for this visit. Constitutional:  Alert and oriented, No acute distress Cardiovascular:  Regular rate  Respiratory: Normal respiratory effort Neurologic: Grossly intact, no focal deficits Psychiatric: Normal mood and affect  I have reviewed prior pt notes  I have reviewed urinalysis results  I have independently reviewed IPSS form  I have reviewed prior PSA results    Impression/Assessment:  Nocturia-most likely nocturnal polyuria and non bladder/prostate related issues  Plan:  1.  I outlined treatment options-continue to just deal with his symptoms, or consider adding DDAVP.  I let him know that the most common side effect in a 88 year old would be hyponatremia.  2.  I gave him the name of DDAVP-desmopressin to investigate.  If he wants to start therapy, he will call and I will send in a prescription as well set up an order for basic metabolic panel within 1 week of starting  3.  Otherwise, I will see him back in 1 year

## 2022-05-03 ENCOUNTER — Encounter: Payer: Self-pay | Admitting: Urology

## 2022-05-03 ENCOUNTER — Ambulatory Visit: Payer: Medicare PPO | Admitting: Urology

## 2022-05-03 VITALS — BP 154/80 | HR 66

## 2022-05-03 DIAGNOSIS — R351 Nocturia: Secondary | ICD-10-CM | POA: Diagnosis not present

## 2022-05-03 DIAGNOSIS — R350 Frequency of micturition: Secondary | ICD-10-CM | POA: Diagnosis not present

## 2022-05-03 DIAGNOSIS — N400 Enlarged prostate without lower urinary tract symptoms: Secondary | ICD-10-CM

## 2022-05-03 MED ORDER — TAMSULOSIN HCL 0.4 MG PO CAPS: 0.4000 mg | ORAL_CAPSULE | Freq: Every day | ORAL | 3 refills | Status: DC

## 2022-06-10 ENCOUNTER — Other Ambulatory Visit: Payer: Self-pay | Admitting: Cardiology

## 2022-08-15 DIAGNOSIS — E1129 Type 2 diabetes mellitus with other diabetic kidney complication: Secondary | ICD-10-CM | POA: Diagnosis not present

## 2022-08-29 DIAGNOSIS — I10 Essential (primary) hypertension: Secondary | ICD-10-CM | POA: Diagnosis not present

## 2022-08-29 DIAGNOSIS — R7309 Other abnormal glucose: Secondary | ICD-10-CM | POA: Diagnosis not present

## 2022-08-29 DIAGNOSIS — E1122 Type 2 diabetes mellitus with diabetic chronic kidney disease: Secondary | ICD-10-CM | POA: Diagnosis not present

## 2022-09-03 ENCOUNTER — Other Ambulatory Visit: Payer: Self-pay | Admitting: Cardiology

## 2022-10-03 DIAGNOSIS — M545 Low back pain, unspecified: Secondary | ICD-10-CM | POA: Diagnosis not present

## 2022-11-23 DIAGNOSIS — E1129 Type 2 diabetes mellitus with other diabetic kidney complication: Secondary | ICD-10-CM | POA: Diagnosis not present

## 2022-11-30 DIAGNOSIS — E1122 Type 2 diabetes mellitus with diabetic chronic kidney disease: Secondary | ICD-10-CM | POA: Diagnosis not present

## 2022-11-30 DIAGNOSIS — I10 Essential (primary) hypertension: Secondary | ICD-10-CM | POA: Diagnosis not present

## 2022-11-30 DIAGNOSIS — R7309 Other abnormal glucose: Secondary | ICD-10-CM | POA: Diagnosis not present

## 2023-02-27 DIAGNOSIS — I10 Essential (primary) hypertension: Secondary | ICD-10-CM | POA: Diagnosis not present

## 2023-02-27 DIAGNOSIS — R809 Proteinuria, unspecified: Secondary | ICD-10-CM | POA: Diagnosis not present

## 2023-02-27 DIAGNOSIS — Z79899 Other long term (current) drug therapy: Secondary | ICD-10-CM | POA: Diagnosis not present

## 2023-02-27 DIAGNOSIS — E1129 Type 2 diabetes mellitus with other diabetic kidney complication: Secondary | ICD-10-CM | POA: Diagnosis not present

## 2023-02-27 DIAGNOSIS — E785 Hyperlipidemia, unspecified: Secondary | ICD-10-CM | POA: Diagnosis not present

## 2023-03-06 DIAGNOSIS — R809 Proteinuria, unspecified: Secondary | ICD-10-CM | POA: Diagnosis not present

## 2023-03-06 DIAGNOSIS — T464X5A Adverse effect of angiotensin-converting-enzyme inhibitors, initial encounter: Secondary | ICD-10-CM | POA: Diagnosis not present

## 2023-03-06 DIAGNOSIS — E785 Hyperlipidemia, unspecified: Secondary | ICD-10-CM | POA: Diagnosis not present

## 2023-03-06 DIAGNOSIS — I10 Essential (primary) hypertension: Secondary | ICD-10-CM | POA: Diagnosis not present

## 2023-03-06 DIAGNOSIS — E1122 Type 2 diabetes mellitus with diabetic chronic kidney disease: Secondary | ICD-10-CM | POA: Diagnosis not present

## 2023-05-02 ENCOUNTER — Ambulatory Visit: Payer: Medicare PPO | Admitting: Urology

## 2023-05-02 DIAGNOSIS — R350 Frequency of micturition: Secondary | ICD-10-CM

## 2023-05-02 DIAGNOSIS — N400 Enlarged prostate without lower urinary tract symptoms: Secondary | ICD-10-CM

## 2023-05-02 DIAGNOSIS — R351 Nocturia: Secondary | ICD-10-CM

## 2023-05-25 ENCOUNTER — Other Ambulatory Visit: Payer: Self-pay | Admitting: Cardiology

## 2023-06-01 DIAGNOSIS — X32XXXD Exposure to sunlight, subsequent encounter: Secondary | ICD-10-CM | POA: Diagnosis not present

## 2023-06-01 DIAGNOSIS — C44229 Squamous cell carcinoma of skin of left ear and external auricular canal: Secondary | ICD-10-CM | POA: Diagnosis not present

## 2023-06-01 DIAGNOSIS — L570 Actinic keratosis: Secondary | ICD-10-CM | POA: Diagnosis not present

## 2023-06-09 ENCOUNTER — Other Ambulatory Visit: Payer: Self-pay | Admitting: Urology

## 2023-06-09 DIAGNOSIS — N400 Enlarged prostate without lower urinary tract symptoms: Secondary | ICD-10-CM

## 2023-06-27 ENCOUNTER — Ambulatory Visit: Payer: Medicare PPO | Admitting: Urology

## 2023-08-21 DIAGNOSIS — D485 Neoplasm of uncertain behavior of skin: Secondary | ICD-10-CM | POA: Diagnosis not present

## 2023-08-21 DIAGNOSIS — Z85820 Personal history of malignant melanoma of skin: Secondary | ICD-10-CM | POA: Diagnosis not present

## 2023-08-21 DIAGNOSIS — Z85828 Personal history of other malignant neoplasm of skin: Secondary | ICD-10-CM | POA: Diagnosis not present

## 2023-08-21 DIAGNOSIS — L570 Actinic keratosis: Secondary | ICD-10-CM | POA: Diagnosis not present

## 2023-08-21 DIAGNOSIS — Z08 Encounter for follow-up examination after completed treatment for malignant neoplasm: Secondary | ICD-10-CM | POA: Diagnosis not present

## 2023-08-21 DIAGNOSIS — X32XXXD Exposure to sunlight, subsequent encounter: Secondary | ICD-10-CM | POA: Diagnosis not present

## 2023-08-21 DIAGNOSIS — B351 Tinea unguium: Secondary | ICD-10-CM | POA: Diagnosis not present

## 2023-08-21 DIAGNOSIS — I872 Venous insufficiency (chronic) (peripheral): Secondary | ICD-10-CM | POA: Diagnosis not present

## 2023-08-31 ENCOUNTER — Other Ambulatory Visit: Payer: Self-pay | Admitting: Cardiology

## 2023-08-31 MED ORDER — ATORVASTATIN CALCIUM 10 MG PO TABS: ORAL_TABLET | ORAL | 0 refills | Status: DC

## 2023-09-28 ENCOUNTER — Other Ambulatory Visit: Payer: Self-pay | Admitting: Cardiology

## 2023-10-12 ENCOUNTER — Telehealth: Payer: Self-pay | Admitting: Cardiology

## 2023-10-12 ENCOUNTER — Other Ambulatory Visit: Payer: Self-pay | Admitting: Cardiology

## 2023-10-12 MED ORDER — CARVEDILOL 6.25 MG PO TABS: ORAL_TABLET | ORAL | 1 refills | Status: DC

## 2023-10-12 NOTE — Telephone Encounter (Signed)
*  STAT* If patient is at the pharmacy, call can be transferred to refill team.   1. Which medications need to be refilled? (please list name of each medication and dose if known)   carvedilol  (COREG ) 6.25 MG tablet   2. Would you like to learn more about the convenience, safety, & potential cost savings by using the [facility]?   3. Are you open to using the [facility] (Type [facility]. ).  4. Which pharmacy/location (including street and city if local pharmacy) is medication to be sent to?  [facility] - [place], [place] - [address]   5. Do they need a 30 day or 90 day supply?   Patient stated he still has some medication left.  Patient has appointment scheduled with Dr. [place] on 6/27.

## 2023-10-12 NOTE — Telephone Encounter (Signed)
 Pt's medication was sent to pt's pharmacy as requested. Confirmation received.

## 2023-11-08 DIAGNOSIS — I872 Venous insufficiency (chronic) (peripheral): Secondary | ICD-10-CM | POA: Diagnosis not present

## 2023-11-16 ENCOUNTER — Other Ambulatory Visit: Payer: Self-pay | Admitting: Cardiology

## 2023-11-29 ENCOUNTER — Emergency Department (HOSPITAL_COMMUNITY)

## 2023-11-29 ENCOUNTER — Other Ambulatory Visit: Payer: Self-pay

## 2023-11-29 ENCOUNTER — Observation Stay (HOSPITAL_COMMUNITY)
Admission: EM | Admit: 2023-11-29 | Discharge: 2023-12-01 | Disposition: A | Discharge status: Home / Self Care | Attending: Internal Medicine | Admitting: Internal Medicine

## 2023-11-29 ENCOUNTER — Encounter (HOSPITAL_COMMUNITY): Payer: Self-pay

## 2023-11-29 DIAGNOSIS — E7800 Pure hypercholesterolemia, unspecified: Secondary | ICD-10-CM | POA: Diagnosis not present

## 2023-11-29 DIAGNOSIS — G9341 Metabolic encephalopathy: Secondary | ICD-10-CM | POA: Insufficient documentation

## 2023-11-29 DIAGNOSIS — E1165 Type 2 diabetes mellitus with hyperglycemia: Secondary | ICD-10-CM | POA: Diagnosis not present

## 2023-11-29 DIAGNOSIS — I6782 Cerebral ischemia: Secondary | ICD-10-CM | POA: Diagnosis not present

## 2023-11-29 DIAGNOSIS — R29818 Other symptoms and signs involving the nervous system: Secondary | ICD-10-CM | POA: Diagnosis present

## 2023-11-29 DIAGNOSIS — I10 Essential (primary) hypertension: Secondary | ICD-10-CM | POA: Diagnosis not present

## 2023-11-29 DIAGNOSIS — R9089 Other abnormal findings on diagnostic imaging of central nervous system: Secondary | ICD-10-CM | POA: Diagnosis not present

## 2023-11-29 DIAGNOSIS — R410 Disorientation, unspecified: Secondary | ICD-10-CM | POA: Diagnosis not present

## 2023-11-29 DIAGNOSIS — Z7982 Long term (current) use of aspirin: Secondary | ICD-10-CM | POA: Diagnosis not present

## 2023-11-29 DIAGNOSIS — I110 Hypertensive heart disease with heart failure: Secondary | ICD-10-CM | POA: Insufficient documentation

## 2023-11-29 DIAGNOSIS — I5032 Chronic diastolic (congestive) heart failure: Secondary | ICD-10-CM | POA: Diagnosis not present

## 2023-11-29 DIAGNOSIS — R7989 Other specified abnormal findings of blood chemistry: Principal | ICD-10-CM

## 2023-11-29 DIAGNOSIS — I6523 Occlusion and stenosis of bilateral carotid arteries: Secondary | ICD-10-CM | POA: Diagnosis not present

## 2023-11-29 DIAGNOSIS — Z79899 Other long term (current) drug therapy: Secondary | ICD-10-CM | POA: Diagnosis not present

## 2023-11-29 DIAGNOSIS — E0865 Diabetes mellitus due to underlying condition with hyperglycemia: Secondary | ICD-10-CM

## 2023-11-29 DIAGNOSIS — I2510 Atherosclerotic heart disease of native coronary artery without angina pectoris: Secondary | ICD-10-CM

## 2023-11-29 DIAGNOSIS — R778 Other specified abnormalities of plasma proteins: Secondary | ICD-10-CM | POA: Diagnosis not present

## 2023-11-29 DIAGNOSIS — Z7984 Long term (current) use of oral hypoglycemic drugs: Secondary | ICD-10-CM | POA: Insufficient documentation

## 2023-11-29 DIAGNOSIS — G319 Degenerative disease of nervous system, unspecified: Secondary | ICD-10-CM | POA: Diagnosis not present

## 2023-11-29 DIAGNOSIS — I672 Cerebral atherosclerosis: Secondary | ICD-10-CM | POA: Diagnosis not present

## 2023-11-29 DIAGNOSIS — R4182 Altered mental status, unspecified: Secondary | ICD-10-CM | POA: Diagnosis not present

## 2023-11-29 DIAGNOSIS — R519 Headache, unspecified: Secondary | ICD-10-CM | POA: Diagnosis present

## 2023-11-29 LAB — I-STAT CHEM 8, ED
BUN: 19 mg/dL (ref 8–23)
Calcium, Ion: 1.11 mmol/L — ABNORMAL LOW (ref 1.15–1.40)
Chloride: 99 mmol/L (ref 98–111)
Creatinine, Ser: 0.9 mg/dL (ref 0.61–1.24)
Glucose, Bld: 214 mg/dL — ABNORMAL HIGH (ref 70–99)
HCT: 45 % (ref 39.0–52.0)
Hemoglobin: 15.3 g/dL (ref 13.0–17.0)
Potassium: 3.5 mmol/L (ref 3.5–5.1)
Sodium: 139 mmol/L (ref 135–145)
TCO2: 24 mmol/L (ref 22–32)

## 2023-11-29 LAB — URINALYSIS, ROUTINE W REFLEX MICROSCOPIC
Bacteria, UA: NONE SEEN
Bilirubin Urine: NEGATIVE
Glucose, UA: 150 mg/dL — AB
Ketones, ur: NEGATIVE mg/dL
Leukocytes,Ua: NEGATIVE
Nitrite: NEGATIVE
Protein, ur: 30 mg/dL — AB
Specific Gravity, Urine: 1.008 (ref 1.005–1.030)
pH: 7 (ref 5.0–8.0)

## 2023-11-29 LAB — PROTIME-INR
INR: 1 (ref 0.8–1.2)
Prothrombin Time: 13.7 s (ref 11.4–15.2)

## 2023-11-29 LAB — CBC WITH DIFFERENTIAL/PLATELET
Abs Immature Granulocytes: 0.01 10*3/uL (ref 0.00–0.07)
Basophils Absolute: 0.1 10*3/uL (ref 0.0–0.1)
Basophils Relative: 1 %
Eosinophils Absolute: 0.1 10*3/uL (ref 0.0–0.5)
Eosinophils Relative: 2 %
HCT: 44.2 % (ref 39.0–52.0)
Hemoglobin: 15.1 g/dL (ref 13.0–17.0)
Immature Granulocytes: 0 %
Lymphocytes Relative: 25 %
Lymphs Abs: 1.4 10*3/uL (ref 0.7–4.0)
MCH: 32 pg (ref 26.0–34.0)
MCHC: 34.2 g/dL (ref 30.0–36.0)
MCV: 93.6 fL (ref 80.0–100.0)
Monocytes Absolute: 0.4 10*3/uL (ref 0.1–1.0)
Monocytes Relative: 7 %
Neutro Abs: 3.6 10*3/uL (ref 1.7–7.7)
Neutrophils Relative %: 65 %
Platelets: 197 10*3/uL (ref 150–400)
RBC: 4.72 MIL/uL (ref 4.22–5.81)
RDW: 12.5 % (ref 11.5–15.5)
WBC: 5.5 10*3/uL (ref 4.0–10.5)
nRBC: 0 % (ref 0.0–0.2)

## 2023-11-29 LAB — COMPREHENSIVE METABOLIC PANEL WITH GFR
ALT: 32 U/L (ref 0–44)
AST: 36 U/L (ref 15–41)
Albumin: 4.2 g/dL (ref 3.5–5.0)
Alkaline Phosphatase: 90 U/L (ref 38–126)
Anion gap: 10 (ref 5–15)
BUN: 15 mg/dL (ref 8–23)
CO2: 25 mmol/L (ref 22–32)
Calcium: 9.1 mg/dL (ref 8.9–10.3)
Chloride: 102 mmol/L (ref 98–111)
Creatinine, Ser: 0.85 mg/dL (ref 0.61–1.24)
GFR, Estimated: >60 mL/min (ref >60)
Glucose, Bld: 213 mg/dL — ABNORMAL HIGH (ref 70–99)
Potassium: 3.5 mmol/L (ref 3.5–5.1)
Sodium: 137 mmol/L (ref 135–145)
Total Bilirubin: 0.9 mg/dL (ref 0.0–1.2)
Total Protein: 8 g/dL (ref 6.5–8.1)

## 2023-11-29 LAB — RAPID URINE DRUG SCREEN, HOSP PERFORMED
Amphetamines: NOT DETECTED
Barbiturates: NOT DETECTED
Benzodiazepines: NOT DETECTED
Cocaine: NOT DETECTED
Opiates: NOT DETECTED
Tetrahydrocannabinol: NOT DETECTED

## 2023-11-29 LAB — TROPONIN I (HIGH SENSITIVITY)
Troponin I (High Sensitivity): 53 ng/L — ABNORMAL HIGH (ref <18)
Troponin I (High Sensitivity): 789 ng/L (ref <18)

## 2023-11-29 LAB — MAGNESIUM: Magnesium: 2.1 mg/dL (ref 1.7–2.4)

## 2023-11-29 LAB — CBG MONITORING, ED: Glucose-Capillary: 205 mg/dL — ABNORMAL HIGH (ref 70–99)

## 2023-11-29 LAB — ETHANOL: Alcohol, Ethyl (B): <15 mg/dL (ref <15)

## 2023-11-29 LAB — APTT: aPTT: 31 s (ref 24–36)

## 2023-11-29 MED ORDER — TETRACAINE HCL 0.5 % OP SOLN
2.0000 [drp] | Freq: Once | OPHTHALMIC | Status: AC
  Administered 2023-11-29: 2 [drp] via OPHTHALMIC
  Filled 2023-11-29: qty 4

## 2023-11-29 MED ORDER — CARVEDILOL 12.5 MG PO TABS: 12.5000 mg | ORAL_TABLET | Freq: Two times a day (BID) | ORAL | Status: DC

## 2023-11-29 NOTE — ED Triage Notes (Addendum)
 Pt here for AMS that started at 4:15 today, usually axox4. Pt was axox2. Pt is now axox4 in triage. C/O left sided headache that started at 4pm. Denies blurred vision/numbness/tingling. C/O dizziness

## 2023-11-29 NOTE — ED Provider Notes (Signed)
 [place] [facility] Provider Note   CSN: [id number] Arrival date & time: 11/29/23  1658     History  Chief Complaint  Patient presents with   Altered Mental Status    Shawn Stephenson is a 89 y.o. male.  HPI Patient was watching a game show with his wife.  He normally has completely intact recall and suddenly could not recall some very specific details and information that normally would be no problem.  Within about 10 minutes of that he also experienced a left-sided headache.  Patient denies any other focal weakness numbness or ting in extremities.  He does not experience any dizziness or syncope.  Symptoms resolved and he is back to baseline.    Home Medications Prior to Admission medications   Medication Sig Start Date End Date Taking? Authorizing Provider  ACCU-CHEK AVIVA PLUS test strip  04/29/14   [provider]  ACCU-CHEK SOFTCLIX LANCETS lancets  04/29/14   [provider]  amLODipine  (NORVASC ) 10 MG tablet Take 1 tablet (10 mg total) by mouth daily. 05/28/21   [place], Peter M, MD  aspirin  81 MG chewable tablet Chew 1 tablet (81 mg total) by mouth daily. 12/12/17   Swinteck, Polly Brink, MD  atorvastatin  (LIPITOR) 10 MG tablet TAKE ONE-HALF TABLET BY MOUTH DAILY 08/31/23   [place], Peter M, MD  Blood Glucose Monitoring Suppl (ACCU-CHEK AVIVA PLUS) W/DEVICE KIT  04/29/14   [provider]  carvedilol  (COREG ) 6.25 MG tablet TAKE ONE TABLET BY MOUTH TWICE A DAY WITH A MEAL 11/16/23   [place], Peter M, MD  Cholecalciferol  (VITAMIN D-3) 1000 UNITS CAPS Take 1,000 Units by mouth daily.    [provider]  clotrimazole -betamethasone  (LOTRISONE ) cream Apply to affected area 2 times daily 10/13/20   Trent Frizzle, MD  Cyanocobalamin  (VITAMIN B 12 PO) Take 1,000 mcg by mouth daily.     [provider]  fexofenadine (ALLEGRA) 180 MG tablet Take 180 mg by mouth daily.    [provider]  glipiZIDE   (GLUCOTROL ) 5 MG 24 hr tablet Take 5 mg by mouth daily.    [provider]  latanoprost  (XALATAN ) 0.005 % ophthalmic solution Place 1 drop into the right eye daily. 10/21/17   [provider]  Lutein-Zeaxanthin 25-5 MG CAPS Take 1 capsule by mouth daily.    [provider]  MAGNESIUM  PO Take 400 mg by mouth daily.    [provider]  pyridOXINE  (VITAMIN B-6) 50 MG tablet Take 25 mg by mouth daily.     [provider]  tamsulosin  (FLOMAX ) 0.4 MG CAPS capsule TAKE ONE CAPSULE (0.4MG  TOTAL) BY MOUTH DAILY 06/09/23   Trent Frizzle, MD      Allergies    Enalapril maleate, Losartan , Menthol, Vasotec, Shellfish allergy, Sulfa antibiotics, and Sulfamethoxazole    Review of Systems   Review of Systems  Physical Exam Updated Vital Signs BP (!) 172/87   Pulse 64   Temp 98.1 F (36.7 C) (Oral)   Resp 18   Ht 5' 11 (1.803 m)   Wt 74.8 kg   SpO2 96%   BMI 23.01 kg/m  Physical Exam Constitutional:      Comments: Alert nontoxic well in appearance.  HENT:     Head: Normocephalic and atraumatic.     Nose: Nose normal.     Mouth/Throat:     Mouth: Mucous membranes are moist.     Pharynx: Oropharynx is clear.   Eyes:  Extraocular Movements: Extraocular movements intact.     Conjunctiva/sclera: Conjunctivae normal.     Pupils: Pupils are equal, round, and reactive to light.     Comments: Tono-Pen ocular pressures Right: 27,23 Left: 23, 23   Cardiovascular:     Rate and Rhythm: Normal rate and regular rhythm.  Pulmonary:     Effort: Pulmonary effort is normal.     Breath sounds: Normal breath sounds.  Abdominal:     General: There is no distension.     Palpations: Abdomen is soft.     Tenderness: There is no abdominal tenderness. There is no guarding.   Musculoskeletal:        General: Normal range of motion.     Cervical back: Neck supple.     Comments: The patient has trace edema bilateral lower extremities, slightly greater  left than right.  Skin changes consistent with chronic venous stasis.  Calves soft and pliable.   Skin:    General: Skin is warm and dry.   Neurological:     General: No focal deficit present.     Mental Status: He is oriented to person, place, and time.     Cranial Nerves: No cranial nerve deficit.     Motor: No weakness.     Coordination: Coordination normal.     Comments: Cranial nerve II through XII intact.  Speech clear, normal content, normal cognitive function.  No aphasia.  Finger-nose exam intact bilaterally.  Motor strength 5\5 upper and lower extremities.  No visual field cuts  Psychiatric:        Mood and Affect: Mood normal.     ED Results / Procedures / Treatments   Labs (all labs ordered are listed, but only abnormal results are displayed) Labs Reviewed  COMPREHENSIVE METABOLIC PANEL WITH GFR - Abnormal; Notable for the following components:      Result Value   Glucose, Bld 213 (*)    All other components within normal limits  URINALYSIS, ROUTINE W REFLEX MICROSCOPIC - Abnormal; Notable for the following components:   Color, Urine STRAW (*)    Glucose, UA 150 (*)    Hgb urine dipstick SMALL (*)    Protein, ur 30 (*)    All other components within normal limits  CBG MONITORING, ED - Abnormal; Notable for the following components:   Glucose-Capillary 205 (*)    All other components within normal limits  I-STAT CHEM 8, ED - Abnormal; Notable for the following components:   Glucose, Bld 214 (*)    Calcium , Ion 1.11 (*)    All other components within normal limits  TROPONIN I (HIGH SENSITIVITY) - Abnormal; Notable for the following components:   Troponin I (High Sensitivity) 53 (*)    All other components within normal limits  CBC WITH DIFFERENTIAL/PLATELET  MAGNESIUM   ETHANOL  PROTIME-INR  APTT  RAPID URINE DRUG SCREEN, HOSP PERFORMED  TROPONIN I (HIGH SENSITIVITY)    EKG EKG Interpretation Date/Time:  Wednesday November 29 2023 17:24:48 EDT Ventricular  Rate:  84 PR Interval:  194 QRS Duration:  146 QT Interval:  426 QTC Calculation: 503 R Axis:   -73  Text Interpretation: Normal sinus rhythm Right bundle branch block When compared with ECG of 09-Dec-2017 15:28, PREVIOUS ECG IS PRESENT increased IVCD compared to prtrevious Confirmed by Wynetta Heckle [id number]) on 11/29/2023 5:44:58 PM  Radiology MR BRAIN WO CONTRAST Result Date: 11/29/2023 CLINICAL DATA:  Initial evaluation for acute neuro deficit, stroke suspected. EXAM: MRI HEAD WITHOUT CONTRAST TECHNIQUE:  Multiplanar, multiecho pulse sequences of the brain and surrounding structures were obtained without intravenous contrast. COMPARISON:  CT from earlier the same day. FINDINGS: Brain: Diffuse prominence of the CSF containing spaces compatible generalized cerebral atrophy. Patchy T2/FLAIR hyperintensity involving the periventricular and deep white matter both cerebral hemispheres as well as the pons, consistent chronic small vessel ischemic disease, mild to moderate for age. No evidence for acute or subacute infarct. No areas of chronic cortical infarction. No acute or chronic intracranial blood products. No mass lesion, midline shift or mass effect. Ventricular prominence related to global parenchymal volume loss without hydrocephalus. No extra-axial fluid collection. Pituitary gland within normal limits. Vascular: Major intracranial vascular flow voids are maintained. Skull and upper cervical spine: Craniocervical junction within normal limits. Bone marrow signal intensity normal. No scalp soft tissue abnormality. Sinuses/Orbits: Prior bilateral ocular lens replacement. Paranasal sinuses are largely clear. No significant mastoid effusion. Other: None. IMPRESSION: 1. No acute intracranial abnormality. 2. Generalized age-related cerebral atrophy with mild to moderate chronic small vessel ischemic disease. Electronically Signed   By: Virgia Griffins M.D.   On: 11/29/2023 20:36   CT HEAD WO  CONTRAST Result Date: 11/29/2023 CLINICAL DATA:  Altered mental status EXAM: CT HEAD WITHOUT CONTRAST TECHNIQUE: Contiguous axial images were obtained from the base of the skull through the vertex without intravenous contrast. RADIATION DOSE REDUCTION: This exam was performed according to the departmental dose-optimization program which includes automated exposure control, adjustment of the mA and/or kV according to patient size and/or use of iterative reconstruction technique. COMPARISON:  12/09/2017 FINDINGS: Brain: There is no mass, hemorrhage or extra-axial collection. There is generalized atrophy without lobar predilection. Hypodensity of the white matter is most commonly associated with chronic microvascular disease. Vascular: Atherosclerotic calcification of the vertebral and internal carotid arteries at the skull base. No abnormal hyperdensity of the major intracranial arteries or dural venous sinuses. Skull: The visualized skull base, calvarium and extracranial soft tissues are normal. Sinuses/Orbits: No fluid levels or advanced mucosal thickening of the visualized paranasal sinuses. No mastoid or middle ear effusion. Normal orbits. Other: None. IMPRESSION: 1. No acute intracranial abnormality. 2. Generalized atrophy and findings of chronic microvascular disease. Electronically Signed   By: Juanetta Nordmann M.D.   On: 11/29/2023 18:37    Procedures Procedures   CRITICAL CARE Performed by: Wynetta Heckle   Total critical care time: 45 minutes  Critical care time was exclusive of separately billable procedures and treating other patients.  Critical care was necessary to treat or prevent imminent or life-threatening deterioration.  Critical care was time spent personally by me on the following activities: development of treatment plan with patient and/or surrogate as well as nursing, discussions with consultants, evaluation of patient's response to treatment, examination of patient, obtaining  history from patient or surrogate, ordering and performing treatments and interventions, ordering and review of laboratory studies, ordering and review of radiographic studies, pulse oximetry and re-evaluation of patient's condition.  Medications Ordered in ED Medications  carvedilol  (COREG ) tablet 12.5 mg (has no administration in time range)  tetracaine  (PONTOCAINE) 0.5 % ophthalmic solution 2 drop (2 drops Both Eyes Given 11/29/23 1934)    ED Course/ Medical Decision Making/ A&P                                 Medical Decision Making Amount and/or Complexity of Data Reviewed Labs: ordered. Radiology: ordered.  Risk Prescription drug management. Decision regarding hospitalization.  Patient presents as outlined.  He experienced an episode of some recall problems while watching a favorite program.  At that time he was aware that he was having difficulty finding specific words.  There was no visual change or loss, no facial droop, no extremity motor dysfunction no gait dysfunction.  Patient reports that the symptoms did resolve.  Patient orts that he did noted that time he had somewhat of a left-sided pressure-like headache.  Patient denies any history of headaches.  He reports the headache was not severe or sudden but uncomfortable and atypical for him.  He has been well recently without other medical problems.  Patient is not anticoagulated.  Will proceed with stroke evaluation.  At this time no residual symptoms, does not meet code stroke criteria  19: 16 reviewed with Dr. Bonnita Buttner neurology.  Recommends MRI and consider CTA after MRI for patient's complaint of left-sided headache.  If workup negative, and no symptoms, appropriate for follow-up outpatient.  MRI does not show any acute stroke areas.  Patient does note that his blood pressures have been higher than normal this week.  Patient does note that his amlodipine  dose was cut back from 10 mg to 5 mg within about 1 to 2 weeks ago  because he had noted some swelling in his left leg to his doctor.  He does believe the swelling is gone down somewhat but not resolved completely.  On examination, patient has changes of chronic venous stasis bilateral lower extremities with significant skin thinning and color change.  There is probably subtle difference in edema on the left.  Patient does also have a report having had a fairly large hematoma or contusion to the upper portion of that leg sometime ago and the swelling never really has been even since then.  At this time I do not have high suspicion for DVT but will have the patient return for outpatient DVT study.  Given the patient's blood pressure is higher than baseline and at the 180s over 90s, I will have the patient increase his baseline carvedilol  dose from 6.25-12.5.  He has been counseled on monitoring his response of this change at home.  If the patient's blood pressure had been higher at home possibly he was experiencing some hypertensive symptoms.  Currently he does not have headache or symptoms.  Careful return precautions have been reviewed and we have also discussed follow-up plan for neurology, ophthalmology and PCP.  Second troponin returned significantly elevated.  At this time that is the anticipated finding.  Will proceed with additional diagnostic workup and plan for admission.  Patient's mental status remained stable.  Patient never experienced any chest pain or dyspnea.  Consult: Cardiology fellow Dr. Vira Grieves Consult: [facility] hospitalist for admission dr. Brice Campi.         Final Clinical Impression(s) / ED Diagnoses Final diagnoses:  Elevated troponin  Confusion  Hypertension, unspecified type    Rx / DC Orders ED Discharge Orders     None         Wynetta Heckle, MD 12/08/23 [phone]

## 2023-11-29 NOTE — ED Notes (Signed)
 Pt ambulated to restroom with minimal assistance.

## 2023-11-29 NOTE — Consult Note (Addendum)
 Cardiology Consultation   Patient ID: GREENE DIODATO MRN: [id number]; DOB: June 25, 1929  Admit date: 11/29/2023 Date of Consult: 11/29/2023  PCP:  Artemisa Bile, MD   [facility] Providers Cardiologist:  None        Patient Profile: CALIPH BOROWIAK is a 89 y.o. male with a hx of HTN, HLD, DM2, frequent PVCs, and prior right hip fracture who is being seen 11/29/2023 for the evaluation of elevated troponins at the request of Dr. Daivd Dub.  History of Present Illness: Mr. Abernathy presented to the ED today for symptoms of acute onset altered mental status.  At approximately 2 PM the patient was sitting at home watching TV with his wife when he developed word finding difficulty, slurred speech, and altered mental status.  He also developed left eye pain without associated vision changes.  He denies weakness, numbness, facial droop, syncope, presyncope, or headache.  He further denies chest pain, SOB, PND, orthopnea, palpitations, GI symptoms. Upon further probing, he has noted DOE for the past 6 months. Given the sudden change in mental status he was brought to the ED for evaluation.  He underwent stat head CT which showed no acute intracranial abnormalities.  A brain MRI similarly was negative for CVA but showed generalized age-related cerebral atrophy with mild to moderate chronic small vessel ischemic disease.  A CT PE was negative for PE but did show multivessel coronary calcifications.  His altered mental status has since resolved since being in the hospital.   He lives at home with his wife.  He denies tobacco, alcohol, illicit drug use.  He has no family history of CAD.  In the ED his VS were afebrile, HR 84, BP 180/91, RR 16, satting 96% on RA.  Labs notable for troponin 53 -> 789.  ECG showed NSR, RBBB, and LVH without acute ischemic changes.  The patient denies ever having any chest pain or SOB.  Cardiology consulted for evaluation of his elevated troponin.   Past Medical  History:  Diagnosis Date   Asymptomatic PVCs    Diabetes mellitus    well controlled   Diastolic dysfunction    Hyperlipidemia    Hypertension    Mitral regurgitation    mild    Past Surgical History:  Procedure Laterality Date   INTRAMEDULLARY (IM) NAIL INTERTROCHANTERIC Right 12/09/2017   Procedure: INTRAMEDULLARY (IM) NAIL INTERTROCHANTRIC;  Surgeon: Adonica Hoose, MD;  Location: [facility] OR;  Service: Orthopedics;  Laterality: Right;     Home Medications:  Prior to Admission medications   Medication Sig Start Date End Date Taking? Authorizing Provider  amLODipine  (NORVASC ) 10 MG tablet Take 1 tablet (10 mg total) by mouth daily. 05/28/21  Yes [place], Peter M, MD  aspirin  81 MG chewable tablet Chew 1 tablet (81 mg total) by mouth daily. 12/12/17  Yes [place], Polly Brink, MD  atorvastatin  (LIPITOR) 10 MG tablet TAKE ONE-HALF TABLET BY MOUTH DAILY 08/31/23  Yes [place], Peter M, MD  carvedilol  (COREG ) 6.25 MG tablet TAKE ONE TABLET BY MOUTH TWICE A DAY WITH A MEAL 11/16/23  Yes [place], Peter M, MD  Cholecalciferol  (VITAMIN D-3) 1000 UNITS CAPS Take 1,000 Units by mouth daily.   Yes [provider]  Cyanocobalamin  (VITAMIN B 12 PO) Take 1,000 mcg by mouth daily.    Yes [provider]  fexofenadine (ALLEGRA) 180 MG tablet Take 180 mg by mouth daily.   Yes [provider]  glipiZIDE  (GLUCOTROL ) 5 MG 24 hr tablet Take 5 mg by mouth daily.  Yes [provider]  MAGNESIUM  PO Take 400 mg by mouth daily.   Yes [provider]  naproxen sodium (ALEVE) 220 MG tablet Take 220 mg by mouth daily as needed (for pain).   Yes [provider]  pyridOXINE  (VITAMIN B6) 25 MG tablet Take 25 mg by mouth daily.   Yes [provider]  tamsulosin  (FLOMAX ) 0.4 MG CAPS capsule TAKE ONE CAPSULE (0.4MG  TOTAL) BY MOUTH DAILY 06/09/23  Yes Trent Frizzle, MD  ACCU-CHEK AVIVA PLUS test strip  04/29/14   [provider]  ACCU-CHEK SOFTCLIX LANCETS  lancets  04/29/14   [provider]  Blood Glucose Monitoring Suppl (ACCU-CHEK AVIVA PLUS) W/DEVICE KIT  04/29/14   [provider]    Scheduled Meds:  Continuous Infusions:  PRN Meds:   Allergies:    Allergies  Allergen Reactions   Enalapril Maleate Other (See Comments)    swelling   Losartan  Other (See Comments)    Makes him feel jittery   Menthol Itching   Vasotec Swelling   Shellfish Allergy    Sulfa Antibiotics Other (See Comments)    Doesn't recall reaction   Sulfamethoxazole Other (See Comments)    Doesn't recall reaction    Social History:   Social History   Socioeconomic History   Marital status: Married    Spouse name: Not on file   Number of children: Not on file   Years of education: Not on file   Highest education level: Not on file  Occupational History   Not on file  Tobacco Use   Smoking status: Never   Smokeless tobacco: Never  Vaping Use   Vaping status: Never Used  Substance and Sexual Activity   Alcohol use: No   Drug use: No   Sexual activity: Not on file  Other Topics Concern   Not on file  Social History Narrative   Not on file   Social Drivers of Health   Financial Resource Strain: Not on file  Food Insecurity: Not on file  Transportation Needs: Not on file  Physical Activity: Not on file  Stress: Not on file  Social Connections: Not on file  Intimate Partner Violence: Not on file    Family History:    Family History  Problem Relation Age of Onset   Stroke Father    Diabetes Father    Hypertension Father    Hypertension Mother    Arthritis Mother    Hypertension Brother    Stroke Brother    Kidney disease Brother      ROS:  Please see the history of present illness.  All other ROS reviewed and negative.     Physical Exam/Data: Vitals:   11/29/23 1945 11/29/23 2200 11/29/23 2230 11/29/23 2321  BP: (!) 185/90 (!) 165/87 (!) 172/87   Pulse: 67 68 64   Resp: 16 15 18    Temp:    98.4 F (36.9  C)  TempSrc:    Oral  SpO2: 98% 98% 96%   Weight:      Height:       No intake or output data in the 24 hours ending 11/29/23 2356    11/29/2023    5:11 PM 05/28/2021    3:31 PM 12/29/2020    2:33 PM  Last 3 Weights  Weight (lbs) 165 lb 171 lb 3.2 oz 157 lb 3.2 oz  Weight (kg) 74.844 kg 77.656 kg 71.305 kg     Body mass index is 23.01 kg/m.  General:  Well nourished, well developed, in no acute distress, very pleasant HEENT: normal, atraumatic, normocephalic Neck: no JVD Vascular: Bilateral carotid bruits vs radiation murmur to carotids; radial pulses 2+ bilaterally Cardiac:  normal S1, S2; RRR; II/VI systolic murmur heard throughout the precordium but loudest at the RUSB and apex, no rubs or gallops Lungs:  clear to auscultation bilaterally, no wheezing, rhonchi or rales  Abd: soft, nontender, nondistended Ext: Trace pitting edema bilaterally L>R Musculoskeletal:  No deformities, BUE and BLE strength normal and equal Skin: warm and dry  Neuro:  CNs 2-12 intact, no gross focal deficit Psych:  Normal affect   EKG:  The EKG was personally reviewed and demonstrates: NSR, RBBB, LVH      Telemetry:  Telemetry was personally reviewed and demonstrates: NSR  Relevant CV Studies:  TTE 12/10/17:  Study Conclusions   - Left ventricle: The cavity size was normal. There was moderate    focal basal hypertrophy of the septum. Systolic function was    vigorous. The estimated ejection fraction was in the range of 65%    to 70%. Wall motion was normal; there were no regional wall    motion abnormalities. Doppler parameters are consistent with    abnormal left ventricular relaxation (grade 1 diastolic    dysfunction).  - Aortic valve: Trileaflet; mildly thickened, mildly calcified    leaflets. There was mild regurgitation.   Laboratory Data: High Sensitivity Troponin:   Recent Labs  Lab 11/29/23 1746 11/29/23 2210  TROPONINIHS 53* 789*     Chemistry Recent Labs  Lab  11/29/23 1746 11/29/23 1812  NA 137 139  K 3.5 3.5  CL 102 99  CO2 25  --   GLUCOSE 213* 214*  BUN 15 19  CREATININE 0.85 0.90  CALCIUM  9.1  --   MG 2.1  --   GFRNONAA >60  --   ANIONGAP 10  --     Recent Labs  Lab 11/29/23 1746  PROT 8.0  ALBUMIN 4.2  AST 36  ALT 32  ALKPHOS 90  BILITOT 0.9   Lipids No results for input(s): CHOL, TRIG, HDL, LABVLDL, LDLCALC, CHOLHDL in the last 168 hours.  Hematology Recent Labs  Lab 11/29/23 1746 11/29/23 1812  WBC 5.5  --   RBC 4.72  --   HGB 15.1 15.3  HCT 44.2 45.0  MCV 93.6  --   MCH 32.0  --   MCHC 34.2  --   RDW 12.5  --   PLT 197  --    Thyroid No results for input(s): TSH, FREET4 in the last 168 hours.  BNPNo results for input(s): BNP, PROBNP in the last 168 hours.  DDimer No results for input(s): DDIMER in the last 168 hours.  Radiology/Studies:  MR BRAIN WO CONTRAST Result Date: 11/29/2023 CLINICAL DATA:  Initial evaluation for acute neuro deficit, stroke suspected. EXAM: MRI HEAD WITHOUT CONTRAST TECHNIQUE: Multiplanar, multiecho pulse sequences of the brain and surrounding structures were obtained without intravenous contrast. COMPARISON:  CT from earlier the same day. FINDINGS: Brain: Diffuse prominence of the CSF containing spaces compatible generalized cerebral atrophy. Patchy T2/FLAIR hyperintensity involving the periventricular and deep white matter both cerebral hemispheres as well as the pons, consistent chronic small vessel ischemic disease, mild to moderate for age. No evidence for acute or subacute infarct. No areas of chronic cortical infarction. No acute or chronic intracranial blood products. No mass lesion, midline shift or mass effect. Ventricular prominence related to global parenchymal volume loss without hydrocephalus. No  extra-axial fluid collection. Pituitary gland within normal limits. Vascular: Major intracranial vascular flow voids are maintained. Skull and upper cervical spine:  Craniocervical junction within normal limits. Bone marrow signal intensity normal. No scalp soft tissue abnormality. Sinuses/Orbits: Prior bilateral ocular lens replacement. Paranasal sinuses are largely clear. No significant mastoid effusion. Other: None. IMPRESSION: 1. No acute intracranial abnormality. 2. Generalized age-related cerebral atrophy with mild to moderate chronic small vessel ischemic disease. Electronically Signed   By: Virgia Griffins M.D.   On: 11/29/2023 20:36   CT HEAD WO CONTRAST Result Date: 11/29/2023 CLINICAL DATA:  Altered mental status EXAM: CT HEAD WITHOUT CONTRAST TECHNIQUE: Contiguous axial images were obtained from the base of the skull through the vertex without intravenous contrast. RADIATION DOSE REDUCTION: This exam was performed according to the departmental dose-optimization program which includes automated exposure control, adjustment of the mA and/or kV according to patient size and/or use of iterative reconstruction technique. COMPARISON:  12/09/2017 FINDINGS: Brain: There is no mass, hemorrhage or extra-axial collection. There is generalized atrophy without lobar predilection. Hypodensity of the white matter is most commonly associated with chronic microvascular disease. Vascular: Atherosclerotic calcification of the vertebral and internal carotid arteries at the skull base. No abnormal hyperdensity of the major intracranial arteries or dural venous sinuses. Skull: The visualized skull base, calvarium and extracranial soft tissues are normal. Sinuses/Orbits: No fluid levels or advanced mucosal thickening of the visualized paranasal sinuses. No mastoid or middle ear effusion. Normal orbits. Other: None. IMPRESSION: 1. No acute intracranial abnormality. 2. Generalized atrophy and findings of chronic microvascular disease. Electronically Signed   By: Juanetta Nordmann M.D.   On: 11/29/2023 18:37     Assessment and Plan: SHELTON SQUARE is a 89 y.o. male with a hx of  HTN, HLD, DM2, frequent PVCs, and prior right hip fracture who is being seen 11/29/2023 for the evaluation of elevated troponins at the request of Dr. Daivd Dub.  #Elevated Troponins :: Patient presented with the chief complaint of altered mental status and was incidentally noted to have an elevated troponin.  He denies having any cardiopulmonary symptoms and his ECG is similar to prior.  I performed a bedside POCUS which appears to show preserved contractility.  His CT PE demonstrates coronary calcifications and was negative for PE.  It is unclear to me at this point why his troponin is elevated, but seems inconsistent with ACS.  I also cannot tie his troponins to his altered mental status episode either.  Given the degree of troponin elevation however, I think it is reasonable to get an echo and a coronary CTA for further risk stratification. - Complete echo - Coronary CTA - Lipid panel - Repeat troponin to assess trend - s/p aspirin  load  Addendum: Initial plan was to hold heparin in light of the unclear nature of this patient's elevated troponins.  Since his third troponin continues to rise, I will start heparin as a precautionary measure.  Remains chest pain-free.  #AMS c/f TIA #Cerebral Vascular Disease #Carotid Bruits #HLD :: He had transient AMS with now complete resolution of symptoms.  Head imaging reveals ischemic vascular changes which are chronic.  I also noted carotid bruits on physical exam.  He is not currently on an aspirin .  I will check a lipid panel, carotid ultrasound and start aspirin . - Will defer additional neurologic evaluation to neurology as needed - Lipid panel - Start baby aspirin  -Continue home atorvastatin  10 mg daily with potential up titration - Bilateral carotid ultrasound  #HTN -  Continue home antihypertensive regimen   Risk Assessment/Risk Scores:              For questions or updates, please contact [facility] Please consult  [email or web address] for contact info under    Signed, Christena Covert, MD  11/29/2023 11:56 PM

## 2023-11-29 NOTE — ED Notes (Signed)
 Patient transported to MRI

## 2023-11-29 NOTE — ED Provider Triage Note (Signed)
 Emergency Medicine Provider Triage Evaluation Note  Shawn Stephenson , a 89 y.o. male  was evaluated in triage.  Pt complains of altered mental status.  His wife reports that about 2 hours ago he was watching his favorite show and cannot recall who the next guest was on the show which he always knows.  He reports that at that time he was having difficulty finding words.  At this time he is alert and oriented x 3.  Also endorsing a left-sided headache but no visual disturbance.  Denies focal weakness or numbness in extremities.  Denies pain shortness of breath.  Review of Systems  Positive: See above Negative: See above  Physical Exam  BP (!) 180/91 (BP Location: Left Arm)   Pulse 84   Temp 98 F (36.7 C)   Resp 16   Ht 5' 11 (1.803 m)   Wt 74.8 kg   SpO2 96%   BMI 23.01 kg/m  Gen:   Awake, no distress   Resp:  Normal effort  MSK:   Moves extremities without difficulty  Other:    Medical Decision Making  Medically screening exam initiated at 5:51 PM.  Appropriate orders placed.  Shawn Stephenson was informed that the remainder of the evaluation will be completed by another provider, this initial triage assessment does not replace that evaluation, and the importance of remaining in the ED until their evaluation is complete.  Work up started   Janalee Mcmurray, PA-C 11/29/23 1752

## 2023-11-30 ENCOUNTER — Observation Stay (HOSPITAL_COMMUNITY)

## 2023-11-30 ENCOUNTER — Encounter (HOSPITAL_COMMUNITY): Payer: Self-pay | Admitting: Family Medicine

## 2023-11-30 DIAGNOSIS — R29818 Other symptoms and signs involving the nervous system: Secondary | ICD-10-CM | POA: Diagnosis present

## 2023-11-30 DIAGNOSIS — I10 Essential (primary) hypertension: Secondary | ICD-10-CM

## 2023-11-30 DIAGNOSIS — E785 Hyperlipidemia, unspecified: Secondary | ICD-10-CM | POA: Diagnosis not present

## 2023-11-30 DIAGNOSIS — R7989 Other specified abnormal findings of blood chemistry: Secondary | ICD-10-CM | POA: Diagnosis not present

## 2023-11-30 DIAGNOSIS — I6523 Occlusion and stenosis of bilateral carotid arteries: Secondary | ICD-10-CM | POA: Diagnosis not present

## 2023-11-30 DIAGNOSIS — I700 Atherosclerosis of aorta: Secondary | ICD-10-CM | POA: Diagnosis not present

## 2023-11-30 DIAGNOSIS — I422 Other hypertrophic cardiomyopathy: Secondary | ICD-10-CM | POA: Diagnosis not present

## 2023-11-30 DIAGNOSIS — R0602 Shortness of breath: Secondary | ICD-10-CM | POA: Diagnosis not present

## 2023-11-30 DIAGNOSIS — I517 Cardiomegaly: Secondary | ICD-10-CM | POA: Diagnosis not present

## 2023-11-30 LAB — BASIC METABOLIC PANEL WITH GFR
Anion gap: 10 (ref 5–15)
BUN: 13 mg/dL (ref 8–23)
CO2: 24 mmol/L (ref 22–32)
Calcium: 8.9 mg/dL (ref 8.9–10.3)
Chloride: 102 mmol/L (ref 98–111)
Creatinine, Ser: 0.8 mg/dL (ref 0.61–1.24)
GFR, Estimated: >60 mL/min (ref >60)
Glucose, Bld: 131 mg/dL — ABNORMAL HIGH (ref 70–99)
Potassium: 3.3 mmol/L — ABNORMAL LOW (ref 3.5–5.1)
Sodium: 136 mmol/L (ref 135–145)

## 2023-11-30 LAB — GLUCOSE, CAPILLARY
Glucose-Capillary: 212 mg/dL — ABNORMAL HIGH (ref 70–99)
Glucose-Capillary: 196 mg/dL — ABNORMAL HIGH (ref 70–99)

## 2023-11-30 LAB — ECHOCARDIOGRAM COMPLETE
AR max vel: 2.33 cm2
AV Area VTI: 2.57 cm2
AV Area mean vel: 2.31 cm2
AV Mean grad: 5 mmHg
AV Peak grad: 10.1 mmHg
Ao pk vel: 1.59 m/s
Area-P 1/2: 2.48 cm2
Calc EF: 56.3 %
Height: 71 in
S' Lateral: 3 cm
Single Plane A2C EF: 54.2 %
Single Plane A4C EF: 57.7 %
Weight: 2640 [oz_av]

## 2023-11-30 LAB — CBC
HCT: 41.3 % (ref 39.0–52.0)
Hemoglobin: 13.8 g/dL (ref 13.0–17.0)
MCH: 31.4 pg (ref 26.0–34.0)
MCHC: 33.4 g/dL (ref 30.0–36.0)
MCV: 94.1 fL (ref 80.0–100.0)
Platelets: 155 10*3/uL (ref 150–400)
RBC: 4.39 MIL/uL (ref 4.22–5.81)
RDW: 12.4 % (ref 11.5–15.5)
WBC: 5.2 10*3/uL (ref 4.0–10.5)
nRBC: 0 % (ref 0.0–0.2)

## 2023-11-30 LAB — LIPID PANEL
Cholesterol: 139 mg/dL (ref 0–200)
HDL: 46 mg/dL (ref >40)
LDL Cholesterol: 82 mg/dL (ref 0–99)
Total CHOL/HDL Ratio: 3 ratio
Triglycerides: 57 mg/dL (ref <150)
VLDL: 11 mg/dL (ref 0–40)

## 2023-11-30 LAB — TROPONIN I (HIGH SENSITIVITY): Troponin I (High Sensitivity): 1143 ng/L (ref <18)

## 2023-11-30 LAB — MAGNESIUM: Magnesium: 1.9 mg/dL (ref 1.7–2.4)

## 2023-11-30 LAB — CBG MONITORING, ED
Glucose-Capillary: 142 mg/dL — ABNORMAL HIGH (ref 70–99)
Glucose-Capillary: 143 mg/dL — ABNORMAL HIGH (ref 70–99)

## 2023-11-30 LAB — HEPARIN LEVEL (UNFRACTIONATED): Heparin Unfractionated: 0.15 [IU]/mL — ABNORMAL LOW (ref 0.30–0.70)

## 2023-11-30 LAB — HEMOGLOBIN A1C
Hgb A1c MFr Bld: 7.9 % — ABNORMAL HIGH (ref 4.8–5.6)
Mean Plasma Glucose: 180.03 mg/dL

## 2023-11-30 MED ORDER — TAMSULOSIN HCL 0.4 MG PO CAPS
0.4000 mg | ORAL_CAPSULE | Freq: Every day | ORAL | Status: DC
  Administered 2023-11-30 – 2023-12-01 (×2): 0.4 mg via ORAL
  Filled 2023-11-30 (×2): qty 1

## 2023-11-30 MED ORDER — IOHEXOL 350 MG/ML SOLN
75.0000 mL | Freq: Once | INTRAVENOUS | Status: AC | PRN
  Administered 2023-11-30: 75 mL via INTRAVENOUS

## 2023-11-30 MED ORDER — ASPIRIN 81 MG PO CHEW
81.0000 mg | CHEWABLE_TABLET | Freq: Every day | ORAL | Status: DC
  Filled 2023-11-30: qty 1

## 2023-11-30 MED ORDER — CARVEDILOL 6.25 MG PO TABS
6.2500 mg | ORAL_TABLET | Freq: Two times a day (BID) | ORAL | Status: DC
  Administered 2023-11-30 – 2023-12-01 (×3): 6.25 mg via ORAL
  Filled 2023-11-30 (×2): qty 1
  Filled 2023-11-30: qty 2

## 2023-11-30 MED ORDER — POLYETHYLENE GLYCOL 3350 17 G PO PACK: 17.0000 g | PACK | Freq: Every day | ORAL | Status: DC | PRN

## 2023-11-30 MED ORDER — AMLODIPINE BESYLATE 5 MG PO TABS
5.0000 mg | ORAL_TABLET | Freq: Every day | ORAL | Status: DC
  Administered 2023-11-30 – 2023-12-01 (×2): 5 mg via ORAL
  Filled 2023-11-30 (×2): qty 1

## 2023-11-30 MED ORDER — INSULIN ASPART 100 UNIT/ML IJ SOLN: 0.0000 [IU] | Freq: Every day | INTRAMUSCULAR | Status: DC

## 2023-11-30 MED ORDER — INSULIN ASPART 100 UNIT/ML IJ SOLN
0.0000 [IU] | Freq: Three times a day (TID) | INTRAMUSCULAR | Status: DC
  Administered 2023-11-30: 2 [IU] via SUBCUTANEOUS
  Administered 2023-12-01: 1 [IU] via SUBCUTANEOUS

## 2023-11-30 MED ORDER — HEPARIN BOLUS VIA INFUSION
4000.0000 [IU] | Freq: Once | INTRAVENOUS | Status: AC
  Administered 2023-11-30: 4000 [IU] via INTRAVENOUS
  Filled 2023-11-30: qty 4000

## 2023-11-30 MED ORDER — HEPARIN (PORCINE) 25000 UT/250ML-% IV SOLN
1100.0000 [IU]/h | INTRAVENOUS | Status: DC
  Administered 2023-11-30: 900 [IU]/h via INTRAVENOUS
  Filled 2023-11-30 (×2): qty 250

## 2023-11-30 MED ORDER — ASPIRIN 325 MG PO TABS
325.0000 mg | ORAL_TABLET | Freq: Once | ORAL | Status: AC
  Administered 2023-11-30: 325 mg via ORAL
  Filled 2023-11-30: qty 1

## 2023-11-30 MED ORDER — POTASSIUM CHLORIDE CRYS ER 20 MEQ PO TBCR
20.0000 meq | EXTENDED_RELEASE_TABLET | Freq: Two times a day (BID) | ORAL | Status: AC
  Administered 2023-11-30 (×2): 20 meq via ORAL
  Filled 2023-11-30 (×2): qty 1

## 2023-11-30 MED ORDER — ENOXAPARIN SODIUM 40 MG/0.4ML IJ SOSY
40.0000 mg | PREFILLED_SYRINGE | Freq: Every day | INTRAMUSCULAR | Status: DC
Start: 2023-11-30 — End: 2023-11-30

## 2023-11-30 MED ORDER — POTASSIUM CHLORIDE CRYS ER 20 MEQ PO TBCR
40.0000 meq | EXTENDED_RELEASE_TABLET | Freq: Once | ORAL | Status: AC
  Administered 2023-11-30: 40 meq via ORAL
  Filled 2023-11-30: qty 2

## 2023-11-30 MED ORDER — ATORVASTATIN CALCIUM 10 MG PO TABS
5.0000 mg | ORAL_TABLET | Freq: Every day | ORAL | Status: DC
  Administered 2023-11-30 – 2023-12-01 (×2): 5 mg via ORAL
  Filled 2023-11-30 (×2): qty 1

## 2023-11-30 MED ORDER — SODIUM CHLORIDE 0.9% FLUSH
3.0000 mL | Freq: Two times a day (BID) | INTRAVENOUS | Status: DC
  Administered 2023-11-30 – 2023-12-01 (×2): 3 mL via INTRAVENOUS

## 2023-11-30 MED ORDER — ACETAMINOPHEN 325 MG PO TABS
650.0000 mg | ORAL_TABLET | Freq: Four times a day (QID) | ORAL | Status: DC | PRN
  Administered 2023-12-01: 650 mg via ORAL
  Filled 2023-11-30: qty 2

## 2023-11-30 MED ORDER — HEPARIN BOLUS VIA INFUSION
1000.0000 [IU] | Freq: Once | INTRAVENOUS | Status: AC
  Administered 2023-11-30: 1000 [IU] via INTRAVENOUS
  Filled 2023-11-30: qty 1000

## 2023-11-30 MED ORDER — ACETAMINOPHEN 650 MG RE SUPP: 650.0000 mg | Freq: Four times a day (QID) | RECTAL | Status: DC | PRN

## 2023-11-30 NOTE — Progress Notes (Signed)
 PHARMACY - ANTICOAGULATION CONSULT NOTE  Pharmacy Consult for heparin Indication: chest pain/ACS  Allergies  Allergen Reactions   Enalapril Maleate Other (See Comments)    Swelling **Vasotec**   Losartan  Other (See Comments)    Makes him feel jittery   Menthol Itching   Shellfish Allergy    Sulfa Antibiotics Other (See Comments)    Doesn't recall reaction   Sulfamethoxazole Other (See Comments)    Doesn't recall reaction    Patient Measurements: Height: 5' 11 (180.3 cm) Weight: 74.8 kg (165 lb) IBW/kg (Calculated) : 75.3 HEPARIN DW (KG): 74.8  Vital Signs: Temp: 98.4 F (36.9 C) (06/11 2321) Temp Source: Oral (06/11 2321) BP: 152/75 (06/12 0400) Pulse Rate: 72 (06/12 0130)  Labs: Recent Labs    11/29/23 1746 11/29/23 1812 11/29/23 2210 11/30/23 0231 11/30/23 0414  HGB 15.1 15.3  --   --  13.8  HCT 44.2 45.0  --   --  41.3  PLT 197  --   --   --  155  APTT 31  --   --   --   --   LABPROT 13.7  --   --   --   --   INR 1.0  --   --   --   --   CREATININE 0.85 0.90  --   --  0.80  TROPONINIHS 53*  --  789* 1,143*  --     Estimated Creatinine Clearance: 62.3 mL/min (by C-G formula based on SCr of 0.8 mg/dL).   Medical History: Past Medical History:  Diagnosis Date   Asymptomatic PVCs    Diabetes mellitus    well controlled   Diastolic dysfunction    Hyperlipidemia    Hypertension    Mitral regurgitation    mild    Medications:  No current facility-administered medications on file prior to encounter.   Current Outpatient Medications on File Prior to Encounter  Medication Sig Dispense Refill   amLODipine  (NORVASC ) 10 MG tablet Take 1 tablet (10 mg total) by mouth daily. 90 tablet 3   aspirin  81 MG chewable tablet Chew 1 tablet (81 mg total) by mouth daily. 60 tablet 1   atorvastatin  (LIPITOR) 10 MG tablet TAKE ONE-HALF TABLET BY MOUTH DAILY 15 tablet 0   carvedilol  (COREG ) 6.25 MG tablet TAKE ONE TABLET BY MOUTH TWICE A DAY WITH A MEAL 30 tablet 0    Cholecalciferol  (VITAMIN D-3) 1000 UNITS CAPS Take 1,000 Units by mouth daily.     Cyanocobalamin  (VITAMIN B 12 PO) Take 1,000 mcg by mouth daily.      fexofenadine (ALLEGRA) 180 MG tablet Take 180 mg by mouth daily.     glipiZIDE  (GLUCOTROL ) 5 MG 24 hr tablet Take 5 mg by mouth daily.     MAGNESIUM  PO Take 400 mg by mouth daily.     naproxen sodium (ALEVE) 220 MG tablet Take 220 mg by mouth daily as needed (for pain).     pyridOXINE  (VITAMIN B6) 25 MG tablet Take 25 mg by mouth daily.     tamsulosin  (FLOMAX ) 0.4 MG CAPS capsule TAKE ONE CAPSULE (0.4MG  TOTAL) BY MOUTH DAILY 90 capsule 3   ACCU-CHEK AVIVA PLUS test strip      ACCU-CHEK SOFTCLIX LANCETS lancets      Blood Glucose Monitoring Suppl (ACCU-CHEK AVIVA PLUS) W/DEVICE KIT      [DISCONTINUED] clotrimazole -betamethasone  (LOTRISONE ) cream Apply to affected area 2 times daily 15 g 3   [DISCONTINUED] latanoprost  (XALATAN ) 0.005 % ophthalmic solution Place  1 drop into the right eye daily.     [DISCONTINUED] Lutein-Zeaxanthin 25-5 MG CAPS Take 1 capsule by mouth daily.       Assessment: 89 y.o. male with elevated troponin for heparin Goal of Therapy:  Heparin level 0.3-0.7 units/ml Monitor platelets by anticoagulation protocol: Yes   Plan:  Heparin 4000 units IV bolus, then start heparin 900 units/hr Check heparin level in 8 hours.   Carlota Chestnut 11/30/2023,5:34 AM

## 2023-11-30 NOTE — Progress Notes (Signed)
 PHARMACY - ANTICOAGULATION CONSULT NOTE  Pharmacy Consult for heparin Indication: chest pain/ACS  Allergies  Allergen Reactions   Enalapril Maleate Other (See Comments)    Swelling **Vasotec**   Losartan  Other (See Comments)    Makes him feel jittery   Menthol Itching   Shellfish Allergy    Sulfa Antibiotics Other (See Comments)    Doesn't recall reaction   Sulfamethoxazole Other (See Comments)    Doesn't recall reaction    Patient Measurements: Height: 5' 11 (180.3 cm) Weight: 74.8 kg (165 lb) IBW/kg (Calculated) : 75.3 HEPARIN DW (KG): 74.8  Vital Signs: Temp: 97.6 F (36.4 C) (06/12 1444) Temp Source: Oral (06/12 1444) BP: 134/71 (06/12 1444) Pulse Rate: 73 (06/12 1332)  Labs: Recent Labs    11/29/23 1746 11/29/23 1812 11/29/23 2210 11/30/23 0231 11/30/23 0414 11/30/23 1436  HGB 15.1 15.3  --   --  13.8  --   HCT 44.2 45.0  --   --  41.3  --   PLT 197  --   --   --  155  --   APTT 31  --   --   --   --   --   LABPROT 13.7  --   --   --   --   --   INR 1.0  --   --   --   --   --   HEPARINUNFRC  --   --   --   --   --  0.15*  CREATININE 0.85 0.90  --   --  0.80  --   TROPONINIHS 53*  --  789* 1,143*  --   --     Estimated Creatinine Clearance (by C-G formula based on SCr of 0.8 mg/dL) Male: 16.1 mL/min Male: 62.3 mL/min   Medical History: Past Medical History:  Diagnosis Date   Asymptomatic PVCs    Diabetes mellitus    well controlled   Diastolic dysfunction    Hyperlipidemia    Hypertension    Mitral regurgitation    mild    Medications:  No current facility-administered medications on file prior to encounter.   Current Outpatient Medications on File Prior to Encounter  Medication Sig Dispense Refill   amLODipine  (NORVASC ) 10 MG tablet Take 1 tablet (10 mg total) by mouth daily. 90 tablet 3   aspirin  81 MG chewable tablet Chew 1 tablet (81 mg total) by mouth daily. 60 tablet 1   atorvastatin  (LIPITOR) 10 MG tablet TAKE ONE-HALF  TABLET BY MOUTH DAILY 15 tablet 0   carvedilol  (COREG ) 6.25 MG tablet TAKE ONE TABLET BY MOUTH TWICE A DAY WITH A MEAL 30 tablet 0   Cholecalciferol  (VITAMIN D-3) 1000 UNITS CAPS Take 1,000 Units by mouth daily.     Cyanocobalamin  (VITAMIN B 12 PO) Take 1,000 mcg by mouth daily.      fexofenadine (ALLEGRA) 180 MG tablet Take 180 mg by mouth daily.     glipiZIDE  (GLUCOTROL ) 5 MG 24 hr tablet Take 5 mg by mouth daily.     MAGNESIUM  PO Take 400 mg by mouth daily.     naproxen sodium (ALEVE) 220 MG tablet Take 220 mg by mouth daily as needed (for pain).     pyridOXINE  (VITAMIN B6) 25 MG tablet Take 25 mg by mouth daily.     tamsulosin  (FLOMAX ) 0.4 MG CAPS capsule TAKE ONE CAPSULE (0.4MG  TOTAL) BY MOUTH DAILY 90 capsule 3   ACCU-CHEK AVIVA PLUS test strip  ACCU-CHEK SOFTCLIX LANCETS lancets      Blood Glucose Monitoring Suppl (ACCU-CHEK AVIVA PLUS) W/DEVICE KIT      [DISCONTINUED] clotrimazole -betamethasone  (LOTRISONE ) cream Apply to affected area 2 times daily 15 g 3   [DISCONTINUED] latanoprost  (XALATAN ) 0.005 % ophthalmic solution Place 1 drop into the right eye daily.     [DISCONTINUED] Lutein-Zeaxanthin 25-5 MG CAPS Take 1 capsule by mouth daily.       Assessment: 89 y.o. adult with elevated troponin for heparin  PM: HL subtherapeutic at 0.15 on heparin 900 units/hr. No issues with the infusion or bleeding reported per RN  Goal of Therapy:  Heparin level 0.3-0.7 units/ml Monitor platelets by anticoagulation protocol: Yes   Plan:  Heparin 1000 units IV bolus, then increase heparin to 1100 units/hr Check heparin level in 8 hours.  Daily CBC F/u plans after [facility] tomorrow  Armanda Bern, PharmD, BCPS 11/30/2023,3:54 PM  Please check AMION for all [facility] Pharm[facility] phone numbers After 10:00 PM, call Main Pharmacy [phone]

## 2023-11-30 NOTE — ED Notes (Signed)
 Pt ambulated to bathroom

## 2023-11-30 NOTE — Progress Notes (Signed)
  Progress Note   Patient: Shawn Stephenson:[id number] DOB: 03-14-1930 DOA: 11/29/2023     0 DOS: the patient was seen and examined on 11/30/2023    Assessment and Plan: Elevated troponin  - IV heparin drip - NPO at midnight for LHC on 12/01/2023 - ASA 81 mg PO daily  - Lipitor 5 mg PO daily  - Appreciate cardiology follow up   TIA  - Symptoms resolved - MRI brain --> No evidence for acute or subacute infarct - ASA 81 mg PO daily  - Lipitor 5 mg PO daily   DM2 - Novolog  SS ACHS   HTN  - Norvasc  5 mg PO daily  - Coreg  6.25 mg PO bid   Subjective: Pt seen and examined at the bedside. Neurologic symptoms resolved. MRI negative for acute CVA. Troponin has uptrended 789 -->1143. Cardiology has made the pt NPO for LHC tmr (12/01/2023). Continue heparin drip.  Physical Exam: Vitals:   11/30/23 0743 11/30/23 0900 11/30/23 0930 11/30/23 0948  BP:  138/70 (!) 161/80   Pulse:  (!) 57 (!) 58   Resp:  13 15   Temp: 97.9 F (36.6 C)   (!) 97 F (36.1 C)  TempSrc: Oral   Axillary  SpO2:  92% 97%   Weight:      Height:       Physical Exam HENT:     Head: Normocephalic.     Mouth/Throat:     Mouth: Mucous membranes are moist.   Cardiovascular:     Rate and Rhythm: Bradycardia present.  Pulmonary:     Effort: Pulmonary effort is normal.  Abdominal:     Palpations: Abdomen is soft.   Musculoskeletal:        General: Normal range of motion.   Skin:    General: Skin is warm.   Neurological:     Mental Status: He is alert. Mental status is at baseline.   Psychiatric:        Mood and Affect: Mood normal.      Disposition: Status is: Observation The patient remains OBS appropriate and will d/c before 2 midnights.  Planned Discharge Destination: Home    Time spent: 35 minutes  Author: Louvina Cleary , MD 11/30/2023 10:51 AM  For on call review [email or web address].

## 2023-11-30 NOTE — Progress Notes (Signed)
 Carotid duplex has been completed.   Results can be found under chart review under CV PROC. 11/30/2023 2:25 PM Mahayla Haddaway RVT, RDMS

## 2023-11-30 NOTE — Progress Notes (Addendum)
 Progress Note  Patient Name: Shawn Stephenson Date of Encounter: 11/30/2023 [facility] Cardiologist: None  Interval Summary   Patient reports resolution of AMS symptoms that he experienced yesterday afternoon Admits to some recent increased DOE with short walks (to mailbox) Reports history of HCM in daughter, he believes he has as well   Vital Signs Vitals:   11/30/23 0500 11/30/23 0730 11/30/23 0731 11/30/23 0743  BP: (!) 156/67 (!) 151/68    Pulse:  63 63   Resp: 12 (!) 23 17   Temp: 98.3 F (36.8 C)   97.9 F (36.6 C)  TempSrc: Oral   Oral  SpO2:  95% 97%   Weight:      Height:       No intake or output data in the 24 hours ending 11/30/23 0937    11/29/2023    5:11 PM 05/28/2021    3:31 PM 12/29/2020    2:33 PM  Last 3 Weights  Weight (lbs) 165 lb 171 lb 3.2 oz 157 lb 3.2 oz  Weight (kg) 74.844 kg 77.656 kg 71.305 kg      Telemetry/ECG  Sinus, RBBB, HR 60s - Personally Reviewed  Physical Exam  GEN: No acute distress.   Neck: No JVD Cardiac: RRR, faint murmur.  Respiratory: Clear to auscultation bilaterally. GI: Soft, nontender, non-distended  MS: trace edema, patient reports chronic  Assessment & Plan  DOM HAVERLAND is a 89 y.o. male with a hx of HTN, HLD, DM2, frequent PVCs, and prior right hip fracture who is being seen for elevated troponins   Elevated troponin levels  Presented with AMS Denies any prior or current chest pain Troponin level 53 > 789 > 1,143 CTPA was negative for PE, showed coronary calcifications Given ASA 325 mg EKG showed sinus rhythm with previously noted RBBB Pending echocardiogram  Plan for LHC with possible PCI tomorrow NPO at midnight  Informed Consent   Shared Decision Making/Informed Consent The risks [stroke (1 in 1000), death (1 in 1000), kidney failure [usually temporary] (1 in 500), bleeding (1 in 200), allergic reaction [possibly serious] (1 in 200)], benefits (diagnostic support and management of  coronary artery disease) and alternatives of a cardiac catheterization were discussed in detail with Shawn Stephenson and he is willing to proceed.     Altered mental status  Possible TIA Cerebral vascular disease Carotid bruits Hyperlipidemia  Presented with AMS with full resolution of symptoms  CT head showed no acute change, generalized atrophy, chronic microvascular disease MRI brain showed no acute change, age-related cerebral atrophy, mild to moderate chronic small vessel ischemic disease 11/29/2023: ALT 32 11/30/2023: HDL 46; LDL Cholesterol 82  Defer to neurology for further management  Continue statin, ASA 81 mg daily Pending carotid ultrasound   For questions or updates, please contact [facility] Please consult [email or web address] for contact info under       Signed, Jiles Mote, PA-C   Patient seen and examined with Laquita Plant, PA-C.  Agree as above, with the following exceptions and changes as noted below.  Patient is a 89 year old gentleman presented with what appears to be a TIA however incidentally noted to have a rising troponin with a history of worsening exertional shortness of breath at home.  No chest pain or acute shortness of breath endorsed by patient on presentation to the ED.  Does have a history of hypertension for which he has previously been seen by cardiology.  Also has a family history of hypertrophic cardiomyopathy  with obstruction, his daughter has had a septal myectomy at the [facility] clinic, she reports being gene negative.  He is currently chest pain-free with no shortness of breath, and mildly elevated blood pressure 158/80.  He has dense severe coronary artery calcifications in a three-vessel distribution on recent CT chest, coupled with rising troponins suggest NSTEMI, and we have discussed pursuing cardiac catheterization.  Unfortunately this cannot occur until tomorrow due to laboratory schedule.  Gen: NAD, CV: RRR, no murmurs, Lungs: clear,  Abd: soft, Extrem: Warm, well perfused, no edema, Neuro/Psych: alert and oriented x 3, normal mood and affect. All available labs, radiology testing, previous records reviewed.  I have preliminarily reviewed his echocardiogram images which demonstrated overall preserved ejection fraction but possible apical regional wall motion abnormality, inferolaterally.  There is also abnormal septal motion due to conduction system delay.  His basal septum measures 18 to 19 mm and myocardium does appear thickened mild to moderately concentrically.  We discussed possible etiologies including hypertensive heart disease, hypertrophic cardiomyopathy, or possibly amyloidosis given his age and history of neuropathy.  Unfortunately strain was not performed on his echocardiogram.  We discussed that if nonobstructive CAD is found, this may represent demand ischemia from blood pressure and microvascular dysfunction in the setting of LVH.  Will start with cardiac catheterization for NSTEMI and proceed with workup afterward pending results.  No significant valvular heart disease on preliminary review of echocardiogram.  No systolic anterior motion of the mitral valve and no demonstrable LVOT obstruction on current echo.  Provocation not performed.  If he does in fact have hypertrophic cardiomyopathy, suspect it would be a relatively low risk variant given his age of 63 with no antecedent symptoms.  Shawn Leighty A Laiden Milles, MD 11/30/23 1:56 PM

## 2023-11-30 NOTE — ED Notes (Signed)
 Patient transported to CT

## 2023-11-30 NOTE — H&P (Addendum)
 History and Physical    Shawn Stephenson HYQ:[id number] DOB: Jan 25, 1930 DOA: 11/29/2023  PCP: Artemisa Bile, MD   Patient coming from: Home   Chief Complaint: Headache, speech difficulty, confusion   HPI: Shawn Stephenson is a 89 y.o. male with medical history significant for HTN, DM, HLD, and chronic HFpEF who presents with headache, confusion, and difficulty speaking.   Patient reports that he was in his usual state of health until he developed acute onset of left-sided headache.  Patient himself felt as though he was having difficulty speaking and difficulty thinking.  He also reports fatigue but denies any chest pain, shortness of breath, nausea, vomiting, or focal numbness or weakness.  At time of admission, the patient reports some ongoing fatigue but states that the headache, speech difficulty, and confusion have resolved and he essentially feels back to his normal self.  ED Course: Upon arrival to the ED, patient is found to be afebrile and saturating well on room air with normal HR and elevated BP.  Labs are most notable for normal creatinine, glucose 213, normal CBC, and troponin of 53 which then increased to 789.  MRI brain was negative for acute intracranial abnormality and CTA chest was negative for PE.  Cardiology evaluated the patient in the emergency department and 325 mg aspirin  was administered.  Review of Systems:  All other systems reviewed and apart from HPI, are negative.  Past Medical History:  Diagnosis Date   Asymptomatic PVCs    Diabetes mellitus    well controlled   Diastolic dysfunction    Hyperlipidemia    Hypertension    Mitral regurgitation    mild    Past Surgical History:  Procedure Laterality Date   INTRAMEDULLARY (IM) NAIL INTERTROCHANTERIC Right 12/09/2017   Procedure: INTRAMEDULLARY (IM) NAIL INTERTROCHANTRIC;  Surgeon: Adonica Hoose, MD;  Location: [facility] OR;  Service: Orthopedics;  Laterality: Right;    Social History:   reports that he has  never smoked. He has never used smokeless tobacco. He reports that he does not drink alcohol and does not use drugs.  Allergies  Allergen Reactions   Enalapril Maleate Other (See Comments)    Swelling **Vasotec**   Losartan  Other (See Comments)    Makes him feel jittery   Menthol Itching   Shellfish Allergy    Sulfa Antibiotics Other (See Comments)    Doesn't recall reaction   Sulfamethoxazole Other (See Comments)    Doesn't recall reaction    Family History  Problem Relation Age of Onset   Stroke Father    Diabetes Father    Hypertension Father    Hypertension Mother    Arthritis Mother    Hypertension Brother    Stroke Brother    Kidney disease Brother      Prior to Admission medications   Medication Sig Start Date End Date Taking? Authorizing Provider  amLODipine  (NORVASC ) 10 MG tablet Take 1 tablet (10 mg total) by mouth daily. 05/28/21  Yes [place], Peter M, MD  aspirin  81 MG chewable tablet Chew 1 tablet (81 mg total) by mouth daily. 12/12/17  Yes [place], Polly Brink, MD  atorvastatin  (LIPITOR) 10 MG tablet TAKE ONE-HALF TABLET BY MOUTH DAILY 08/31/23  Yes [place], Peter M, MD  carvedilol  (COREG ) 6.25 MG tablet TAKE ONE TABLET BY MOUTH TWICE A DAY WITH A MEAL 11/16/23  Yes [place], Peter M, MD  Cholecalciferol  (VITAMIN D-3) 1000 UNITS CAPS Take 1,000 Units by mouth daily.   Yes [provider]  Cyanocobalamin  (VITAMIN  B 12 PO) Take 1,000 mcg by mouth daily.    Yes [provider]  fexofenadine (ALLEGRA) 180 MG tablet Take 180 mg by mouth daily.   Yes [provider]  glipiZIDE  (GLUCOTROL ) 5 MG 24 hr tablet Take 5 mg by mouth daily.   Yes [provider]  MAGNESIUM  PO Take 400 mg by mouth daily.   Yes [provider]  naproxen sodium (ALEVE) 220 MG tablet Take 220 mg by mouth daily as needed (for pain).   Yes [provider]  pyridOXINE  (VITAMIN B6) 25 MG tablet Take 25 mg by mouth daily.   Yes [provider]   tamsulosin  (FLOMAX ) 0.4 MG CAPS capsule TAKE ONE CAPSULE (0.4MG  TOTAL) BY MOUTH DAILY 06/09/23  Yes Trent Frizzle, MD  ACCU-CHEK AVIVA PLUS test strip  04/29/14   [provider]  ACCU-CHEK SOFTCLIX LANCETS lancets  04/29/14   [provider]  Blood Glucose Monitoring Suppl (ACCU-CHEK AVIVA PLUS) W/DEVICE KIT  04/29/14   [provider]    Physical Exam: Vitals:   11/29/23 2200 11/29/23 2230 11/29/23 2321 11/30/23 0130  BP: (!) 165/87 (!) 172/87  (!) 150/80  Pulse: 68 64  72  Resp: 15 18  15   Temp:   98.4 F (36.9 C)   TempSrc:   Oral   SpO2: 98% 96%  98%  Weight:      Height:        Constitutional: NAD, calm  Eyes: PERTLA, lids and conjunctivae normal ENMT: Mucous membranes are moist. Posterior pharynx clear of any exudate or lesions.   Neck: supple, no masses  Respiratory: no wheezing, no crackles. No accessory muscle use.  Cardiovascular: S1 & S2 heard, regular rate and rhythm. Mild pretibial pitting edema.  Abdomen: No tenderness, soft. Bowel sounds active.  Musculoskeletal: no clubbing / cyanosis. No joint deformity upper and lower extremities.   Skin: no significant rashes, lesions, ulcers. Warm, dry, well-perfused. Neurologic: CN 2-12 grossly intact. Sensation to light touch intact. Strength 5/5 in all 4 limbs. Alert and oriented.  Psychiatric: Pleasant. Cooperative.    Labs and Imaging on Admission: I have personally reviewed following labs and imaging studies  CBC: Recent Labs  Lab 11/29/23 1746 11/29/23 1812  WBC 5.5  --   NEUTROABS 3.6  --   HGB 15.1 15.3  HCT 44.2 45.0  MCV 93.6  --   PLT 197  --    Basic Metabolic Panel: Recent Labs  Lab 11/29/23 1746 11/29/23 1812  NA 137 139  K 3.5 3.5  CL 102 99  CO2 25  --   GLUCOSE 213* 214*  BUN 15 19  CREATININE 0.85 0.90  CALCIUM  9.1  --   MG 2.1  --    GFR: Estimated Creatinine Clearance: 55.4 mL/min (by C-G formula based on SCr of 0.9 mg/dL). Liver Function  Tests: Recent Labs  Lab 11/29/23 1746  AST 36  ALT 32  ALKPHOS 90  BILITOT 0.9  PROT 8.0  ALBUMIN 4.2   No results for input(s): LIPASE, AMYLASE in the last 168 hours. No results for input(s): AMMONIA in the last 168 hours. Coagulation Profile: Recent Labs  Lab 11/29/23 1746  INR 1.0   Cardiac Enzymes: No results for input(s): CKTOTAL, CKMB, CKMBINDEX, TROPONINI in the last 168 hours. BNP (last 3 results) No results for input(s): PROBNP in the last 8760 hours. HbA1C: No results for input(s): HGBA1C in the last 72 hours. CBG: Recent Labs  Lab 11/29/23 1752  GLUCAP  205*   Lipid Profile: No results for input(s): CHOL, HDL, LDLCALC, TRIG, CHOLHDL, LDLDIRECT in the last 72 hours. Thyroid Function Tests: No results for input(s): TSH, T4TOTAL, FREET4, T3FREE, THYROIDAB in the last 72 hours. Anemia Panel: No results for input(s): VITAMINB12, FOLATE, FERRITIN, TIBC, IRON, RETICCTPCT in the last 72 hours. Urine analysis:    Component Value Date/Time   COLORURINE STRAW (A) 11/29/2023 1746   APPEARANCEUR CLEAR 11/29/2023 1746   APPEARANCEUR Clear 06/22/2021 1437   LABSPEC 1.008 11/29/2023 1746   PHURINE 7.0 11/29/2023 1746   GLUCOSEU 150 (A) 11/29/2023 1746   HGBUR SMALL (A) 11/29/2023 1746   BILIRUBINUR NEGATIVE 11/29/2023 1746   BILIRUBINUR Negative 06/22/2021 1437   KETONESUR NEGATIVE 11/29/2023 1746   PROTEINUR 30 (A) 11/29/2023 1746   NITRITE NEGATIVE 11/29/2023 1746   LEUKOCYTESUR NEGATIVE 11/29/2023 1746   Sepsis Labs: @LABRCNTIP (procalcitonin:4,lacticidven:4) )No results found for this or any previous visit (from the past 240 hours).   Radiological Exams on Admission: CT Angio Chest PE W/Cm &/Or Wo Cm Result Date: 11/30/2023 CLINICAL DATA:  Shortness of breath EXAM: CT ANGIOGRAPHY CHEST WITH CONTRAST TECHNIQUE: Multidetector CT imaging of the chest was performed using the standard protocol during bolus  administration of intravenous contrast. Multiplanar CT image reconstructions and MIPs were obtained to evaluate the vascular anatomy. RADIATION DOSE REDUCTION: This exam was performed according to the departmental dose-optimization program which includes automated exposure control, adjustment of the mA and/or kV according to patient size and/or use of iterative reconstruction technique. CONTRAST:  75mL OMNIPAQUE IOHEXOL 350 MG/ML SOLN COMPARISON:  None Available. FINDINGS: Cardiovascular: Atherosclerotic calcifications of the thoracic aorta are noted. The degree of opacification is limited. Heavy coronary calcifications are noted. Mild cardiac enlargement is seen. Pulmonary artery shows a normal branching pattern. No filling defect to suggest pulmonary embolism is noted. Mediastinum/Nodes: Thoracic inlet is within normal limits. Scattered small mediastinal lymph nodes are noted likely reactive in nature. The esophagus is within normal limits. Lungs/Pleura: Lungs are well aerated bilaterally. No focal infiltrate or sizable effusion is seen. No pneumothorax is noted. Scattered nodules are noted throughout both lungs measuring 5 mm and less. No follow-up is recommended. Upper Abdomen: Visualized upper abdomen shows no acute abnormality. Musculoskeletal: No acute bony abnormality is noted. Review of the MIP images confirms the above findings. IMPRESSION: No evidence of pulmonary emboli. Multiple pulmonary nodules. Most significant: Solid pulmonary nodule measuring 5 mm. Per Fleischner Society Guidelines, no routine follow-up imaging is recommended. These guidelines do not apply to immunocompromised patients and patients with cancer. Follow up in patients with significant comorbidities as clinically warranted. For lung cancer screening, adhere to Lung-RADS guidelines. Reference: Radiology. 2017; 284(1):228-43. Aortic Atherosclerosis (ICD10-I70.0). Electronically Signed   By: Violeta Grey M.D.   On: 11/30/2023 01:03    MR BRAIN WO CONTRAST Result Date: 11/29/2023 CLINICAL DATA:  Initial evaluation for acute neuro deficit, stroke suspected. EXAM: MRI HEAD WITHOUT CONTRAST TECHNIQUE: Multiplanar, multiecho pulse sequences of the brain and surrounding structures were obtained without intravenous contrast. COMPARISON:  CT from earlier the same day. FINDINGS: Brain: Diffuse prominence of the CSF containing spaces compatible generalized cerebral atrophy. Patchy T2/FLAIR hyperintensity involving the periventricular and deep white matter both cerebral hemispheres as well as the pons, consistent chronic small vessel ischemic disease, mild to moderate for age. No evidence for acute or subacute infarct. No areas of chronic cortical infarction. No acute or chronic intracranial blood products. No mass lesion, midline shift or mass effect. Ventricular prominence related to global parenchymal  volume loss without hydrocephalus. No extra-axial fluid collection. Pituitary gland within normal limits. Vascular: Major intracranial vascular flow voids are maintained. Skull and upper cervical spine: Craniocervical junction within normal limits. Bone marrow signal intensity normal. No scalp soft tissue abnormality. Sinuses/Orbits: Prior bilateral ocular lens replacement. Paranasal sinuses are largely clear. No significant mastoid effusion. Other: None. IMPRESSION: 1. No acute intracranial abnormality. 2. Generalized age-related cerebral atrophy with mild to moderate chronic small vessel ischemic disease. Electronically Signed   By: Virgia Griffins M.D.   On: 11/29/2023 20:36   CT HEAD WO CONTRAST Result Date: 11/29/2023 CLINICAL DATA:  Altered mental status EXAM: CT HEAD WITHOUT CONTRAST TECHNIQUE: Contiguous axial images were obtained from the base of the skull through the vertex without intravenous contrast. RADIATION DOSE REDUCTION: This exam was performed according to the departmental dose-optimization program which includes automated  exposure control, adjustment of the mA and/or kV according to patient size and/or use of iterative reconstruction technique. COMPARISON:  12/09/2017 FINDINGS: Brain: There is no mass, hemorrhage or extra-axial collection. There is generalized atrophy without lobar predilection. Hypodensity of the white matter is most commonly associated with chronic microvascular disease. Vascular: Atherosclerotic calcification of the vertebral and internal carotid arteries at the skull base. No abnormal hyperdensity of the major intracranial arteries or dural venous sinuses. Skull: The visualized skull base, calvarium and extracranial soft tissues are normal. Sinuses/Orbits: No fluid levels or advanced mucosal thickening of the visualized paranasal sinuses. No mastoid or middle ear effusion. Normal orbits. Other: None. IMPRESSION: 1. No acute intracranial abnormality. 2. Generalized atrophy and findings of chronic microvascular disease. Electronically Signed   By: Juanetta Nordmann M.D.   On: 11/29/2023 18:37    EKG: Independently reviewed. Sinus rhythm, RBBB.   Assessment/Plan   1. Elevated troponin  - Appreciate cardiology assessment and recommendations  - Continue cardiac monitoring, repeat troponin, check echo, CCTA, lipids, and A1c, continue ASA, statin, and beta-blocker    2. Transient neurologic symptoms  - Back to baseline by time of admission  - MRI negative for acute intracranial abnormality  - Check echo, carotid imaging, lipids, and A1c, continue ASA and statin    3. Type II DM  - Check CBGs and use low-intensity SSI for now    4. Hypertension  - Continue Norvasc  and Coreg      DVT prophylaxis: Lovenox   Code Status: Full  Level of Care: Level of care: Telemetry Cardiac Family Communication: Daughter at bedside  Disposition Plan:  Patient is from: home  Anticipated d/c is to: home  Anticipated d/c date is: 12/01/23  Patient currently: Pending echo, CCTA, carotid dopplers  Consults called:  Cardiology  Admission status: Observation     Walton Guppy, MD [facility]  11/30/2023, 3:02 AM

## 2023-12-01 ENCOUNTER — Ambulatory Visit (HOSPITAL_COMMUNITY): Admission: RE | Admit: 2023-12-01 | Source: Home / Self Care | Admitting: Cardiovascular Disease

## 2023-12-01 ENCOUNTER — Encounter (HOSPITAL_COMMUNITY)
Admission: EM | Disposition: A | Discharge status: Home / Self Care | Payer: Self-pay | Source: Home / Self Care | Attending: Emergency Medicine

## 2023-12-01 DIAGNOSIS — I2510 Atherosclerotic heart disease of native coronary artery without angina pectoris: Secondary | ICD-10-CM | POA: Diagnosis not present

## 2023-12-01 DIAGNOSIS — I214 Non-ST elevation (NSTEMI) myocardial infarction: Secondary | ICD-10-CM | POA: Diagnosis not present

## 2023-12-01 DIAGNOSIS — R7989 Other specified abnormal findings of blood chemistry: Secondary | ICD-10-CM | POA: Diagnosis not present

## 2023-12-01 DIAGNOSIS — R29818 Other symptoms and signs involving the nervous system: Secondary | ICD-10-CM | POA: Diagnosis not present

## 2023-12-01 DIAGNOSIS — E0865 Diabetes mellitus due to underlying condition with hyperglycemia: Secondary | ICD-10-CM | POA: Diagnosis not present

## 2023-12-01 HISTORY — PX: LEFT HEART CATH AND CORONARY ANGIOGRAPHY: CATH118249

## 2023-12-01 LAB — COMPREHENSIVE METABOLIC PANEL WITH GFR
ALT: 27 U/L (ref 0–44)
AST: 31 U/L (ref 15–41)
Albumin: 3.7 g/dL (ref 3.5–5.0)
Alkaline Phosphatase: 70 U/L (ref 38–126)
Anion gap: 12 (ref 5–15)
BUN: 18 mg/dL (ref 8–23)
CO2: 23 mmol/L (ref 22–32)
Calcium: 8.9 mg/dL (ref 8.9–10.3)
Chloride: 103 mmol/L (ref 98–111)
Creatinine, Ser: 0.95 mg/dL (ref 0.61–1.24)
GFR, Estimated: >60 mL/min (ref >60)
Glucose, Bld: 165 mg/dL — ABNORMAL HIGH (ref 70–99)
Potassium: 3.7 mmol/L (ref 3.5–5.1)
Sodium: 138 mmol/L (ref 135–145)
Total Bilirubin: 1.3 mg/dL — ABNORMAL HIGH (ref 0.0–1.2)
Total Protein: 6.9 g/dL (ref 6.5–8.1)

## 2023-12-01 LAB — PROTIME-INR
INR: 1.2 (ref 0.8–1.2)
Prothrombin Time: 15 s (ref 11.4–15.2)

## 2023-12-01 LAB — CBC
HCT: 41.7 % (ref 39.0–52.0)
Hemoglobin: 14 g/dL (ref 13.0–17.0)
MCH: 31.3 pg (ref 26.0–34.0)
MCHC: 33.6 g/dL (ref 30.0–36.0)
MCV: 93.1 fL (ref 80.0–100.0)
Platelets: 167 10*3/uL (ref 150–400)
RBC: 4.48 MIL/uL (ref 4.22–5.81)
RDW: 12.5 % (ref 11.5–15.5)
WBC: 5.3 10*3/uL (ref 4.0–10.5)
nRBC: 0 % (ref 0.0–0.2)

## 2023-12-01 LAB — HEPARIN LEVEL (UNFRACTIONATED)
Heparin Unfractionated: 0.45 [IU]/mL (ref 0.30–0.70)
Heparin Unfractionated: 0.47 [IU]/mL (ref 0.30–0.70)

## 2023-12-01 LAB — GLUCOSE, CAPILLARY
Glucose-Capillary: 175 mg/dL — ABNORMAL HIGH (ref 70–99)
Glucose-Capillary: 180 mg/dL — ABNORMAL HIGH (ref 70–99)

## 2023-12-01 LAB — MAGNESIUM: Magnesium: 1.9 mg/dL (ref 1.7–2.4)

## 2023-12-01 SURGERY — LEFT HEART CATH AND CORONARY ANGIOGRAPHY
Anesthesia: LOCAL

## 2023-12-01 MED ORDER — HEPARIN (PORCINE) IN NACL 1000-0.9 UT/500ML-% IV SOLN
INTRAVENOUS | Status: DC | PRN
  Administered 2023-12-01 (×2): 500 mL

## 2023-12-01 MED ORDER — LABETALOL HCL 5 MG/ML IV SOLN: 10.0000 mg | INTRAVENOUS | Status: DC | PRN

## 2023-12-01 MED ORDER — VERAPAMIL HCL 2.5 MG/ML IV SOLN
INTRAVENOUS | Status: AC
Start: 2023-12-01 — End: 2023-12-01
  Filled 2023-12-01: qty 2

## 2023-12-01 MED ORDER — SODIUM CHLORIDE 0.9 % WEIGHT BASED INFUSION: 3.0000 mL/kg/h | INTRAVENOUS | Status: DC

## 2023-12-01 MED ORDER — SODIUM CHLORIDE 0.9% FLUSH: 3.0000 mL | INTRAVENOUS | Status: DC | PRN

## 2023-12-01 MED ORDER — CLOPIDOGREL BISULFATE 75 MG PO TABS: 75.0000 mg | ORAL_TABLET | Freq: Every day | ORAL | Status: DC

## 2023-12-01 MED ORDER — LIDOCAINE HCL (PF) 1 % IJ SOLN
INTRAMUSCULAR | Status: AC
Start: 2023-12-01 — End: 2023-12-01
  Filled 2023-12-01: qty 30

## 2023-12-01 MED ORDER — HEPARIN SODIUM (PORCINE) 1000 UNIT/ML IJ SOLN
INTRAMUSCULAR | Status: DC | PRN
  Administered 2023-12-01: 8000 [IU] via INTRAVENOUS

## 2023-12-01 MED ORDER — HEPARIN SODIUM (PORCINE) 1000 UNIT/ML IJ SOLN
INTRAMUSCULAR | Status: AC
  Filled 2023-12-01: qty 10

## 2023-12-01 MED ORDER — CLOPIDOGREL BISULFATE 75 MG PO TABS
300.0000 mg | ORAL_TABLET | Freq: Once | ORAL | Status: AC
  Administered 2023-12-01: 300 mg via ORAL
  Filled 2023-12-01: qty 4

## 2023-12-01 MED ORDER — IOHEXOL 350 MG/ML SOLN
INTRAVENOUS | Status: DC | PRN
  Administered 2023-12-01: 40 mL via INTRA_ARTERIAL

## 2023-12-01 MED ORDER — SODIUM CHLORIDE 0.9 % IV SOLN
INTRAVENOUS | Status: DC
Start: 2023-12-01 — End: 2023-12-01

## 2023-12-01 MED ORDER — SODIUM CHLORIDE 0.9% FLUSH
3.0000 mL | Freq: Two times a day (BID) | INTRAVENOUS | Status: DC
  Administered 2023-12-01: 3 mL via INTRAVENOUS

## 2023-12-01 MED ORDER — SODIUM CHLORIDE 0.9 % IV SOLN: 250.0000 mL | INTRAVENOUS | Status: DC | PRN

## 2023-12-01 MED ORDER — CLOPIDOGREL BISULFATE 75 MG PO TABS
75.0000 mg | ORAL_TABLET | Freq: Every day | ORAL | 3 refills | Status: AC
Start: 2023-12-02 — End: ?

## 2023-12-01 MED ORDER — SODIUM CHLORIDE 0.9 % WEIGHT BASED INFUSION: 1.0000 mL/kg/h | INTRAVENOUS | Status: DC

## 2023-12-01 MED ORDER — ASPIRIN 81 MG PO CHEW
81.0000 mg | CHEWABLE_TABLET | ORAL | Status: AC
  Administered 2023-12-01: 81 mg via ORAL
  Filled 2023-12-01: qty 1

## 2023-12-01 MED ORDER — VERAPAMIL HCL 2.5 MG/ML IV SOLN
INTRAVENOUS | Status: DC | PRN
  Administered 2023-12-01: 10 mL via INTRA_ARTERIAL

## 2023-12-01 MED ORDER — ATORVASTATIN CALCIUM 40 MG PO TABS
ORAL_TABLET | ORAL | 3 refills | Status: DC
Start: 2023-12-01 — End: 2023-12-15

## 2023-12-01 MED ORDER — LIDOCAINE HCL (PF) 1 % IJ SOLN
INTRAMUSCULAR | Status: DC | PRN
  Administered 2023-12-01: 2 mL

## 2023-12-01 MED ORDER — HYDRALAZINE HCL 20 MG/ML IJ SOLN: 10.0000 mg | INTRAMUSCULAR | Status: DC | PRN

## 2023-12-01 SURGICAL SUPPLY — 9 items
CATH 5FR JL3.5 JR4 ANG PIG MP (CATHETERS) IMPLANT
DEVICE RAD COMP TR BAND LRG (VASCULAR PRODUCTS) IMPLANT
GLIDESHEATH SLEND SS 6F .021 (SHEATH) IMPLANT
GUIDEWIRE INQWIRE 1.5J.035X260 (WIRE) IMPLANT
KIT SINGLE USE MANIFOLD (KITS) IMPLANT
KIT SYRINGE INJ CVI SPIKEX1 (MISCELLANEOUS) IMPLANT
PACK CARDIAC CATHETERIZATION (CUSTOM PROCEDURE TRAY) ×2 IMPLANT
SET ATX-X65L (MISCELLANEOUS) IMPLANT
WIRE HI TORQ VERSACORE-J 145CM (WIRE) IMPLANT

## 2023-12-01 NOTE — Discharge Summary (Signed)
 Physician Discharge Summary   Patient: Shawn Stephenson MRN: [id number] DOB: 04/24/1930  Admit date:     11/29/2023  Discharge date: 12/01/23  Discharge Physician: Jodeane Mulligan   PCP: Artemisa Bile, MD   Recommendations at discharge:    Pt to be discharged home.   If you experience worsening fever, chills, chest pain, shortness of breath, or other concerning symptoms, please call your PCP or go to the emergency department immediately.  Discharge Diagnoses: Principal Problem:   Elevated troponin Active Problems:   Hypercholesterolemia   Diabetes mellitus with hyperglycemia (HCC)   Hypertension   Transient neurological symptoms  Resolved Problems:   * No resolved hospital problems. *   Hospital Course:  89 y.o. male with medical history significant for HTN, DM, HLD, and chronic HFpEF who presents with headache, confusion, and difficulty speaking.  Subsequently found to have elevated troponins evaluated by cardiology.  Assessment and Plan:  Concern for NSTEMI - Uptrending cardiac troponin given concern for ACS.  Left heart cath 6/13 severe heavily calcified stenosis ostial LAD not amenable to PCI.  Patient not a candidate for CABG.  Case was discussed with IC team, consensus to pursue conservative management.  Patient already on aspirin , adding Plavix, increasing atorvastatin .  Patient be discharged today.  Per cardiac recommendations, if he has lifestyle limiting angina, can consider PCI of the LAD.  Up with cardiology in 1 week.  Acute metabolic encephalopathy with concern for TIA - Initial presentation with right-sided facial pain weakness.  Initial CT scan negative for acute hemorrhage.  MRI showing no acute stroke.  Symptoms probably resolved.  Treatment for TIA similar to those for ACS above.  Patient to initiate on Plavix.  Continue aspirin .  Can increase atorvastatin  to 40 mg daily.  Headache quickly resolved.  Will recommend patient follow with cardiology and PCP in the  outpatient setting.  Should neurological symptoms return, recommend return to the emergency department.  Diabetes mellitus - Resume previous home medication regimen.  Hypertension - Resume previous antihypertensive regiment.     Consultants: Cardiology Procedures performed: Left heart catheterization Disposition: Home Diet recommendation:  Discharge Diet Orders (From admission, onward)     Start     Ordered   12/01/23 0000  Diet - low sodium heart healthy        12/01/23 1310           Cardiac and Carb modified diet  DISCHARGE MEDICATION: Allergies as of 12/01/2023       Reactions   Enalapril Maleate Other (See Comments)   Swelling **Vasotec**   Losartan  Other (See Comments)   Makes him feel jittery   Menthol Itching   Shellfish Allergy    Sulfa Antibiotics Other (See Comments)   Doesn't recall reaction   Sulfamethoxazole Other (See Comments)   Doesn't recall reaction        Medication List     STOP taking these medications    naproxen sodium 220 MG tablet Commonly known as: ALEVE       TAKE these medications    Accu-Chek Aviva Plus test strip Generic drug: glucose blood   Accu-Chek Aviva Plus w/Device Kit   Accu-Chek Softclix Lancets lancets   amLODipine  10 MG tablet Commonly known as: NORVASC  Take 1 tablet (10 mg total) by mouth daily.   aspirin  81 MG chewable tablet Chew 1 tablet (81 mg total) by mouth daily.   atorvastatin  40 MG tablet Commonly known as: LIPITOR TAKE ONE-HALF TABLET BY MOUTH DAILY What changed:  medication strength   carvedilol  6.25 MG tablet Commonly known as: COREG  TAKE ONE TABLET BY MOUTH TWICE A DAY WITH A MEAL   clopidogrel 75 MG tablet Commonly known as: PLAVIX Take 1 tablet (75 mg total) by mouth daily. Start taking on: December 02, 2023   fexofenadine 180 MG tablet Commonly known as: ALLEGRA Take 180 mg by mouth daily.   glipiZIDE  5 MG 24 hr tablet Commonly known as: GLUCOTROL  XL Take 5 mg by mouth  daily.   MAGNESIUM  PO Take 400 mg by mouth daily.   pyridOXINE  25 MG tablet Commonly known as: VITAMIN B6 Take 25 mg by mouth daily.   tamsulosin  0.4 MG Caps capsule Commonly known as: FLOMAX  TAKE ONE CAPSULE (0.4MG  TOTAL) BY MOUTH DAILY   VITAMIN B 12 PO Take 1,000 mcg by mouth daily.   Vitamin D-3 25 MCG (1000 UT) Caps Take 1,000 Units by mouth daily.         Discharge Exam: Filed Weights   11/29/23 1711  Weight: 74.8 kg    GENERAL:  Alert, pleasant, no acute distress  HEENT:  EOMI CARDIOVASCULAR:  RRR, no murmurs appreciated RESPIRATORY:  Clear to auscultation, no wheezing, rales, or rhonchi GASTROINTESTINAL:  Soft, nontender, nondistended EXTREMITIES:  No LE edema bilaterally NEURO:  No new focal deficits appreciated SKIN: Pressure bandage over right wrist PSYCH:  Appropriate mood and affect     Condition at discharge: improving  The results of significant diagnostics from this hospitalization (including imaging, microbiology, ancillary and laboratory) are listed below for reference.   Imaging Studies: CARDIAC CATHETERIZATION Result Date: 12/01/2023   Ost LAD to Prox LAD lesion is 99% stenosed.   Mid LAD lesion is 90% stenosed.   Prox LAD to Mid LAD lesion is 30% stenosed.   Mid RCA to Dist RCA lesion is 40% stenosed.   RPDA lesion is 50% stenosed.   Prox Cx to Mid Cx lesion is 30% stenosed. Severe, heavily calcified stenosis in the ostial LAD. The lesion is essentially a flush sub-total occlusion with a micro channel. Severe calcification throughout the proximal and mid LAD. The Circumflex and RCA have mild non-obstructive disease Normal LVEDP Recommendations: He has complex, heavily calcified disease in the ostium of the LAD. This lesion is not favorable for PCI. He has no concerning symptoms suggestive of angina. His troponin was elevated but his presentation was with neuro symptoms. He has normal LV function. He is not a candidate for CABG given advanced age. I  have reviewed his case with the IC team. Given advanced age and other findings as above, will take a conservative approach to management. Will add Plavix. He can be discharged today. If he has lifestyle limiting angina, can consider PCI of the LAD.   ECHOCARDIOGRAM COMPLETE Result Date: 11/30/2023    ECHOCARDIOGRAM REPORT   Patient Name:   Shawn Stephenson Date of Exam: 11/30/2023 Medical Rec #:  [id number]         Height:       71.0 in Accession #:    [id number]        Weight:       165.0 lb Date of Birth:  1930-01-29         BSA:          1.943 m Patient Age:    89 years          BP:           167/74 mmHg Patient Gender: M  HR:           60 bpm. Exam Location:  Inpatient Procedure: 2D Echo, Cardiac Doppler and Color Doppler (Both Spectral and Color            Flow Doppler were utilized during procedure). Indications:    Elevated troponin  History:        Patient has prior history of Echocardiogram examinations, most                 recent 12/10/2017. Risk Factors:Diabetes, Dyslipidemia and                 Hypertension.  Sonographer:    Griselda Lederer Referring Phys: [id number] TIMOTHY S OPYD IMPRESSIONS  1. Left ventricular ejection fraction, by estimation, is 55 to 60%. The left ventricle has normal function. The left ventricle has no regional wall motion abnormalities. There is moderate asymmetric left ventricular hypertrophy of the basal-septal segment. Left ventricular diastolic parameters were normal.  2. Right ventricular systolic function is normal. The right ventricular size is normal.  3. Left atrial size was mildly dilated.  4. The mitral valve is grossly normal. Trivial mitral valve regurgitation. No evidence of mitral stenosis.  5. The aortic valve is grossly normal. There is mild calcification of the aortic valve. There is mild thickening of the aortic valve. Aortic valve regurgitation is trivial. Aortic valve sclerosis/calcification is present, without any evidence of aortic stenosis.  6.  The inferior vena cava is normal in size with greater than 50% respiratory variability, suggesting right atrial pressure of 3 mmHg. Comparison(s): Changes from prior study are noted. EF no longer hyperdynamic compared to prior but still within normal range. Conclusion(s)/Recommendation(s): Normal biventricular function without evidence of hemodynamically significant valvular heart disease. FINDINGS  Left Ventricle: Left ventricular ejection fraction, by estimation, is 55 to 60%. The left ventricle has normal function. The left ventricle has no regional wall motion abnormalities. The left ventricular internal cavity size was normal in size. There is  moderate asymmetric left ventricular hypertrophy of the basal-septal segment. Left ventricular diastolic parameters were normal. Right Ventricle: The right ventricular size is normal. No increase in right ventricular wall thickness. Right ventricular systolic function is normal. Left Atrium: Left atrial size was mildly dilated. Right Atrium: Right atrial size was normal in size. Pericardium: Trivial pericardial effusion is present. Mitral Valve: The mitral valve is grossly normal. Trivial mitral valve regurgitation. No evidence of mitral valve stenosis. Tricuspid Valve: The tricuspid valve is grossly normal. Tricuspid valve regurgitation is trivial. No evidence of tricuspid stenosis. Aortic Valve: The aortic valve is grossly normal. There is mild calcification of the aortic valve. There is mild thickening of the aortic valve. Aortic valve regurgitation is trivial. Aortic valve sclerosis/calcification is present, without any evidence of aortic stenosis. Aortic valve mean gradient measures 5.0 mmHg. Aortic valve peak gradient measures 10.1 mmHg. Aortic valve area, by VTI measures 2.57 cm. Pulmonic Valve: The pulmonic valve was not well visualized. Pulmonic valve regurgitation is not visualized. No evidence of pulmonic stenosis. Aorta: The aortic root, ascending aorta and  aortic arch are all structurally normal, with no evidence of dilitation or obstruction. Venous: The inferior vena cava is normal in size with greater than 50% respiratory variability, suggesting right atrial pressure of 3 mmHg. IAS/Shunts: The atrial septum is grossly normal.  LEFT VENTRICLE PLAX 2D LVIDd:         4.70 cm      Diastology LVIDs:  3.00 cm      LV e' medial:    4.05 cm/s LV PW:         1.00 cm      LV E/e' medial:  13.6 LV IVS:        1.10 cm      LV e' lateral:   5.18 cm/s LVOT diam:     2.00 cm      LV E/e' lateral: 10.7 LV SV:         79 LV SV Index:   41 LVOT Area:     3.14 cm  LV Volumes (MOD) LV vol d, MOD A2C: 90.2 ml LV vol d, MOD A4C: 108.0 ml LV vol s, MOD A2C: 41.3 ml LV vol s, MOD A4C: 45.7 ml LV SV MOD A2C:     48.9 ml LV SV MOD A4C:     108.0 ml LV SV MOD BP:      55.7 ml RIGHT VENTRICLE          IVC RV Basal diam:  2.60 cm  IVC diam: 1.50 cm LEFT ATRIUM             Index LA diam:        3.00 cm 1.54 cm/m LA Vol (A2C):   54.5 ml 28.05 ml/m LA Vol (A4C):   63.3 ml 32.58 ml/m LA Biplane Vol: 59.3 ml 30.52 ml/m  AORTIC VALVE AV Area (Vmax):    2.33 cm AV Area (Vmean):   2.31 cm AV Area (VTI):     2.57 cm AV Vmax:           159.00 cm/s AV Vmean:          106.000 cm/s AV VTI:            0.307 m AV Peak Grad:      10.1 mmHg AV Mean Grad:      5.0 mmHg LVOT Vmax:         118.00 cm/s LVOT Vmean:        77.800 cm/s LVOT VTI:          0.251 m LVOT/AV VTI ratio: 0.82  AORTA Ao Root diam: 2.70 cm Ao Asc diam:  2.50 cm MITRAL VALVE MV Area (PHT): 2.48 cm     SHUNTS MV Decel Time: 306 msec     Systemic VTI:  0.25 m MV E velocity: 55.20 cm/s   Systemic Diam: 2.00 cm MV A velocity: 113.00 cm/s MV E/A ratio:  0.49 Sheryle Donning MD Electronically signed by Sheryle Donning MD Signature Date/Time: 11/30/2023/5:54:54 PM    Final    VAS US  CAROTID Result Date: 11/30/2023 Carotid Arterial Duplex Study Patient Name:  NIKOLOZ HUY  Date of Exam:   11/30/2023 Medical Rec #:  [id number]          Accession #:    [id number] Date of Birth: May 08, 1930          Patient Gender: M Patient Age:   27 years Exam Location:  [facility] Procedure:      VAS US  CAROTID Referring Phys: Emeterio Hansen OPYD --------------------------------------------------------------------------------  Indications:       Transient neurologic symptoms. Risk Factors:      Hypertension, hyperlipidemia, Diabetes, no history of                    smoking. Other Factors:     CHF. Comparison Study:  No previous exams Performing Technologist: Jody Hill RVT, RDMS  Examination  Guidelines: A complete evaluation includes B-mode imaging, spectral Doppler, color Doppler, and power Doppler as needed of all accessible portions of each vessel. Bilateral testing is considered an integral part of a complete examination. Limited examinations for reoccurring indications may be performed as noted.  Right Carotid Findings: +----------+--------+--------+--------+------------------+--------+           PSV cm/sEDV cm/sStenosisPlaque DescriptionComments +----------+--------+--------+--------+------------------+--------+ CCA Prox  52      5                                          +----------+--------+--------+--------+------------------+--------+ CCA Distal50      5                                          +----------+--------+--------+--------+------------------+--------+ ICA Prox  36      6                                          +----------+--------+--------+--------+------------------+--------+ ICA Distal69      13                                         +----------+--------+--------+--------+------------------+--------+ ECA       122                                       tortuous +----------+--------+--------+--------+------------------+--------+ +----------+--------+-------+----------------+-------------------+           PSV cm/sEDV cmsDescribe        Arm Pressure (mmHG)  +----------+--------+-------+----------------+-------------------+ [id number]             Multiphasic, WNL                    +----------+--------+-------+----------------+-------------------+ +---------+--------+--+--------+-+---------+ VertebralPSV cm/s39EDV cm/s7Antegrade +---------+--------+--+--------+-+---------+  Left Carotid Findings: +----------+--------+--------+--------+------------------+--------+           PSV cm/sEDV cm/sStenosisPlaque DescriptionComments +----------+--------+--------+--------+------------------+--------+ CCA Prox  77      12                                         +----------+--------+--------+--------+------------------+--------+ CCA Distal66      7                                          +----------+--------+--------+--------+------------------+--------+ ICA Prox  63      13              focal and calcific         +----------+--------+--------+--------+------------------+--------+ ICA Distal59      13                                         +----------+--------+--------+--------+------------------+--------+ ECA       103     0  focal and calcific         +----------+--------+--------+--------+------------------+--------+ +----------+--------+--------+----------------+-------------------+           PSV cm/sEDV cm/sDescribe        Arm Pressure (mmHG) +----------+--------+--------+----------------+-------------------+ [id number]              Multiphasic, WNL                    +----------+--------+--------+----------------+-------------------+ +---------+--------+--+--------+-+---------+ VertebralPSV cm/s47EDV cm/s9Antegrade +---------+--------+--+--------+-+---------+   Summary: Right Carotid: The extracranial vessels were near-normal with only minimal wall                thickening or plaque. Left Carotid: The extracranial vessels were near-normal with only minimal wall                thickening or plaque. Vertebrals:  Bilateral vertebral arteries demonstrate antegrade flow. Subclavians: Normal flow hemodynamics were seen in bilateral subclavian              arteries. *See table(s) above for measurements and observations.  Electronically signed by Jimmye Moulds MD on 11/30/2023 at 2:30:11 PM.    Final    CT Angio Chest PE W/Cm &/Or Wo Cm Result Date: 11/30/2023 CLINICAL DATA:  Shortness of breath EXAM: CT ANGIOGRAPHY CHEST WITH CONTRAST TECHNIQUE: Multidetector CT imaging of the chest was performed using the standard protocol during bolus administration of intravenous contrast. Multiplanar CT image reconstructions and MIPs were obtained to evaluate the vascular anatomy. RADIATION DOSE REDUCTION: This exam was performed according to the departmental dose-optimization program which includes automated exposure control, adjustment of the mA and/or kV according to patient size and/or use of iterative reconstruction technique. CONTRAST:  75mL OMNIPAQUE  IOHEXOL  350 MG/ML SOLN COMPARISON:  None Available. FINDINGS: Cardiovascular: Atherosclerotic calcifications of the thoracic aorta are noted. The degree of opacification is limited. Heavy coronary calcifications are noted. Mild cardiac enlargement is seen. Pulmonary artery shows a normal branching pattern. No filling defect to suggest pulmonary embolism is noted. Mediastinum/Nodes: Thoracic inlet is within normal limits. Scattered small mediastinal lymph nodes are noted likely reactive in nature. The esophagus is within normal limits. Lungs/Pleura: Lungs are well aerated bilaterally. No focal infiltrate or sizable effusion is seen. No pneumothorax is noted. Scattered nodules are noted throughout both lungs measuring 5 mm and less. No follow-up is recommended. Upper Abdomen: Visualized upper abdomen shows no acute abnormality. Musculoskeletal: No acute bony abnormality is noted. Review of the MIP images confirms the above findings. IMPRESSION: No  evidence of pulmonary emboli. Multiple pulmonary nodules. Most significant: Solid pulmonary nodule measuring 5 mm. Per Fleischner Society Guidelines, no routine follow-up imaging is recommended. These guidelines do not apply to immunocompromised patients and patients with cancer. Follow up in patients with significant comorbidities as clinically warranted. For lung cancer screening, adhere to Lung-RADS guidelines. Reference: Radiology. 2017; 284(1):228-43. Aortic Atherosclerosis (ICD10-I70.0). Electronically Signed   By: Violeta Grey M.D.   On: 11/30/2023 01:03   MR BRAIN WO CONTRAST Result Date: 11/29/2023 CLINICAL DATA:  Initial evaluation for acute neuro deficit, stroke suspected. EXAM: MRI HEAD WITHOUT CONTRAST TECHNIQUE: Multiplanar, multiecho pulse sequences of the brain and surrounding structures were obtained without intravenous contrast. COMPARISON:  CT from earlier the same day. FINDINGS: Brain: Diffuse prominence of the CSF containing spaces compatible generalized cerebral atrophy. Patchy T2/FLAIR hyperintensity involving the periventricular and deep white matter both cerebral hemispheres as well as the pons, consistent chronic small vessel ischemic disease, mild to moderate for age. No evidence for acute or subacute infarct. No  areas of chronic cortical infarction. No acute or chronic intracranial blood products. No mass lesion, midline shift or mass effect. Ventricular prominence related to global parenchymal volume loss without hydrocephalus. No extra-axial fluid collection. Pituitary gland within normal limits. Vascular: Major intracranial vascular flow voids are maintained. Skull and upper cervical spine: Craniocervical junction within normal limits. Bone marrow signal intensity normal. No scalp soft tissue abnormality. Sinuses/Orbits: Prior bilateral ocular lens replacement. Paranasal sinuses are largely clear. No significant mastoid effusion. Other: None. IMPRESSION: 1. No acute intracranial  abnormality. 2. Generalized age-related cerebral atrophy with mild to moderate chronic small vessel ischemic disease. Electronically Signed   By: Virgia Griffins M.D.   On: 11/29/2023 20:36   CT HEAD WO CONTRAST Result Date: 11/29/2023 CLINICAL DATA:  Altered mental status EXAM: CT HEAD WITHOUT CONTRAST TECHNIQUE: Contiguous axial images were obtained from the base of the skull through the vertex without intravenous contrast. RADIATION DOSE REDUCTION: This exam was performed according to the departmental dose-optimization program which includes automated exposure control, adjustment of the mA and/or kV according to patient size and/or use of iterative reconstruction technique. COMPARISON:  12/09/2017 FINDINGS: Brain: There is no mass, hemorrhage or extra-axial collection. There is generalized atrophy without lobar predilection. Hypodensity of the white matter is most commonly associated with chronic microvascular disease. Vascular: Atherosclerotic calcification of the vertebral and internal carotid arteries at the skull base. No abnormal hyperdensity of the major intracranial arteries or dural venous sinuses. Skull: The visualized skull base, calvarium and extracranial soft tissues are normal. Sinuses/Orbits: No fluid levels or advanced mucosal thickening of the visualized paranasal sinuses. No mastoid or middle ear effusion. Normal orbits. Other: None. IMPRESSION: 1. No acute intracranial abnormality. 2. Generalized atrophy and findings of chronic microvascular disease. Electronically Signed   By: Juanetta Nordmann M.D.   On: 11/29/2023 18:37    Microbiology: Results for orders placed or performed in visit on 09/04/18  Novel Coronavirus, NAA ([facility])  Drive up testing site only     Status: None   Collection Time: 09/04/18  2:44 PM  Result Value Ref Range Status   SARS-CoV-2, NAA Not Detected Not Detected Final    Comment: This test was developed and its performance characteristics determined by  [employer]. This test has not been FDA cleared or approved. This test has been authorized by FDA under an Emergency Use Authorization (EUA). This test has been validated in accordance with the FDA's Guidance Document (Policy for Diagnostics Testing in Laboratories Certified to Perform High Complexity Testing under CLIA prior to Emergency Use Authorization for Coronavirus Disease-2019 during the [facility] Emergency) issued on February 29th, 2020. FDA independent review of this validation is pending. This test is only authorized for the duration of time the declaration that circumstances exist justifying the authorization of the emergency use of in vitro diagnostic tests for detection of SARS-CoV-2 virus and/or diagnosis of COVID-19 infection under section 564(b)(1) of the Act, 21 U.S.C. 409WJX-9(J)(4), unless the authorization is terminated or revoked sooner.     Labs: CBC: Recent Labs  Lab 11/29/23 1746 11/29/23 1812 11/30/23 0414 12/01/23 0502  WBC 5.5  --  5.2 5.3  NEUTROABS 3.6  --   --   --   HGB 15.1 15.3 13.8 14.0  HCT 44.2 45.0 41.3 41.7  MCV 93.6  --  94.1 93.1  PLT 197  --  155 167   Basic Metabolic Panel: Recent Labs  Lab 11/29/23 1746 11/29/23 1812 11/30/23 0414 12/01/23 0502  NA 137 139  136 138  K 3.5 3.5 3.3* 3.7  CL 102 99 102 103  CO2 25  --  24 23  GLUCOSE 213* 214* 131* 165*  BUN 15 19 13 18   CREATININE 0.85 0.90 0.80 0.95  CALCIUM  9.1  --  8.9 8.9  MG 2.1  --  1.9 1.9   Liver Function Tests: Recent Labs  Lab 11/29/23 1746 12/01/23 0502  AST 36 31  ALT 32 27  ALKPHOS 90 70  BILITOT 0.9 1.3*  PROT 8.0 6.9  ALBUMIN 4.2 3.7   CBG: Recent Labs  Lab 11/30/23 1116 11/30/23 1622 11/30/23 2132 12/01/23 0900 12/01/23 1209  GLUCAP 143* 212* 196* 175* 180*    Discharge time spent: 35 minutes.  Length of inpatient stay: 0 days  Signed: Jodeane Mulligan, DO [facility] 12/01/2023

## 2023-12-01 NOTE — Progress Notes (Signed)
 PHARMACY - ANTICOAGULATION CONSULT NOTE  Pharmacy Consult for heparin  Indication: chest pain/ACS  Allergies  Allergen Reactions   Enalapril Maleate Other (See Comments)    Swelling **Vasotec**   Losartan  Other (See Comments)    Makes him feel jittery   Menthol Itching   Shellfish Allergy    Sulfa Antibiotics Other (See Comments)    Doesn't recall reaction   Sulfamethoxazole Other (See Comments)    Doesn't recall reaction    Patient Measurements: Height: 5' 11 (180.3 cm) Weight: 74.8 kg (165 lb) IBW/kg (Calculated) : 75.3 HEPARIN  DW (KG): 74.8  Vital Signs: Temp: 98.4 F (36.9 C) (06/13 0550) Temp Source: Oral (06/13 0550) BP: 155/72 (06/13 0550) Pulse Rate: 61 (06/13 0550)  Labs: Recent Labs    11/29/23 1746 11/29/23 1812 11/29/23 2210 11/30/23 0231 11/30/23 0414 11/30/23 1436 12/01/23 0037 12/01/23 0502  HGB 15.1 15.3  --   --  13.8  --   --  14.0  HCT 44.2 45.0  --   --  41.3  --   --  41.7  PLT 197  --   --   --  155  --   --  167  APTT 31  --   --   --   --   --   --   --   LABPROT 13.7  --   --   --   --   --   --  15.0  INR 1.0  --   --   --   --   --   --  1.2  HEPARINUNFRC  --   --   --   --   --  0.15* 0.45 0.47  CREATININE 0.85 0.90  --   --  0.80  --   --  0.95  TROPONINIHS 53*  --  789* 1,143*  --   --   --   --     Estimated Creatinine Clearance (by C-G formula based on SCr of 0.95 mg/dL) Male: 16.1 mL/min Male: 52.5 mL/min   Medical History: Past Medical History:  Diagnosis Date   Asymptomatic PVCs    Diabetes mellitus    well controlled   Diastolic dysfunction    Hyperlipidemia    Hypertension    Mitral regurgitation    mild    Medications:  No current facility-administered medications on file prior to encounter.   Current Outpatient Medications on File Prior to Encounter  Medication Sig Dispense Refill   amLODipine  (NORVASC ) 10 MG tablet Take 1 tablet (10 mg total) by mouth daily. 90 tablet 3   aspirin  81 MG chewable  tablet Chew 1 tablet (81 mg total) by mouth daily. 60 tablet 1   atorvastatin  (LIPITOR) 10 MG tablet TAKE ONE-HALF TABLET BY MOUTH DAILY 15 tablet 0   carvedilol  (COREG ) 6.25 MG tablet TAKE ONE TABLET BY MOUTH TWICE A DAY WITH A MEAL 30 tablet 0   Cholecalciferol  (VITAMIN D-3) 1000 UNITS CAPS Take 1,000 Units by mouth daily.     Cyanocobalamin  (VITAMIN B 12 PO) Take 1,000 mcg by mouth daily.      fexofenadine (ALLEGRA) 180 MG tablet Take 180 mg by mouth daily.     glipiZIDE  (GLUCOTROL ) 5 MG 24 hr tablet Take 5 mg by mouth daily.     MAGNESIUM  PO Take 400 mg by mouth daily.     naproxen sodium (ALEVE) 220 MG tablet Take 220 mg by mouth daily as needed (for pain).     pyridOXINE  (VITAMIN  B6) 25 MG tablet Take 25 mg by mouth daily.     tamsulosin  (FLOMAX ) 0.4 MG CAPS capsule TAKE ONE CAPSULE (0.4MG  TOTAL) BY MOUTH DAILY 90 capsule 3   ACCU-CHEK AVIVA PLUS test strip      ACCU-CHEK SOFTCLIX LANCETS lancets      Blood Glucose Monitoring Suppl (ACCU-CHEK AVIVA PLUS) W/DEVICE KIT        Assessment: 89 y.o. adult with elevated troponin for heparin   Heparin  level is therapeutic at 0.47 on UFH IV infusion at 1100 units/hour. CBC is stable, no signs of bleeding.  Goal of Therapy:  Heparin  level 0.3-0.7 units/ml Monitor platelets by anticoagulation protocol: Yes   Plan:  Continue UFH IV 1100 units/hour Daily CBC F/u plans after LHC  Albino Alu, PharmD PGY2 Cardiology Pharmacy Resident 12/01/2023,6:49 AM  Please check AMION for all [facility] Pharmacy phone numbers After 10:00 PM, call Main Pharmacy [phone]

## 2023-12-01 NOTE — TOC CM/SW Note (Signed)
 Transition of Care [facility]) - Inpatient Brief Assessment   Patient Details  Name: Shawn Stephenson MRN: [id number] Date of Birth: July 25, 1929  Transition of Care [facility]) CM/SW Contact:    Cosimo Diones, RN Phone Number: 12/01/2023, 12:46 PM   Clinical Narrative: Patient presented for headache and confusion-neurological symptoms have resolved. Cardiology is following and plan for LHC today. PTA patient states he was from home with spouse and has support of daughter. Patient has DME: cane and rolling walker. Patient states he has a PCP and gets to appointments without any issues. Case Manager will continue to follow for transition of care needs as the patient progresses.     Transition of Care Asessment: Insurance and Status: Insurance coverage has been reviewed Patient has primary care physician: Yes Home environment has been reviewed: reviewed Prior level of function:: patient states he is independent has DME: cane and rolling walker. Prior/Current Home Services: No current home services Social Drivers of Health Review: SDOH reviewed no interventions necessary Readmission risk has been reviewed: Yes Transition of care needs: no transition of care needs at this time

## 2023-12-01 NOTE — Plan of Care (Signed)

## 2023-12-01 NOTE — Progress Notes (Signed)
 PHARMACY - ANTICOAGULATION  Pharmacy Consult for heparin  Indication: chest pain/ACS Brief A/P: Heparin  level within goal range Continue Heparin  at current rate   Allergies  Allergen Reactions   Enalapril Maleate Other (See Comments)    Swelling **Vasotec**   Losartan  Other (See Comments)    Makes him feel jittery   Menthol Itching   Shellfish Allergy    Sulfa Antibiotics Other (See Comments)    Doesn't recall reaction   Sulfamethoxazole Other (See Comments)    Doesn't recall reaction    Patient Measurements: Height: 5' 11 (180.3 cm) Weight: 74.8 kg (165 lb) IBW/kg (Calculated) : 75.3 HEPARIN  DW (KG): 74.8  Vital Signs: Temp: 97.8 F (36.6 C) (06/12 2316) Temp Source: Oral (06/12 2316) BP: 169/76 (06/12 2316) Pulse Rate: 65 (06/12 2316)  Labs: Recent Labs    11/29/23 1746 11/29/23 1812 11/29/23 2210 11/30/23 0231 11/30/23 0414 11/30/23 1436 12/01/23 0037  HGB 15.1 15.3  --   --  13.8  --   --   HCT 44.2 45.0  --   --  41.3  --   --   PLT 197  --   --   --  155  --   --   APTT 31  --   --   --   --   --   --   LABPROT 13.7  --   --   --   --   --   --   INR 1.0  --   --   --   --   --   --   HEPARINUNFRC  --   --   --   --   --  0.15* 0.45  CREATININE 0.85 0.90  --   --  0.80  --   --   TROPONINIHS 53*  --  789* 1,143*  --   --   --     Estimated Creatinine Clearance (by C-G formula based on SCr of 0.8 mg/dL) Male: 76.2 mL/min Male: 62.3 mL/min  Assessment: 89 y.o. adult with NSTEMI for heparin  Goal of Therapy:  Heparin  level 0.3-0.7 units/ml Monitor platelets by anticoagulation protocol: Yes   Plan:  No change to heparin    Kjersti Dittmer, Sharlyn Deaner 12/01/2023,1:20 AM

## 2023-12-01 NOTE — Plan of Care (Signed)
  Problem: Education: Goal: Ability to describe self-care measures that may prevent or decrease complications (Diabetes Survival Skills Education) will improve Outcome: Adequate for Discharge Goal: Individualized Educational Video(s) Outcome: Adequate for Discharge   Problem: Coping: Goal: Ability to adjust to condition or change in health will improve Outcome: Adequate for Discharge   Problem: Fluid Volume: Goal: Ability to maintain a balanced intake and output will improve Outcome: Adequate for Discharge   Problem: Health Behavior/Discharge Planning: Goal: Ability to identify and utilize available resources and services will improve Outcome: Adequate for Discharge Goal: Ability to manage health-related needs will improve Outcome: Adequate for Discharge   Problem: Metabolic: Goal: Ability to maintain appropriate glucose levels will improve Outcome: Adequate for Discharge   Problem: Nutritional: Goal: Maintenance of adequate nutrition will improve Outcome: Adequate for Discharge Goal: Progress toward achieving an optimal weight will improve Outcome: Adequate for Discharge   Problem: Skin Integrity: Goal: Risk for impaired skin integrity will decrease Outcome: Adequate for Discharge   Problem: Tissue Perfusion: Goal: Adequacy of tissue perfusion will improve Outcome: Adequate for Discharge   Problem: Education: Goal: Knowledge of General Education information will improve Description: Including pain rating scale, medication(s)/side effects and non-pharmacologic comfort measures Outcome: Adequate for Discharge   Problem: Health Behavior/Discharge Planning: Goal: Ability to manage health-related needs will improve Outcome: Adequate for Discharge   Problem: Clinical Measurements: Goal: Ability to maintain clinical measurements within normal limits will improve Outcome: Adequate for Discharge Goal: Will remain free from infection Outcome: Adequate for Discharge Goal:  Diagnostic test results will improve Outcome: Adequate for Discharge Goal: Respiratory complications will improve Outcome: Adequate for Discharge Goal: Cardiovascular complication will be avoided Outcome: Adequate for Discharge   Problem: Activity: Goal: Risk for activity intolerance will decrease Outcome: Adequate for Discharge   Problem: Nutrition: Goal: Adequate nutrition will be maintained Outcome: Adequate for Discharge   Problem: Coping: Goal: Level of anxiety will decrease Outcome: Adequate for Discharge   Problem: Elimination: Goal: Will not experience complications related to bowel motility Outcome: Adequate for Discharge Goal: Will not experience complications related to urinary retention Outcome: Adequate for Discharge   Problem: Pain Managment: Goal: General experience of comfort will improve and/or be controlled Outcome: Adequate for Discharge   Problem: Safety: Goal: Ability to remain free from injury will improve Outcome: Adequate for Discharge   Problem: Skin Integrity: Goal: Risk for impaired skin integrity will decrease Outcome: Adequate for Discharge   Problem: Education: Goal: Understanding of CV disease, CV risk reduction, and recovery process will improve Outcome: Adequate for Discharge Goal: Individualized Educational Video(s) Outcome: Adequate for Discharge   Problem: Activity: Goal: Ability to return to baseline activity level will improve Outcome: Adequate for Discharge   Problem: Cardiovascular: Goal: Ability to achieve and maintain adequate cardiovascular perfusion will improve Outcome: Adequate for Discharge Goal: Vascular access site(s) Level 0-1 will be maintained Outcome: Adequate for Discharge   Problem: Health Behavior/Discharge Planning: Goal: Ability to safely manage health-related needs after discharge will improve Outcome: Adequate for Discharge

## 2023-12-02 ENCOUNTER — Encounter (HOSPITAL_COMMUNITY): Payer: Self-pay | Admitting: Cardiovascular Disease

## 2023-12-08 DIAGNOSIS — Z8673 Personal history of transient ischemic attack (TIA), and cerebral infarction without residual deficits: Secondary | ICD-10-CM | POA: Diagnosis not present

## 2023-12-08 DIAGNOSIS — I2510 Atherosclerotic heart disease of native coronary artery without angina pectoris: Secondary | ICD-10-CM | POA: Diagnosis not present

## 2023-12-11 ENCOUNTER — Other Ambulatory Visit: Payer: Self-pay | Admitting: Cardiology

## 2023-12-11 DIAGNOSIS — M25561 Pain in right knee: Secondary | ICD-10-CM | POA: Diagnosis not present

## 2023-12-11 DIAGNOSIS — M25511 Pain in right shoulder: Secondary | ICD-10-CM | POA: Diagnosis not present

## 2023-12-12 ENCOUNTER — Telehealth: Payer: Self-pay | Admitting: Cardiology

## 2023-12-12 MED ORDER — CARVEDILOL 6.25 MG PO TABS: ORAL_TABLET | ORAL | 0 refills | Status: DC

## 2023-12-12 NOTE — Telephone Encounter (Signed)
*  STAT* If patient is at the pharmacy, call can be transferred to refill team.   1. Which medications need to be refilled? (please list name of each medication and dose if known) carvedilol  (COREG ) 6.25 MG tablet   2. Which pharmacy/location (including street and city if local pharmacy) is medication to be sent to?  [facility] - [place], [place] - [address]   3. Do they need a 30 day or 90 day supply? Enough to last to appt

## 2023-12-12 NOTE — Progress Notes (Signed)
 Cardiology Office Note   Date:  12/15/2023   ID:  Shawn Stephenson, DOB Dec 04, 1929, MRN [id number]  PCP:  Sheryle Carwin, MD  Cardiologist: Akram Kissick [place] MD  Chief Complaint  Patient presents with   Coronary Artery Disease       History of Present Illness: Shawn Stephenson is a 89 y.o. adult who presents for follow up PVCs and hypertensive heart disease. Last seen by me in Dec 2022.   He has a past history of frequent PVCs and a past history of hypertensive cardiovascular disease and a history of diastolic dysfunction of the left ventricle. He also has hypercholesterolemia. He is a mild diabetic. The patient had a Myoview stress test on 10/01/13 which showed no ischemia and showed a very small apical scar.  The study was not gated because of PVCs and PACs. His last echocardiogram was 02/10/14 and showed normal left ventricular systolic function with ejection fraction of 65%.  There was aortic valve sclerosis without stenosis. Most recent Echo in June 2019 showed focal basal septal hypertrophy otherwise normal LV.   His  Daughter Shawn Stephenson is a patient of mine and had septal myectomy therapy for hypertrophic obstructive cardiomyopathy done at [facility] clinic.   He was recently admitted to the hospital with confusion, HA, and slurred speech. He ruled in for NSTEMI with peak troponin 1,143. Ecg showed a RBBB, LAFB. No acute ST changes. Underwent cardiac cath showing critical subtotal occlusion of the ostial LAD. There was 90% stenosis in the mid LAD. The vessel was severely calcified. The angle was not favorable for PCI. He was not a surgical candidate given age. Echo showed normal LV function. He had no angina. Medical management recommended. Lipitor dose was increased and plavix  added.   On follow up today he feels well. Denies any chest pain or SOB. Activity is fairly limited. Does some low level tasks at home like doing the dishes. Does not walk regularly. Some fatigue if he lifts much.  Notes BP much better at home   Past Medical History:  Diagnosis Date   Asymptomatic PVCs    Diabetes mellitus    well controlled   Diastolic dysfunction    Hyperlipidemia    Hypertension    Mitral regurgitation    mild    Past Surgical History:  Procedure Laterality Date   INTRAMEDULLARY (IM) NAIL INTERTROCHANTERIC Right 12/09/2017   Procedure: INTRAMEDULLARY (IM) NAIL INTERTROCHANTRIC;  Surgeon: Fidel Rogue, MD;  Location: [facility] OR;  Service: Orthopedics;  Laterality: Right;   LEFT HEART CATH AND CORONARY ANGIOGRAPHY N/A 12/01/2023   Procedure: LEFT HEART CATH AND CORONARY ANGIOGRAPHY;  Surgeon: Verlin Lonni BIRCH, MD;  Location: [facility] INVASIVE CV LAB;  Service: Cardiovascular;  Laterality: N/A;     Current Outpatient Medications  Medication Sig Dispense Refill   ACCU-CHEK AVIVA PLUS test strip      ACCU-CHEK SOFTCLIX LANCETS lancets      amLODipine  (NORVASC ) 10 MG tablet Take 1 tablet (10 mg total) by mouth daily. 90 tablet 3   aspirin  81 MG chewable tablet Chew 1 tablet (81 mg total) by mouth daily. 60 tablet 1   Blood Glucose Monitoring Suppl (ACCU-CHEK AVIVA PLUS) W/DEVICE KIT      Cholecalciferol  (VITAMIN D-3) 1000 UNITS CAPS Take 1,000 Units by mouth daily.     clopidogrel  (PLAVIX ) 75 MG tablet Take 1 tablet (75 mg total) by mouth daily. 90 tablet 3   Cyanocobalamin  (VITAMIN B 12 PO) Take 1,000 mcg by mouth daily.  fexofenadine (ALLEGRA) 180 MG tablet Take 180 mg by mouth daily.     glipiZIDE  (GLUCOTROL ) 5 MG 24 hr tablet Take 5 mg by mouth daily.     MAGNESIUM  PO Take 400 mg by mouth daily.     nitroGLYCERIN (NITROSTAT) 0.4 MG SL tablet Place 1 tablet (0.4 mg total) under the tongue every 5 (five) minutes as needed for chest pain. 90 tablet 3   pyridOXINE  (VITAMIN B6) 25 MG tablet Take 25 mg by mouth daily.     tamsulosin  (FLOMAX ) 0.4 MG CAPS capsule TAKE ONE CAPSULE (0.4MG  TOTAL) BY MOUTH DAILY 90 capsule 3   atorvastatin  (LIPITOR) 40 MG tablet Take 1 tablet (40  mg total) by mouth daily. 90 tablet 3   carvedilol  (COREG ) 6.25 MG tablet TAKE ONE TABLET BY MOUTH TWICE A DAY WITH A MEAL 60 tablet 3   No current facility-administered medications for this visit.    Allergies:   Enalapril maleate, Losartan , Menthol, Shellfish allergy, Sulfasalazine, Sulfa antibiotics, and Sulfamethoxazole    Social History:  The patient  reports that he has never smoked. He has never used smokeless tobacco. He reports that he does not drink alcohol and does not use drugs.   Family History:  The patient's family history includes Arthritis in his mother; Diabetes in his father; Hypertension in his brother, father, and mother; Kidney disease in his brother; Stroke in his brother and father.    ROS:  Please see the history of present illness.   Otherwise, review of systems are positive for none.   All other systems are reviewed and negative.    PHYSICAL EXAM: VS:  BP (!) 171/81 (BP Location: Left Arm, Patient Position: Sitting, Cuff Size: Normal)   Pulse 69   Ht 5' 11.5 (1.816 m)   Wt 160 lb (72.6 kg)   SpO2 94%   BMI 22.00 kg/m  , BMI Body mass index is 22 kg/m. GENERAL:  Well appearing elderly WM in NAD HEENT:  PERRL, EOMI, sclera are clear. Oropharynx is clear. NECK:  No jugular venous distention, carotid upstroke brisk and symmetric, no bruits, no thyromegaly or adenopathy LUNGS:  Clear to auscultation bilaterally CHEST:  Unremarkable HEART:  RRR,  PMI not displaced or sustained,S1 and S2 within normal limits, no S3, no S4: no clicks, no rubs, no murmurs ABD:  Soft, nontender. BS +, no masses or bruits. No hepatomegaly, no splenomegaly EXT:  2 + pulses throughout, no edema, no cyanosis no clubbing SKIN:  Warm and dry.  No rashes NEURO:  Alert and oriented x 3. Cranial nerves II through XII intact. PSYCH:  Cognitively intact   Recent Labs: 12/01/2023: ALT 27; BUN 18; Creatinine, Ser 0.95; Hemoglobin 14.0; Magnesium  1.9; Platelets 167; Potassium 3.7; Sodium  138    Lipid Panel    Component Value Date/Time   CHOL 139 11/30/2023 0231   TRIG 57 11/30/2023 0231   HDL 46 11/30/2023 0231   CHOLHDL 3.0 11/30/2023 0231   VLDL 11 11/30/2023 0231   LDLCALC 82 11/30/2023 0231      Wt Readings from Last 3 Encounters:  12/15/23 160 lb (72.6 kg)  11/29/23 165 lb (74.8 kg)  05/28/21 171 lb 3.2 oz (77.7 kg)    Labs reviewed from 12/28/15: cholesterol 149, triglycerides 74, LDL 88, HDL 46. Glucose 159, A1c 6.4%. Other chemistries, CBC, UA normal. Dated 02/03/17: cholesterol 134, triglycerides 77, HDL 41. A1c 6.7%. CBC and chemistries normal.  Dated 01/08/18: normal chemistries Dated 02/23/18 normal Hgb Dated 07/06/18 A1c  7.1%.  Dated 04/09/20: A1c 7.4%. cholesterol 146, triglycerides 79, HDL 46, LDL 78. Glucose 188. Potassium 3.4. otherwise CMET and CBC normal. Dated 12/22/20: a1C 7.1%.   Echo 12/10/17: Study Conclusions   - Left ventricle: The cavity size was normal. There was moderate   focal basal hypertrophy of the septum. Systolic function was   vigorous. The estimated ejection fraction was in the range of 65%   to 70%. Wall motion was normal; there were no regional wall   motion abnormalities. Doppler parameters are consistent with   abnormal left ventricular relaxation (grade 1 diastolic   dysfunction). - Aortic valve: Trileaflet; mildly thickened, mildly calcified   leaflets. There was mild regurgitation.  Echo 11/30/23: IMPRESSIONS     1. Left ventricular ejection fraction, by estimation, is 55 to 60%. The  left ventricle has normal function. The left ventricle has no regional  wall motion abnormalities. There is moderate asymmetric left ventricular  hypertrophy of the basal-septal  segment. Left ventricular diastolic parameters were normal.   2. Right ventricular systolic function is normal. The right ventricular  size is normal.   3. Left atrial size was mildly dilated.   4. The mitral valve is grossly normal. Trivial mitral valve   regurgitation. No evidence of mitral stenosis.   5. The aortic valve is grossly normal. There is mild calcification of the  aortic valve. There is mild thickening of the aortic valve. Aortic valve  regurgitation is trivial. Aortic valve sclerosis/calcification is present,  without any evidence of aortic  stenosis.   6. The inferior vena cava is normal in size with greater than 50%  respiratory variability, suggesting right atrial pressure of 3 mmHg.   Comparison(s): Changes from prior study are noted. EF no longer  hyperdynamic compared to prior but still within normal range.   Conclusion(s)/Recommendation(s): Normal biventricular function without  evidence of hemodynamically significant valvular heart disease.   Cardiac cath 12/01/23:  LEFT HEART CATH AND CORONARY ANGIOGRAPHY   Conclusion      Ost LAD to Prox LAD lesion is 99% stenosed.   Mid LAD lesion is 90% stenosed.   Prox LAD to Mid LAD lesion is 30% stenosed.   Mid RCA to Dist RCA lesion is 40% stenosed.   RPDA lesion is 50% stenosed.   Prox Cx to Mid Cx lesion is 30% stenosed.   Severe, heavily calcified stenosis in the ostial LAD. The lesion is essentially a flush sub-total occlusion with a micro channel. Severe calcification throughout the proximal and mid LAD.  The Circumflex and RCA have mild non-obstructive disease Normal LVEDP    Recommendations: He has complex, heavily calcified disease in the ostium of the LAD. This lesion is not favorable for PCI. He has no concerning symptoms suggestive of angina. His troponin was elevated but his presentation was with neuro symptoms. He has normal LV function. He is not a candidate for CABG given advanced age. I have reviewed his case with the IC team. Given advanced age and other findings as above, will take a conservative approach to management. Will add Plavix . He can be discharged today. If he has lifestyle limiting angina, can consider PCI of the LAD.   Coronary  Diagrams  Diagnostic Dominance: Right  Intervention  ASSESSMENT AND PLAN:  1. CAD with recent NSTEMI with critical ostial LAD stenosis. Not favorable for PCI. Not a surgical candidate. Fortunately not having angina. Will manage medically with ASA, plavix  for on year, amlodipien and Coreg . Given Rx for Ntg prn.  Follow up in 4 months.  2. Hypertensive heart disease without heart failure. BP is generally well controlled.  Asked him to notify me if staying high so we can adjust therapy 2. Diastolic dysfunction 3. PVCs-no symptoms 4. Aortic valve disease with mild aortic insufficiency 5. HLD lipitor dose increased to 40 mg daily  Follow up in 4 months with repeat lab   Signed, Alexy Bringle [place] MD, FACC   12/15/2023 11:33 AM    [facility]

## 2023-12-12 NOTE — Telephone Encounter (Signed)
 RX sent to requested Pharmacy

## 2023-12-15 ENCOUNTER — Encounter: Payer: Self-pay | Admitting: Cardiology

## 2023-12-15 ENCOUNTER — Ambulatory Visit: Attending: Cardiology | Admitting: Cardiology

## 2023-12-15 VITALS — BP 171/81 | HR 69 | Ht 71.5 in | Wt 160.0 lb

## 2023-12-15 DIAGNOSIS — I214 Non-ST elevation (NSTEMI) myocardial infarction: Secondary | ICD-10-CM | POA: Diagnosis not present

## 2023-12-15 DIAGNOSIS — I25118 Atherosclerotic heart disease of native coronary artery with other forms of angina pectoris: Secondary | ICD-10-CM | POA: Diagnosis not present

## 2023-12-15 DIAGNOSIS — E7800 Pure hypercholesterolemia, unspecified: Secondary | ICD-10-CM | POA: Diagnosis not present

## 2023-12-15 DIAGNOSIS — I10 Essential (primary) hypertension: Secondary | ICD-10-CM | POA: Diagnosis not present

## 2023-12-15 MED ORDER — NITROGLYCERIN 0.4 MG SL SUBL: 0.4000 mg | SUBLINGUAL_TABLET | SUBLINGUAL | 3 refills | Status: AC | PRN

## 2023-12-15 MED ORDER — CARVEDILOL 6.25 MG PO TABS: ORAL_TABLET | ORAL | 3 refills | Status: DC

## 2023-12-15 MED ORDER — ATORVASTATIN CALCIUM 40 MG PO TABS: 40.0000 mg | ORAL_TABLET | Freq: Every day | ORAL | 3 refills | Status: AC

## 2023-12-15 NOTE — Patient Instructions (Addendum)
 Medication Instructions:  Continue same medications *If you need a refill on your cardiac medications before your next appointment, please call your pharmacy*  Lab Work: Have fasting cmet,lipid panel before Oct appointment   Testing/Procedures: None ordered  Follow-Up: At [facility], you and your health needs are our priority.  As part of our continuing mission to provide you with exceptional heart care, our providers are all part of one team.  This team includes your primary Cardiologist (physician) and Advanced Practice Providers or APPs (Physician Assistants and Nurse Practitioners) who all work together to provide you with the care you need, when you need it.  Your next appointment:  4 months     Monday 10/6 at 11:00 am    Provider:  Dr.Jordan   We recommend signing up for the patient portal called MyChart.  Sign up information is provided on this After Visit Summary.  MyChart is used to connect with patients for Virtual Visits (Telemedicine).  Patients are able to view lab/test results, encounter notes, upcoming appointments, etc.  Non-urgent messages can be sent to your provider as well.   To learn more about what you can do with MyChart, go to [email or web address].

## 2023-12-28 DIAGNOSIS — E119 Type 2 diabetes mellitus without complications: Secondary | ICD-10-CM | POA: Diagnosis not present

## 2023-12-28 DIAGNOSIS — H35363 Drusen (degenerative) of macula, bilateral: Secondary | ICD-10-CM | POA: Diagnosis not present

## 2023-12-28 DIAGNOSIS — H353132 Nonexudative age-related macular degeneration, bilateral, intermediate dry stage: Secondary | ICD-10-CM | POA: Diagnosis not present

## 2024-02-01 DIAGNOSIS — I10 Essential (primary) hypertension: Secondary | ICD-10-CM | POA: Diagnosis not present

## 2024-02-01 DIAGNOSIS — E785 Hyperlipidemia, unspecified: Secondary | ICD-10-CM | POA: Diagnosis not present

## 2024-02-01 DIAGNOSIS — Z79899 Other long term (current) drug therapy: Secondary | ICD-10-CM | POA: Diagnosis not present

## 2024-02-01 DIAGNOSIS — R809 Proteinuria, unspecified: Secondary | ICD-10-CM | POA: Diagnosis not present

## 2024-02-01 DIAGNOSIS — E1129 Type 2 diabetes mellitus with other diabetic kidney complication: Secondary | ICD-10-CM | POA: Diagnosis not present

## 2024-02-03 LAB — LAB REPORT - SCANNED
A1c: 8.4
Albumin, Urine POC: 101
Creatinine, POC: 67.6 mg/dL
EGFR: 82
Microalb Creat Ratio: 149

## 2024-02-08 ENCOUNTER — Encounter: Payer: Self-pay | Admitting: Internal Medicine

## 2024-02-08 DIAGNOSIS — Z79899 Other long term (current) drug therapy: Secondary | ICD-10-CM | POA: Diagnosis not present

## 2024-02-08 DIAGNOSIS — Z8673 Personal history of transient ischemic attack (TIA), and cerebral infarction without residual deficits: Secondary | ICD-10-CM | POA: Diagnosis not present

## 2024-02-08 DIAGNOSIS — R808 Other proteinuria: Secondary | ICD-10-CM | POA: Diagnosis not present

## 2024-02-08 DIAGNOSIS — E1122 Type 2 diabetes mellitus with diabetic chronic kidney disease: Secondary | ICD-10-CM | POA: Diagnosis not present

## 2024-02-08 DIAGNOSIS — I10 Essential (primary) hypertension: Secondary | ICD-10-CM | POA: Diagnosis not present

## 2024-02-08 DIAGNOSIS — I2510 Atherosclerotic heart disease of native coronary artery without angina pectoris: Secondary | ICD-10-CM | POA: Diagnosis not present

## 2024-02-08 DIAGNOSIS — E785 Hyperlipidemia, unspecified: Secondary | ICD-10-CM | POA: Diagnosis not present

## 2024-03-16 NOTE — Progress Notes (Deleted)
 Cardiology Office Note   Date:  03/16/2024   ID:  Shawn Stephenson, DOB 1930/03/31, MRN [id number]  PCP:  Sheryle Carwin, MD  Cardiologist: Lummie Montijo [place] MD  No chief complaint on file.      History of Present Illness: Shawn Stephenson is a 89 y.o. adult who presents for follow up PVCs and hypertensive heart disease. Last seen by me in Dec 2022.   He has a past history of frequent PVCs and a past history of hypertensive cardiovascular disease and a history of diastolic dysfunction of the left ventricle. He also has hypercholesterolemia. He is a mild diabetic. The patient had a Myoview stress test on 10/01/13 which showed no ischemia and showed a very small apical scar.  The study was not gated because of PVCs and PACs. His last echocardiogram was 02/10/14 and showed normal left ventricular systolic function with ejection fraction of 65%.  There was aortic valve sclerosis without stenosis. Most recent Echo in June 2019 showed focal basal septal hypertrophy otherwise normal LV.   His  Daughter Darice Riles is a patient of mine and had septal myectomy therapy for hypertrophic obstructive cardiomyopathy done at [facility] clinic.   He was recently admitted to the hospital with confusion, HA, and slurred speech. He ruled in for NSTEMI with peak troponin 1,143. Ecg showed a RBBB, LAFB. No acute ST changes. Underwent cardiac cath showing critical subtotal occlusion of the ostial LAD. There was 90% stenosis in the mid LAD. The vessel was severely calcified. The angle was not favorable for PCI. He was not a surgical candidate given age. Echo showed normal LV function. He had no angina. Medical management recommended. Lipitor dose was increased and plavix  added.   On follow up today he feels well. Denies any chest pain or SOB. Activity is fairly limited. Does some low level tasks at home like doing the dishes. Does not walk regularly. Some fatigue if he lifts much. Notes BP much better at home   Past  Medical History:  Diagnosis Date   Asymptomatic PVCs    Diabetes mellitus    well controlled   Diastolic dysfunction    Hyperlipidemia    Hypertension    Mitral regurgitation    mild    Past Surgical History:  Procedure Laterality Date   INTRAMEDULLARY (IM) NAIL INTERTROCHANTERIC Right 12/09/2017   Procedure: INTRAMEDULLARY (IM) NAIL INTERTROCHANTRIC;  Surgeon: Fidel Rogue, MD;  Location: [facility] OR;  Service: Orthopedics;  Laterality: Right;   LEFT HEART CATH AND CORONARY ANGIOGRAPHY N/A 12/01/2023   Procedure: LEFT HEART CATH AND CORONARY ANGIOGRAPHY;  Surgeon: Verlin Lonni BIRCH, MD;  [facility]: [facility];  Service: Cardiovascular;  Laterality: N/A;     Current Outpatient Medications  Medication Sig Dispense Refill   ACCU-CHEK AVIVA PLUS test strip      ACCU-CHEK SOFTCLIX LANCETS lancets      amLODipine  (NORVASC ) 10 MG tablet Take 1 tablet (10 mg total) by mouth daily. 90 tablet 3   aspirin  81 MG chewable tablet Chew 1 tablet (81 mg total) by mouth daily. 60 tablet 1   atorvastatin  (LIPITOR) 40 MG tablet Take 1 tablet (40 mg total) by mouth daily. 90 tablet 3   Blood Glucose Monitoring Suppl (ACCU-CHEK AVIVA PLUS) W/DEVICE KIT      carvedilol  (COREG ) 6.25 MG tablet TAKE ONE TABLET BY MOUTH TWICE A DAY WITH A MEAL 60 tablet 3   Cholecalciferol  (VITAMIN D-3) 1000 UNITS CAPS Take 1,000 Units by mouth daily.  clopidogrel  (PLAVIX ) 75 MG tablet Take 1 tablet (75 mg total) by mouth daily. 90 tablet 3   Cyanocobalamin  (VITAMIN B 12 PO) Take 1,000 mcg by mouth daily.      fexofenadine (ALLEGRA) 180 MG tablet Take 180 mg by mouth daily.     glipiZIDE  (GLUCOTROL ) 5 MG 24 hr tablet Take 5 mg by mouth daily.     MAGNESIUM  PO Take 400 mg by mouth daily.     nitroGLYCERIN  (NITROSTAT ) 0.4 MG SL tablet Place 1 tablet (0.4 mg total) under the tongue every 5 (five) minutes as needed for chest pain. 90 tablet 3   pyridOXINE  (VITAMIN B6) 25 MG tablet Take 25 mg by mouth daily.      tamsulosin  (FLOMAX ) 0.4 MG CAPS capsule TAKE ONE CAPSULE (0.4MG  TOTAL) BY MOUTH DAILY 90 capsule 3   No current facility-administered medications for this visit.    Allergies:   Enalapril maleate, Losartan , Menthol, Shellfish allergy, Sulfasalazine, Sulfa antibiotics, and Sulfamethoxazole    Social History:  The patient  reports that he has never smoked. He has never used smokeless tobacco. He reports that he does not drink alcohol and does not use drugs.   Family History:  The patient's family history includes Arthritis in his mother; Diabetes in his father; Hypertension in his brother, father, and mother; Kidney disease in his brother; Stroke in his brother and father.    ROS:  Please see the history of present illness.   Otherwise, review of systems are positive for none.   All other systems are reviewed and negative.    PHYSICAL EXAM: VS:  There were no vitals taken for this visit. , BMI There is no height or weight on file to calculate BMI. GENERAL:  Well appearing elderly WM in NAD HEENT:  PERRL, EOMI, sclera are clear. Oropharynx is clear. NECK:  No jugular venous distention, carotid upstroke brisk and symmetric, no bruits, no thyromegaly or adenopathy LUNGS:  Clear to auscultation bilaterally CHEST:  Unremarkable HEART:  RRR,  PMI not displaced or sustained,S1 and S2 within normal limits, no S3, no S4: no clicks, no rubs, no murmurs ABD:  Soft, nontender. BS +, no masses or bruits. No hepatomegaly, no splenomegaly EXT:  2 + pulses throughout, no edema, no cyanosis no clubbing SKIN:  Warm and dry.  No rashes NEURO:  Alert and oriented x 3. Cranial nerves II through XII intact. PSYCH:  Cognitively intact   Recent Labs: 12/01/2023: ALT 27; BUN 18; Creatinine, Ser 0.95; Hemoglobin 14.0; Magnesium  1.9; Platelets 167; Potassium 3.7; Sodium 138    Lipid Panel    Component Value Date/Time   CHOL 139 11/30/2023 0231   TRIG 57 11/30/2023 0231   HDL 46 11/30/2023 0231   CHOLHDL  3.0 11/30/2023 0231   VLDL 11 11/30/2023 0231   LDLCALC 82 11/30/2023 0231      Wt Readings from Last 3 Encounters:  12/15/23 160 lb (72.6 kg)  11/29/23 165 lb (74.8 kg)  05/28/21 171 lb 3.2 oz (77.7 kg)    Labs reviewed from 12/28/15: cholesterol 149, triglycerides 74, LDL 88, HDL 46. Glucose 159, A1c 6.4%. Other chemistries, CBC, UA normal. Dated 02/03/17: cholesterol 134, triglycerides 77, HDL 41. A1c 6.7%. CBC and chemistries normal.  Dated 01/08/18: normal chemistries Dated 02/23/18 normal Hgb Dated 07/06/18 A1c 7.1%.  Dated 04/09/20: A1c 7.4%. cholesterol 146, triglycerides 79, HDL 46, LDL 78. Glucose 188. Potassium 3.4. otherwise CMET and CBC normal. Dated 12/22/20: a1C 7.1%.   Echo 12/10/17: Study Conclusions   -  Left ventricle: The cavity size was normal. There was moderate   focal basal hypertrophy of the septum. Systolic function was   vigorous. The estimated ejection fraction was in the range of 65%   to 70%. Wall motion was normal; there were no regional wall   motion abnormalities. Doppler parameters are consistent with   abnormal left ventricular relaxation (grade 1 diastolic   dysfunction). - Aortic valve: Trileaflet; mildly thickened, mildly calcified   leaflets. There was mild regurgitation.  Echo 11/30/23: IMPRESSIONS     1. Left ventricular ejection fraction, by estimation, is 55 to 60%. The  left ventricle has normal function. The left ventricle has no regional  wall motion abnormalities. There is moderate asymmetric left ventricular  hypertrophy of the basal-septal  segment. Left ventricular diastolic parameters were normal.   2. Right ventricular systolic function is normal. The right ventricular  size is normal.   3. Left atrial size was mildly dilated.   4. The mitral valve is grossly normal. Trivial mitral valve  regurgitation. No evidence of mitral stenosis.   5. The aortic valve is grossly normal. There is mild calcification of the  aortic valve. There  is mild thickening of the aortic valve. Aortic valve  regurgitation is trivial. Aortic valve sclerosis/calcification is present,  without any evidence of aortic  stenosis.   6. The inferior vena cava is normal in size with greater than 50%  respiratory variability, suggesting right atrial pressure of 3 mmHg.   Comparison(s): Changes from prior study are noted. EF no longer  hyperdynamic compared to prior but still within normal range.   Conclusion(s)/Recommendation(s): Normal biventricular function without  evidence of hemodynamically significant valvular heart disease.   Cardiac cath 12/01/23:  LEFT HEART CATH AND CORONARY ANGIOGRAPHY   Conclusion      Ost LAD to Prox LAD lesion is 99% stenosed.   Mid LAD lesion is 90% stenosed.   Prox LAD to Mid LAD lesion is 30% stenosed.   Mid RCA to Dist RCA lesion is 40% stenosed.   RPDA lesion is 50% stenosed.   Prox Cx to Mid Cx lesion is 30% stenosed.   Severe, heavily calcified stenosis in the ostial LAD. The lesion is essentially a flush sub-total occlusion with a micro channel. Severe calcification throughout the proximal and mid LAD.  The Circumflex and RCA have mild non-obstructive disease Normal LVEDP    Recommendations: He has complex, heavily calcified disease in the ostium of the LAD. This lesion is not favorable for PCI. He has no concerning symptoms suggestive of angina. His troponin was elevated but his presentation was with neuro symptoms. He has normal LV function. He is not a candidate for CABG given advanced age. I have reviewed his case with the IC team. Given advanced age and other findings as above, will take a conservative approach to management. Will add Plavix . He can be discharged today. If he has lifestyle limiting angina, can consider PCI of the LAD.   Coronary Diagrams  Diagnostic Dominance: Right  Intervention  ASSESSMENT AND PLAN:  1. CAD with recent NSTEMI with critical ostial LAD stenosis. Not favorable  for PCI. Not a surgical candidate. Fortunately not having angina. Will manage medically with ASA, plavix  for on year, amlodipien and Coreg . Given Rx for Ntg prn. Follow up in 4 months.  2. Hypertensive heart disease without heart failure. BP is generally well controlled.  Asked him to notify me if staying high so we can adjust therapy 2. Diastolic dysfunction 3.  PVCs-no symptoms 4. Aortic valve disease with mild aortic insufficiency 5. HLD lipitor dose increased to 40 mg daily  Follow up in 4 months with repeat lab   Signed, Aiman Sonn [place] MD, FACC   03/16/2024 11:05 AM    [facility]

## 2024-03-25 ENCOUNTER — Ambulatory Visit: Attending: Cardiology | Admitting: Cardiology

## 2024-03-26 ENCOUNTER — Encounter: Payer: Self-pay | Admitting: Cardiology

## 2024-03-29 DIAGNOSIS — Z23 Encounter for immunization: Secondary | ICD-10-CM | POA: Diagnosis not present

## 2024-04-09 ENCOUNTER — Telehealth: Payer: Self-pay | Admitting: Urology

## 2024-04-09 NOTE — Telephone Encounter (Signed)
 Patient has back pain and thinks he has kidney stones.

## 2024-04-09 NOTE — Telephone Encounter (Signed)
 Return call to pt and he state's that he is normal have back pain in the middle but now he is having flank pain on both. Pt state's he and his wife talk being that she a nurse and ask him a series of question and she felt pt may just be having back pain but nothing that has to do with a kidney stone. Voiced understanding

## 2024-04-15 ENCOUNTER — Other Ambulatory Visit: Payer: Self-pay | Admitting: Cardiology

## 2024-04-24 DIAGNOSIS — M25551 Pain in right hip: Secondary | ICD-10-CM | POA: Diagnosis not present

## 2024-04-24 DIAGNOSIS — M533 Sacrococcygeal disorders, not elsewhere classified: Secondary | ICD-10-CM | POA: Diagnosis not present

## 2024-04-24 DIAGNOSIS — M791 Myalgia, unspecified site: Secondary | ICD-10-CM | POA: Diagnosis not present

## 2024-05-03 DIAGNOSIS — M791 Myalgia, unspecified site: Secondary | ICD-10-CM | POA: Diagnosis not present

## 2024-05-03 DIAGNOSIS — M5451 Vertebrogenic low back pain: Secondary | ICD-10-CM | POA: Diagnosis not present

## 2024-05-09 DIAGNOSIS — E1129 Type 2 diabetes mellitus with other diabetic kidney complication: Secondary | ICD-10-CM | POA: Diagnosis not present

## 2024-05-09 DIAGNOSIS — Z79899 Other long term (current) drug therapy: Secondary | ICD-10-CM | POA: Diagnosis not present

## 2024-05-09 DIAGNOSIS — I10 Essential (primary) hypertension: Secondary | ICD-10-CM | POA: Diagnosis not present

## 2024-05-15 DIAGNOSIS — E1129 Type 2 diabetes mellitus with other diabetic kidney complication: Secondary | ICD-10-CM | POA: Diagnosis not present

## 2024-05-15 DIAGNOSIS — R7401 Elevation of levels of liver transaminase levels: Secondary | ICD-10-CM | POA: Diagnosis not present

## 2024-05-15 DIAGNOSIS — I10 Essential (primary) hypertension: Secondary | ICD-10-CM | POA: Diagnosis not present

## 2024-05-15 NOTE — Progress Notes (Signed)
 Cardiology Office Note   Date:  05/21/2024   ID:  Shawn Stephenson, DOB 03-16-1930, MRN [id number]  PCP:  Sheryle Carwin, MD  Cardiologist: Castulo Scarpelli MD  Chief Complaint  Patient presents with   Hypertension       History of Present Illness: Shawn Stephenson is a 89 y.o. adult who presents for follow up PVCs and hypertensive heart disease. Last seen by me in Dec 2022.   He has a past history of frequent PVCs and a past history of hypertensive cardiovascular disease and a history of diastolic dysfunction of the left ventricle. He also has hypercholesterolemia. He is a mild diabetic. The patient had a Myoview stress test on 10/01/13 which showed no ischemia and showed a very small apical scar.  The study was not gated because of PVCs and PACs. His last echocardiogram was 02/10/14 and showed normal left ventricular systolic function with ejection fraction of 65%.  There was aortic valve sclerosis without stenosis. Most recent Echo in June 2019 showed focal basal septal hypertrophy otherwise normal LV.   His  Daughter Shawn Stephenson is a patient of mine and had septal myectomy therapy for hypertrophic obstructive cardiomyopathy done at [facility] clinic.   He was recently admitted to the hospital with confusion, HA, and slurred speech. He ruled in for NSTEMI with peak troponin 1,143. Ecg showed a RBBB, LAFB. No acute ST changes. Underwent cardiac cath showing critical subtotal occlusion of the ostial LAD. There was 90% stenosis in the mid LAD. The vessel was severely calcified. The angle was not favorable for PCI. He was not a surgical candidate given age. Echo showed normal LV function. He had no angina. Medical management recommended. Lipitor dose was increased and plavix  added.   On follow up today he feels well. Denies any chest pain or SOB. Activity is fairly limited. Has significant arthritic symptoms. Is seeing Dr Heide. Plans MRI this week. Did not tolerate tramadol or gabapentin.  Notes he was put on Actos for his DM but had significant swelling after one dose and stopped it.    Past Medical History:  Diagnosis Date   Asymptomatic PVCs    Diabetes mellitus    well controlled   Diastolic dysfunction    Hyperlipidemia    Hypertension    Mitral regurgitation    mild    Past Surgical History:  Procedure Laterality Date   INTRAMEDULLARY (IM) NAIL INTERTROCHANTERIC Right 12/09/2017   Procedure: INTRAMEDULLARY (IM) NAIL INTERTROCHANTRIC;  Surgeon: Fidel Rogue, MD;  Location: [facility] OR;  Service: Orthopedics;  Laterality: Right;   LEFT HEART CATH AND CORONARY ANGIOGRAPHY N/A 12/01/2023   Procedure: LEFT HEART CATH AND CORONARY ANGIOGRAPHY;  Surgeon: Verlin Lonni BIRCH, MD;  Location: [facility] INVASIVE CV LAB;  Service: Cardiovascular;  Laterality: N/A;     Current Outpatient Medications  Medication Sig Dispense Refill   ACCU-CHEK AVIVA PLUS test strip      ACCU-CHEK SOFTCLIX LANCETS lancets      amLODipine  (NORVASC ) 10 MG tablet Take 1 tablet (10 mg total) by mouth daily. 90 tablet 3   aspirin  81 MG chewable tablet Chew 1 tablet (81 mg total) by mouth daily. 60 tablet 1   atorvastatin  (LIPITOR) 40 MG tablet Take 1 tablet (40 mg total) by mouth daily. 90 tablet 3   Blood Glucose Monitoring Suppl (ACCU-CHEK AVIVA PLUS) W/DEVICE KIT      carvedilol  (COREG ) 6.25 MG tablet TAKE ONE TABLET BY MOUTH TWICE A DAY WITH A MEAL 60 tablet  7   Cholecalciferol  (VITAMIN D-3) 1000 UNITS CAPS Take 1,000 Units by mouth daily.     clopidogrel  (PLAVIX ) 75 MG tablet Take 1 tablet (75 mg total) by mouth daily. 90 tablet 3   Cyanocobalamin  (VITAMIN B 12 PO) Take 1,000 mcg by mouth daily.      fexofenadine (ALLEGRA) 180 MG tablet Take 180 mg by mouth daily.     glipiZIDE  (GLUCOTROL ) 5 MG 24 hr tablet Take 5 mg by mouth daily.     MAGNESIUM  PO Take 400 mg by mouth daily.     nitroGLYCERIN  (NITROSTAT ) 0.4 MG SL tablet Place 1 tablet (0.4 mg total) under the tongue every 5 (five) minutes as  needed for chest pain. 90 tablet 3   pyridOXINE  (VITAMIN B6) 25 MG tablet Take 25 mg by mouth daily.     tamsulosin  (FLOMAX ) 0.4 MG CAPS capsule TAKE ONE CAPSULE (0.4MG  TOTAL) BY MOUTH DAILY 90 capsule 3   No current facility-administered medications for this visit.    Allergies:   Enalapril maleate, Losartan , Menthol, Shellfish allergy, Sulfasalazine, Sulfa antibiotics, and Sulfamethoxazole    Social History:  The patient  reports that he has never smoked. He has never used smokeless tobacco. He reports that he does not drink alcohol and does not use drugs.   Family History:  The patient's family history includes Arthritis in his mother; Diabetes in his father; Hypertension in his brother, father, and mother; Kidney disease in his brother; Stroke in his brother and father.    ROS:  Please see the history of present illness.   Otherwise, review of systems are positive for none.   All other systems are reviewed and negative.    PHYSICAL EXAM: VS:  BP 132/66 (BP Location: Left Arm, Cuff Size: Normal)   Pulse 80   Ht 5' 10.5 (1.791 m)   Wt 150 lb (68 kg)   SpO2 95%   BMI 21.22 kg/m  , BMI Body mass index is 21.22 kg/m. GENERAL:  Well appearing elderly WM in NAD HEENT:  PERRL, EOMI, sclera are clear. Oropharynx is clear. NECK:  No jugular venous distention, carotid upstroke brisk and symmetric, no bruits, no thyromegaly or adenopathy LUNGS:  Clear to auscultation bilaterally CHEST:  Unremarkable HEART:  RRR,  PMI not displaced or sustained,S1 and S2 within normal limits, no S3, no S4: no clicks, no rubs, no murmurs ABD:  Soft, nontender. BS +, no masses or bruits. No hepatomegaly, no splenomegaly EXT:  2 + pulses throughout, no edema, no cyanosis no clubbing SKIN:  Warm and dry.  No rashes NEURO:  Alert and oriented x 3. Cranial nerves II through XII intact. PSYCH:  Cognitively intact   Recent Labs: 12/01/2023: ALT 27; BUN 18; Creatinine, Ser 0.95; Hemoglobin 14.0; Magnesium  1.9;  Platelets 167; Potassium 3.7; Sodium 138    Lipid Panel    Component Value Date/Time   CHOL 139 11/30/2023 0231   TRIG 57 11/30/2023 0231   HDL 46 11/30/2023 0231   CHOLHDL 3.0 11/30/2023 0231   VLDL 11 11/30/2023 0231   LDLCALC 82 11/30/2023 0231    Dated 02/01/24: cholesterol 120, triglycerides 71, HDL 45, LDL 82. A1c 8.4. CMET and CBc normal.   Wt Readings from Last 3 Encounters:  05/21/24 150 lb (68 kg)  12/15/23 160 lb (72.6 kg)  11/29/23 165 lb (74.8 kg)    Labs reviewed from 12/28/15: cholesterol 149, triglycerides 74, LDL 88, HDL 46. Glucose 159, A1c 6.4%. Other chemistries, CBC, UA normal. Dated 02/03/17:  cholesterol 134, triglycerides 77, HDL 41. A1c 6.7%. CBC and chemistries normal.  Dated 01/08/18: normal chemistries Dated 02/23/18 normal Hgb Dated 07/06/18 A1c 7.1%.  Dated 04/09/20: A1c 7.4%. cholesterol 146, triglycerides 79, HDL 46, LDL 78. Glucose 188. Potassium 3.4. otherwise CMET and CBC normal. Dated 12/22/20: a1C 7.1%.   Echo 12/10/17: Study Conclusions   - Left ventricle: The cavity size was normal. There was moderate   focal basal hypertrophy of the septum. Systolic function was   vigorous. The estimated ejection fraction was in the range of 65%   to 70%. Wall motion was normal; there were no regional wall   motion abnormalities. Doppler parameters are consistent with   abnormal left ventricular relaxation (grade 1 diastolic   dysfunction). - Aortic valve: Trileaflet; mildly thickened, mildly calcified   leaflets. There was mild regurgitation.  Echo 11/30/23: IMPRESSIONS     1. Left ventricular ejection fraction, by estimation, is 55 to 60%. The  left ventricle has normal function. The left ventricle has no regional  wall motion abnormalities. There is moderate asymmetric left ventricular  hypertrophy of the basal-septal  segment. Left ventricular diastolic parameters were normal.   2. Right ventricular systolic function is normal. The right ventricular   size is normal.   3. Left atrial size was mildly dilated.   4. The mitral valve is grossly normal. Trivial mitral valve  regurgitation. No evidence of mitral stenosis.   5. The aortic valve is grossly normal. There is mild calcification of the  aortic valve. There is mild thickening of the aortic valve. Aortic valve  regurgitation is trivial. Aortic valve sclerosis/calcification is present,  without any evidence of aortic  stenosis.   6. The inferior vena cava is normal in size with greater than 50%  respiratory variability, suggesting right atrial pressure of 3 mmHg.   Comparison(s): Changes from prior study are noted. EF no longer  hyperdynamic compared to prior but still within normal range.   Conclusion(s)/Recommendation(s): Normal biventricular function without  evidence of hemodynamically significant valvular heart disease.   Cardiac cath 12/01/23:  LEFT HEART CATH AND CORONARY ANGIOGRAPHY   Conclusion      Ost LAD to Prox LAD lesion is 99% stenosed.   Mid LAD lesion is 90% stenosed.   Prox LAD to Mid LAD lesion is 30% stenosed.   Mid RCA to Dist RCA lesion is 40% stenosed.   RPDA lesion is 50% stenosed.   Prox Cx to Mid Cx lesion is 30% stenosed.   Severe, heavily calcified stenosis in the ostial LAD. The lesion is essentially a flush sub-total occlusion with a micro channel. Severe calcification throughout the proximal and mid LAD.  The Circumflex and RCA have mild non-obstructive disease Normal LVEDP    Recommendations: He has complex, heavily calcified disease in the ostium of the LAD. This lesion is not favorable for PCI. He has no concerning symptoms suggestive of angina. His troponin was elevated but his presentation was with neuro symptoms. He has normal LV function. He is not a candidate for CABG given advanced age. I have reviewed his case with the IC team. Given advanced age and other findings as above, will take a conservative approach to management. Will add  Plavix . He can be discharged today. If he has lifestyle limiting angina, can consider PCI of the LAD.   Coronary Diagrams  Diagnostic Dominance: Right  Intervention  ASSESSMENT AND PLAN:  1. CAD with recent NSTEMI in June 2025 with critical ostial LAD stenosis. Not favorable  for PCI. Not a surgical candidate. Fortunately not having angina. Will manage medically with ASA, plavix  for on year, amlodipine  and Coreg . Given Rx for Ntg prn. Follow up in 6 months 2. Hypertensive heart disease without heart failure. BP is generally well controlled.  132/66 today 2. Diastolic dysfunction 3. PVCs-no symptoms 4. Aortic valve disease with mild aortic insufficiency 5. HLD lipitor dose increased to 40 mg daily. Last LDL 80 6. DM. Per PCP, intolerant of Actos.   Follow up in 6 months.   Signed, Dearl Rudden MD, [facility]   05/21/2024 4:30 PM    [facility]

## 2024-05-21 ENCOUNTER — Ambulatory Visit: Attending: Cardiology | Admitting: Cardiology

## 2024-05-21 ENCOUNTER — Encounter: Payer: Self-pay | Admitting: Cardiology

## 2024-05-21 VITALS — BP 132/66 | HR 80 | Ht 70.5 in | Wt 150.0 lb

## 2024-05-21 DIAGNOSIS — E7800 Pure hypercholesterolemia, unspecified: Secondary | ICD-10-CM

## 2024-05-21 DIAGNOSIS — I25118 Atherosclerotic heart disease of native coronary artery with other forms of angina pectoris: Secondary | ICD-10-CM

## 2024-05-21 DIAGNOSIS — I10 Essential (primary) hypertension: Secondary | ICD-10-CM

## 2024-05-21 NOTE — Patient Instructions (Signed)
 Medication Instructions:  Continue same medications *If you need a refill on your cardiac medications before your next appointment, please call your pharmacy*  Lab Work: None ordered  Testing/Procedures: None ordered  Follow-Up: At [facility], you and your health needs are our priority.  As part of our continuing mission to provide you with exceptional heart care, our providers are all part of one team.  This team includes your primary Cardiologist (physician) and Advanced Practice Providers or APPs (Physician Assistants and Nurse Practitioners) who all work together to provide you with the care you need, when you need it.  Your next appointment:  6 months   Call in April to schedule June appointment     Provider:  Dr.Jordan    We recommend signing up for the patient portal called MyChart.  Sign up information is provided on this After Visit Summary.  MyChart is used to connect with patients for Virtual Visits (Telemedicine).  Patients are able to view lab/test results, encounter notes, upcoming appointments, etc.  Non-urgent messages can be sent to your provider as well.   To learn more about what you can do with MyChart, go to [email or web address].

## 2024-05-23 DIAGNOSIS — M545 Low back pain, unspecified: Secondary | ICD-10-CM | POA: Diagnosis not present

## 2024-05-23 DIAGNOSIS — M25551 Pain in right hip: Secondary | ICD-10-CM | POA: Diagnosis not present

## 2024-05-30 DIAGNOSIS — M545 Low back pain, unspecified: Secondary | ICD-10-CM | POA: Diagnosis not present

## 2024-06-10 ENCOUNTER — Other Ambulatory Visit: Payer: Self-pay | Admitting: Urology

## 2024-06-10 DIAGNOSIS — N400 Enlarged prostate without lower urinary tract symptoms: Secondary | ICD-10-CM

## 2024-06-21 ENCOUNTER — Telehealth (HOSPITAL_BASED_OUTPATIENT_CLINIC_OR_DEPARTMENT_OTHER): Payer: Self-pay | Admitting: *Deleted

## 2024-06-21 NOTE — Telephone Encounter (Signed)
"  ° °  Pre-operative Risk Assessment    Patient Name: Shawn Stephenson  DOB: [birth year] MRN: [id number]   Date of last office visit: 05/21/24 DR. JORDAN Date of next office visit: NONE   Request for Surgical Clearance    Procedure:  LUMBAR ESI (INTERLAMINAR) L4-L5  Date of Surgery:  Clearance TBD                                Surgeon:  DR. LAQUETA Surgeon's Group or Practice Name:  [facility] Phone number:  [phone] EXT [phone] [facility] Fax number:  [phone]   Type of Clearance Requested:   - Medical  - Pharmacy:  Hold Clopidogrel  (Plavix ) x 7 DAYS PRIOR   Type of Anesthesia:  Not Indicated   Additional requests/questions:    [name]   06/21/2024, 12:35 PM   "

## 2024-06-21 NOTE — Telephone Encounter (Signed)
 Hey Dr. Jordan, thank you for the Plavix  hold recommendations. Given this patient's extensive CAD, not a candidate for PCI/surgical intervention as seen on [facility] in June, could you please comment on surgical clearance/risk for upcoming Lumbar ESI L4-L5 scheduled for TBD? Please route your response to P CV DIV PREOP. Thank you!

## 2024-06-24 NOTE — Telephone Encounter (Signed)
" ° °  Name:  Shawn Stephenson  DOB:  1929/07/03  MRN:  [id number]   Primary Cardiologist: None  Chart reviewed as part of pre-operative protocol coverage. Patient was contacted 06/24/2024 in reference to pre-operative risk assessment for pending surgery as outlined below.  Shawn Stephenson was last seen on 05/21/2024 by Dr. Jordan.  Given this patient's extensive CAD, not a candidate for PCI/surgical intervention as seen on LHC in June, risk for upcoming Lumbar ESI L4-L5   Per Dr.Jordan Sorry. I think he would be high risk for anesthesia based on age, CAD and limited functional capacity   [name] MD, [facility]  Pre-op covering staff: - Please schedule appointment and call patient to inform them. If patient already had an upcoming appointment within acceptable timeframe, please add pre-op clearance to the appointment notes so provider is aware. - Please contact requesting surgeon's office via preferred method (i.e, phone, fax) to inform them of need for appointment prior to surgery.  Lamarr Satterfield, NP 06/24/2024, 8:39 AM    "

## 2024-06-25 NOTE — Telephone Encounter (Signed)
 [facility] requesting a c/b to get clarification about surgical clearance. Please call after 2:45

## 2024-06-25 NOTE — Telephone Encounter (Signed)
 [facility] called for clarfication on notes about clearance. I have read the notes per Dr. Jordan: Per Dr.Jordan Sorry. I think he would be high risk for anesthesia based on age, CAD and limited functional capacity   [name] MD, [facility]   Notes per preop APP Lamarr CROME, DNP: Chart reviewed as part of pre-operative protocol coverage. Patient was contacted 06/24/2024 in reference to pre-operative risk assessment for pending surgery as outlined below.  Shawn Stephenson was last seen on 05/21/2024 by Dr. Jordan.  Given this patient's extensive CAD, not a candidate for PCI/surgical intervention as seen on LHC in June, risk for upcoming Lumbar ESI L4-L5   In reading these notes would lead me to believe the pt is not cleared. I will however confirm this with the preop APP today Katlyn W. NP

## 2024-06-26 NOTE — Telephone Encounter (Signed)
 I will update the requesting office to see notes from preop APP and Dr. Jordan as well.

## 2024-07-10 ENCOUNTER — Inpatient Hospital Stay (HOSPITAL_COMMUNITY)

## 2024-07-10 ENCOUNTER — Emergency Department (HOSPITAL_COMMUNITY)

## 2024-07-10 ENCOUNTER — Other Ambulatory Visit: Payer: Self-pay

## 2024-07-10 ENCOUNTER — Inpatient Hospital Stay (HOSPITAL_COMMUNITY)
Admission: EM | Admit: 2024-07-10 | Discharge: 2024-07-11 | DRG: 062 | Disposition: A | Discharge status: Home / Self Care | Attending: Neurology | Admitting: Neurology

## 2024-07-10 ENCOUNTER — Encounter (HOSPITAL_COMMUNITY): Payer: Self-pay

## 2024-07-10 DIAGNOSIS — E1165 Type 2 diabetes mellitus with hyperglycemia: Secondary | ICD-10-CM | POA: Diagnosis present

## 2024-07-10 DIAGNOSIS — Z8249 Family history of ischemic heart disease and other diseases of the circulatory system: Secondary | ICD-10-CM | POA: Diagnosis not present

## 2024-07-10 DIAGNOSIS — I110 Hypertensive heart disease with heart failure: Secondary | ICD-10-CM | POA: Diagnosis present

## 2024-07-10 DIAGNOSIS — R4701 Aphasia: Secondary | ICD-10-CM | POA: Diagnosis present

## 2024-07-10 DIAGNOSIS — E785 Hyperlipidemia, unspecified: Secondary | ICD-10-CM | POA: Diagnosis present

## 2024-07-10 DIAGNOSIS — Z8261 Family history of arthritis: Secondary | ICD-10-CM

## 2024-07-10 DIAGNOSIS — I5032 Chronic diastolic (congestive) heart failure: Secondary | ICD-10-CM | POA: Diagnosis present

## 2024-07-10 DIAGNOSIS — R4789 Other speech disturbances: Secondary | ICD-10-CM

## 2024-07-10 DIAGNOSIS — I2510 Atherosclerotic heart disease of native coronary artery without angina pectoris: Secondary | ICD-10-CM | POA: Diagnosis present

## 2024-07-10 DIAGNOSIS — Z8419 Family history of other disorders of kidney and ureter: Secondary | ICD-10-CM | POA: Diagnosis not present

## 2024-07-10 DIAGNOSIS — I639 Cerebral infarction, unspecified: Principal | ICD-10-CM | POA: Diagnosis present

## 2024-07-10 DIAGNOSIS — Z79899 Other long term (current) drug therapy: Secondary | ICD-10-CM

## 2024-07-10 DIAGNOSIS — I6623 Occlusion and stenosis of bilateral posterior cerebral arteries: Secondary | ICD-10-CM | POA: Diagnosis present

## 2024-07-10 DIAGNOSIS — Z888 Allergy status to other drugs, medicaments and biological substances status: Secondary | ICD-10-CM

## 2024-07-10 DIAGNOSIS — Z7982 Long term (current) use of aspirin: Secondary | ICD-10-CM

## 2024-07-10 DIAGNOSIS — G459 Transient cerebral ischemic attack, unspecified: Secondary | ICD-10-CM

## 2024-07-10 DIAGNOSIS — Z882 Allergy status to sulfonamides status: Secondary | ICD-10-CM

## 2024-07-10 DIAGNOSIS — R29707 NIHSS score 7: Secondary | ICD-10-CM | POA: Diagnosis present

## 2024-07-10 DIAGNOSIS — R29810 Facial weakness: Secondary | ICD-10-CM | POA: Diagnosis present

## 2024-07-10 DIAGNOSIS — I340 Nonrheumatic mitral (valve) insufficiency: Secondary | ICD-10-CM | POA: Diagnosis present

## 2024-07-10 DIAGNOSIS — Z7902 Long term (current) use of antithrombotics/antiplatelets: Secondary | ICD-10-CM

## 2024-07-10 DIAGNOSIS — Z823 Family history of stroke: Secondary | ICD-10-CM | POA: Diagnosis not present

## 2024-07-10 DIAGNOSIS — R410 Disorientation, unspecified: Secondary | ICD-10-CM | POA: Diagnosis not present

## 2024-07-10 DIAGNOSIS — Z833 Family history of diabetes mellitus: Secondary | ICD-10-CM | POA: Diagnosis not present

## 2024-07-10 DIAGNOSIS — Z7401 Bed confinement status: Secondary | ICD-10-CM | POA: Diagnosis not present

## 2024-07-10 DIAGNOSIS — I10 Essential (primary) hypertension: Secondary | ICD-10-CM | POA: Diagnosis not present

## 2024-07-10 DIAGNOSIS — Z7984 Long term (current) use of oral hypoglycemic drugs: Secondary | ICD-10-CM | POA: Diagnosis not present

## 2024-07-10 DIAGNOSIS — R569 Unspecified convulsions: Secondary | ICD-10-CM | POA: Diagnosis not present

## 2024-07-10 LAB — I-STAT CHEM 8, ED
BUN: 17 mg/dL (ref 8–23)
Calcium, Ion: 1.14 mmol/L — ABNORMAL LOW (ref 1.15–1.40)
Chloride: 97 mmol/L — ABNORMAL LOW (ref 98–111)
Creatinine, Ser: 0.9 mg/dL (ref 0.61–1.24)
Glucose, Bld: 190 mg/dL — ABNORMAL HIGH (ref 70–99)
HCT: 37 % — ABNORMAL LOW (ref 39.0–52.0)
Hemoglobin: 12.6 g/dL — ABNORMAL LOW (ref 13.0–17.0)
Potassium: 3.8 mmol/L (ref 3.5–5.1)
Sodium: 137 mmol/L (ref 135–145)
TCO2: 28 mmol/L (ref 22–32)

## 2024-07-10 LAB — COMPREHENSIVE METABOLIC PANEL WITH GFR
ALT: 35 U/L (ref 0–44)
AST: 30 U/L (ref 15–41)
Albumin: 3.9 g/dL (ref 3.5–5.0)
Alkaline Phosphatase: 121 U/L (ref 38–126)
Anion gap: 14 (ref 5–15)
BUN: 16 mg/dL (ref 8–23)
CO2: 24 mmol/L (ref 22–32)
Calcium: 9 mg/dL (ref 8.9–10.3)
Chloride: 99 mmol/L (ref 98–111)
Creatinine, Ser: 0.83 mg/dL (ref 0.61–1.24)
GFR, Estimated: >60 mL/min
Glucose, Bld: 191 mg/dL — ABNORMAL HIGH (ref 70–99)
Potassium: 3.9 mmol/L (ref 3.5–5.1)
Sodium: 137 mmol/L (ref 135–145)
Total Bilirubin: 0.7 mg/dL (ref 0.0–1.2)
Total Protein: 6.6 g/dL (ref 6.5–8.1)

## 2024-07-10 LAB — ECHOCARDIOGRAM COMPLETE
Area-P 1/2: 3.03 cm2
Height: 67 in
Weight: 2352 [oz_av]

## 2024-07-10 LAB — DIFFERENTIAL
Abs Immature Granulocytes: 0 K/uL (ref 0.00–0.07)
Basophils Absolute: 0 K/uL (ref 0.0–0.1)
Basophils Relative: 1 %
Eosinophils Absolute: 0 K/uL (ref 0.0–0.5)
Eosinophils Relative: 0 %
Immature Granulocytes: 0 %
Lymphocytes Relative: 17 %
Lymphs Abs: 1 K/uL (ref 0.7–4.0)
Monocytes Absolute: 0.5 K/uL (ref 0.1–1.0)
Monocytes Relative: 8 %
Neutro Abs: 4.2 K/uL (ref 1.7–7.7)
Neutrophils Relative %: 74 %

## 2024-07-10 LAB — URINALYSIS, ROUTINE W REFLEX MICROSCOPIC
Bilirubin Urine: NEGATIVE
Glucose, UA: NEGATIVE mg/dL
Ketones, ur: 20 mg/dL — AB
Leukocytes,Ua: NEGATIVE
Nitrite: NEGATIVE
Protein, ur: NEGATIVE mg/dL
Specific Gravity, Urine: 1.019 (ref 1.005–1.030)
pH: 8 (ref 5.0–8.0)

## 2024-07-10 LAB — CBC
HCT: 37.5 % — ABNORMAL LOW (ref 39.0–52.0)
Hemoglobin: 12.5 g/dL — ABNORMAL LOW (ref 13.0–17.0)
MCH: 32.2 pg (ref 26.0–34.0)
MCHC: 33.3 g/dL (ref 30.0–36.0)
MCV: 96.6 fL (ref 80.0–100.0)
Platelets: 250 K/uL (ref 150–400)
RBC: 3.88 MIL/uL — ABNORMAL LOW (ref 4.22–5.81)
RDW: 13.3 % (ref 11.5–15.5)
WBC: 5.7 K/uL (ref 4.0–10.5)
nRBC: 0 % (ref 0.0–0.2)

## 2024-07-10 LAB — CBG MONITORING, ED
Glucose-Capillary: 204 mg/dL — ABNORMAL HIGH (ref 70–99)
Glucose-Capillary: 204 mg/dL — ABNORMAL HIGH (ref 70–99)

## 2024-07-10 LAB — URINE DRUG SCREEN
Amphetamines: NEGATIVE
Barbiturates: NEGATIVE
Benzodiazepines: NEGATIVE
Cocaine: NEGATIVE
Fentanyl: NEGATIVE
Methadone Scn, Ur: NEGATIVE
Opiates: NEGATIVE
Tetrahydrocannabinol: NEGATIVE

## 2024-07-10 LAB — GLUCOSE, CAPILLARY
Glucose-Capillary: 141 mg/dL — ABNORMAL HIGH (ref 70–99)
Glucose-Capillary: 150 mg/dL — ABNORMAL HIGH (ref 70–99)
Glucose-Capillary: 186 mg/dL — ABNORMAL HIGH (ref 70–99)

## 2024-07-10 LAB — MRSA NEXT GEN BY PCR, NASAL: MRSA by PCR Next Gen: NOT DETECTED

## 2024-07-10 LAB — PROTIME-INR
INR: 1.1 (ref 0.8–1.2)
Prothrombin Time: 14.5 s (ref 11.4–15.2)

## 2024-07-10 LAB — ETHANOL: Alcohol, Ethyl (B): <15 mg/dL

## 2024-07-10 LAB — APTT: aPTT: 31 s (ref 24–36)

## 2024-07-10 MED ORDER — SODIUM CHLORIDE 0.9 % IV SOLN: INTRAVENOUS | Status: DC

## 2024-07-10 MED ORDER — INSULIN ASPART 100 UNIT/ML IJ SOLN: 0.0000 [IU] | INTRAMUSCULAR | Status: DC

## 2024-07-10 MED ORDER — PANTOPRAZOLE SODIUM 40 MG IV SOLR
40.0000 mg | Freq: Every day | INTRAVENOUS | Status: DC
  Administered 2024-07-10: 40 mg via INTRAVENOUS
  Filled 2024-07-10: qty 10

## 2024-07-10 MED ORDER — HYDROMORPHONE HCL 1 MG/ML IJ SOLN
0.5000 mg | INTRAMUSCULAR | Status: DC | PRN
  Administered 2024-07-10: 0.5 mg via INTRAVENOUS
  Filled 2024-07-10: qty 1

## 2024-07-10 MED ORDER — CHLORHEXIDINE GLUCONATE CLOTH 2 % EX PADS
6.0000 | MEDICATED_PAD | Freq: Every day | CUTANEOUS | Status: DC
  Administered 2024-07-10 – 2024-07-11 (×2): 6 via TOPICAL

## 2024-07-10 MED ORDER — STROKE: EARLY STAGES OF RECOVERY BOOK
Freq: Once | Status: AC
  Filled 2024-07-10: qty 1

## 2024-07-10 MED ORDER — ACETAMINOPHEN 325 MG PO TABS: 650.0000 mg | ORAL_TABLET | ORAL | Status: DC | PRN

## 2024-07-10 MED ORDER — ACETAMINOPHEN 160 MG/5ML PO SOLN: 650.0000 mg | ORAL | Status: DC | PRN

## 2024-07-10 MED ORDER — HYDROMORPHONE HCL 1 MG/ML IJ SOLN: 0.5000 mg | INTRAMUSCULAR | Status: DC | PRN

## 2024-07-10 MED ORDER — TENECTEPLASE 25 MG IV KIT
0.2500 mg/kg | PACK | Freq: Once | INTRAVENOUS | Status: AC
  Administered 2024-07-10: 17 mg via INTRAVENOUS
  Filled 2024-07-10: qty 5

## 2024-07-10 MED ORDER — ACETAMINOPHEN 650 MG RE SUPP: 650.0000 mg | RECTAL | Status: DC | PRN

## 2024-07-10 MED ORDER — INSULIN ASPART 100 UNIT/ML IJ SOLN
0.0000 [IU] | INTRAMUSCULAR | Status: DC
  Administered 2024-07-10: 3 [IU] via SUBCUTANEOUS
  Administered 2024-07-10 – 2024-07-11 (×2): 2 [IU] via SUBCUTANEOUS
  Filled 2024-07-10 (×2): qty 3
  Filled 2024-07-10: qty 2

## 2024-07-10 MED ORDER — IOHEXOL 350 MG/ML SOLN
75.0000 mL | Freq: Once | INTRAVENOUS | Status: AC | PRN
  Administered 2024-07-10: 75 mL via INTRAVENOUS

## 2024-07-10 MED ORDER — LABETALOL HCL 5 MG/ML IV SOLN
10.0000 mg | INTRAVENOUS | Status: DC | PRN
  Administered 2024-07-10 – 2024-07-11 (×2): 10 mg via INTRAVENOUS
  Filled 2024-07-10: qty 4

## 2024-07-10 MED ORDER — SENNOSIDES-DOCUSATE SODIUM 8.6-50 MG PO TABS: 1.0000 | ORAL_TABLET | Freq: Every evening | ORAL | Status: DC | PRN

## 2024-07-10 MED ORDER — TRAMADOL HCL 50 MG PO TABS
50.0000 mg | ORAL_TABLET | Freq: Four times a day (QID) | ORAL | Status: DC | PRN
  Administered 2024-07-10: 50 mg via ORAL
  Filled 2024-07-10: qty 1

## 2024-07-10 NOTE — ED Provider Notes (Signed)
 " [place] EMERGENCY DEPARTMENT AT [facility] Provider Note   CSN: [id number] Arrival date & time: 07/10/24  9089  An emergency department physician performed an initial assessment on this suspected stroke patient at 0913.  Patient presents with: Code Stroke   Shawn Stephenson is a 89 y.o. adult.   89 year old male history of CAD on aspirin  and Plavix , diabetes, hypertension, hyperlipidemia, and heart failure with preserved ejection fraction who presents to the emergency department difficulty speaking.  Woke up at 7 AM and was normal.  At 8 AM was finishing showering and was having difficulty speaking.  Upon EMS arrival had right-sided facial droop and aphasia.  Code stroke activated in the field.  Not on any blood thinners.  No upper extremity weakness.       Prior to Admission medications  Medication Sig Start Date End Date Taking? Authorizing Provider  ACCU-CHEK AVIVA PLUS test strip  04/29/14   [provider]  ACCU-CHEK SOFTCLIX LANCETS lancets  04/29/14   [provider]  amLODipine  (NORVASC ) 10 MG tablet Take 1 tablet (10 mg total) by mouth daily. 05/28/21   Jordan, Peter M, MD  aspirin  81 MG chewable tablet Chew 1 tablet (81 mg total) by mouth daily. 12/12/17   Swinteck, Redell, MD  atorvastatin  (LIPITOR) 40 MG tablet Take 1 tablet (40 mg total) by mouth daily. 12/15/23   Jordan, Peter M, MD  Blood Glucose Monitoring Suppl (ACCU-CHEK AVIVA PLUS) W/DEVICE KIT  04/29/14   [provider]  carvedilol  (COREG ) 6.25 MG tablet TAKE ONE TABLET BY MOUTH TWICE A DAY WITH A MEAL 04/17/24   Jordan, Peter M, MD  Cholecalciferol  (VITAMIN D-3) 1000 UNITS CAPS Take 1,000 Units by mouth daily.    [provider]  clopidogrel  (PLAVIX ) 75 MG tablet Take 1 tablet (75 mg total) by mouth daily. 12/02/23   Arlon Carliss ORN, DO  Cyanocobalamin  (VITAMIN B 12 PO) Take 1,000 mcg by mouth daily.     [provider]  fexofenadine (ALLEGRA) 180 MG tablet  Take 180 mg by mouth daily.    [provider]  glipiZIDE  (GLUCOTROL ) 5 MG 24 hr tablet Take 5 mg by mouth daily.    [provider]  MAGNESIUM  PO Take 400 mg by mouth daily.    [provider]  nitroGLYCERIN  (NITROSTAT ) 0.4 MG SL tablet Place 1 tablet (0.4 mg total) under the tongue every 5 (five) minutes as needed for chest pain. 12/15/23 05/21/24  Jordan, Peter M, MD  pyridOXINE  (VITAMIN B6) 25 MG tablet Take 25 mg by mouth daily.    [provider]  tamsulosin  (FLOMAX ) 0.4 MG CAPS capsule TAKE ONE CAPSULE (0.4MG  TOTAL) BY MOUTH DAILY 06/11/24   Matilda Senior, MD    Allergies: Enalapril maleate, Losartan , Menthol, Shellfish allergy, Sulfasalazine, Sulfa antibiotics, and Sulfamethoxazole    Review of Systems  Updated Vital Signs BP (!) 151/76   Pulse 78   Temp 98.2 F (36.8 C)   Resp 16   Ht 5' 7 (1.702 m)   Wt 66.7 kg   SpO2 97%   BMI 23.02 kg/m   Physical Exam Constitutional:      General: He is not in acute distress.    Appearance: Normal appearance. He is not ill-appearing.  Eyes:     Extraocular Movements: Extraocular movements intact.     Conjunctiva/sclera: Conjunctivae normal.     Pupils: Pupils are equal, round, and reactive to light.  Cardiovascular:     Rate and Rhythm:  Normal rate and regular rhythm.     Pulses: Normal pulses.     Heart sounds: Normal heart sounds.  Pulmonary:     Effort: Pulmonary effort is normal.     Breath sounds: Normal breath sounds.  Neurological:     Mental Status: He is alert.     Comments: Patient oriented to self only.  Not able to open and close eyes and open and close hands on command.  Receptive aphasia noted.  Noncompliant with much of the neurologic exam.  No significant facial droop noted.  No upper extremity drift.  No lower extremity drift.  Intact sensation to light touch in the face, arms, and legs.     (all labs ordered are listed, but only abnormal results are displayed) Labs  Reviewed  CBC - Abnormal; Notable for the following components:      Result Value   RBC 3.88 (*)    Hemoglobin 12.5 (*)    HCT 37.5 (*)    All other components within normal limits  COMPREHENSIVE METABOLIC PANEL WITH GFR - Abnormal; Notable for the following components:   Glucose, Bld 191 (*)    All other components within normal limits  I-STAT CHEM 8, ED - Abnormal; Notable for the following components:   Chloride 97 (*)    Glucose, Bld 190 (*)    Calcium , Ion 1.14 (*)    Hemoglobin 12.6 (*)    HCT 37.0 (*)    All other components within normal limits  PROTIME-INR  APTT  DIFFERENTIAL  ETHANOL  URINE DRUG SCREEN    EKG: None  Radiology: CT HEAD WO CONTRAST ( ) Result Date: 07/10/2024 EXAM: CT HEAD WITHOUT CONTRAST 07/10/2024 11:19:36 AM TECHNIQUE: CT of the head was performed without the administration of intravenous contrast. Automated exposure control, iterative reconstruction, and/or weight based adjustment of the mA/kV was utilized to reduce the radiation dose to as low as reasonably achievable. COMPARISON: CT head and CTA head and neck earlier today. CLINICAL HISTORY: 89 year old male, acute neurological deficit, stroke suspected, presenting this morning. FINDINGS: BRAIN AND VENTRICLES: No acute hemorrhage. No hydrocephalus. No extra-axial collection. No mass effect or midline shift. Calcified atherosclerosis at the skull base. Residual intravascular contrast. Stable gray white differentiation. No acute cortically based infarct identified. Patchy cerebral white matter hypodensity with some deep gray nuclei heterogeneity. No abnormal enhancement identified. ORBITS: No gaze deviation. SINUSES: No acute abnormality. SOFT TISSUES AND SKULL: No acute soft tissue abnormality. No skull fracture. IMPRESSION: 1. Residual intravascular contrast, otherwise stable CT appearance of the brain. No acute or evolving infarct is identified. Electronically signed by: Helayne Hurst MD 07/10/2024 11:24  AM EST RP Workstation: [id number]   CT ANGIO HEAD NECK W WO CM (CODE STROKE) Result Date: 07/10/2024 EXAM: CTA HEAD AND NECK WITH AND WITHOUT 07/10/2024 10:25:08 AM TECHNIQUE: CTA of the head and neck was performed with and without the administration of intravenous contrast. Multiplanar 2D and/or 3D reformatted images are provided for review. Automated exposure control, iterative reconstruction, and/or weight based adjustment of the mA/kV was utilized to reduce the radiation dose to as low as reasonably achievable. Stenosis of the internal carotid arteries measured using NASCET criteria. COMPARISON: CT head reported separately today. CTA chest November 30, 2023. CLINICAL HISTORY: 89 year old male with acute neuro deficit, stroke suspected. FINDINGS: CTA NECK: AORTIC ARCH AND ARCH VESSELS: Tortuous distal aortic arch. Calcified arch atherosclerosis. 4 vessel arch configuration, the left vertebral artery arises directly from the arch (normal variant). CERVICAL CAROTID ARTERIES:  Mild for age right ICA origin and bulb calcified atherosclerosis without stenosis. Right ICA siphon moderate calcified atherosclerosis with no significant stenosis. Mild left CCA atherosclerosis without stenosis. Confluent calcified plaque at the left carotid bifurcation, left ICA origin and bulb, with left ICA measuring 47% stenosis. Left ICA siphon moderate calcified plaque with mild supraclinoid segment stenosis. CERVICAL VERTEBRAL ARTERIES: Calcified plaque at the right vertebral artery origin with less than 50% stenosis. Mildly dominant right vertebral artery. Mild right V4 soft and calcified plaque without significant stenosis. Calcified plaque at the vertebrobasilar junction which remains patent, mild stenosis. Nondominant left vertebral artery arises directly from the arch with mild origin calcified plaque, no stenosis. Left V4 segment mild to moderate atherosclerosis and stenosis, patent left vertebrobasilar junction. LUNGS AND  MEDIASTINUM: Visible upper lungs and mediastinum appear negative. SOFT TISSUES: Nonvascular neck soft tissues are negative. BONES: Ordinary degeneration in the spine. Mild parathoracic superior endplate compression at T2 and T4 is chronic. CTA HEAD: ANTERIOR CIRCULATION: Normal left posterior communicating artery origin. Normal right PCA and bilateral SCA origins. Right posterior communicating artery is diminutive or absent. Normal MCA and ACA origins. Normal anterior communicating artery. Mild left MCA M1 irregularity, no significant stenosis. POSTERIOR CIRCULATION: Bilateral AICA vessels appear dominant, normal variant. No basilar stenosis. Moderate left PCA P2 segment stenosis. Similar moderate right P2 segment stenosis, no PCA branch occlusion identified. INTRACRANIAL VENOUS: Superior sagittal sinus is enhancing and appears patent. IMPRESSION: 1. No large vessel occlusion. Extracranial atherosclerosis with no significant stenosis. Intracranial atherosclerosis with up to moderate stenoses of the bilateral PCA (p2 segments), nondominant left vertebral v4 segment. 2. Aortic Atherosclerosis (ICD10-I70.0). 3. Salient findings communicated to Dr. Germaine at 10:33 AM on 07/10/2024 by text page via the [facility] messaging system. Electronically signed by: Helayne Hurst MD 07/10/2024 10:36 AM EST RP Workstation: [id number]   CT HEAD CODE STROKE WO CONTRAST (LKW 0-4.5h, LVO 0-24h) Result Date: 07/10/2024 EXAM: CT HEAD WITHOUT CONTRAST 07/10/2024 09:20:09 AM TECHNIQUE: CT of the head was performed without the administration of intravenous contrast. Automated exposure control, iterative reconstruction, and/or weight based adjustment of the mA/kV was utilized to reduce the radiation dose to as low as reasonably achievable. COMPARISON: Head CT and brain MRI on 11/29/2023. CLINICAL HISTORY: 89 year old male with code stroke presentation. FINDINGS: BRAIN AND VENTRICLES: No acute hemorrhage. No evidence of acute infarct. No  hydrocephalus. No extra-axial collection. No mass effect or midline shift. Stable brain volume. Patchy and confluent cerebral white matter hypodensity in both hemispheres. Some deep white matter capsule involvement. Gray-white differentiation is stable. ORBITS: No gaze deviation. SINUSES: Paranasal sinuses, tympanic cavities, and mastoids are well aerated. SOFT TISSUES AND SKULL: No acute soft tissue abnormality. No skull fracture. Advanced calcified atherosclerosis at the skull base. VASCULATURE: No suspicious intracranial vascular hyperdensity. ALBERTA STROKE PROGRAM EARLY CT SCORE (ASPECTS): Ganglionic (caudate, IC, lentiform nucleus, insula, M1-M3): 7 Supraganglionic (M4-M6): 3 Total: 10 These results were communicated to Dr. Germaine at 09:42 hours on 07/10/2024 by text page via the [facility] messaging system. IMPRESSION: 1. Stable non-contrast CT appearance of the brain.  ASPECTS 10. 2. These results were communicated to Dr. Germaine at [phone] hours on 07/10/2024 by text page via the [facility] messaging system. Electronically signed by: Helayne Hurst MD 07/10/2024 09:45 AM EST RP Workstation: [id number]     Procedures   Medications Ordered in the ED  tenecteplase  (TNKASE ) injection for stroke 17 mg (17 mg Intravenous Given 07/10/24 0952)  iohexol  (OMNIPAQUE ) 350 MG/ML injection 75 mL (75 mLs Intravenous Contrast  Given 07/10/24 1011)    Clinical Course as of 07/10/24 1131  Wed Jul 10, 2024  1102 Dr Emil accepts for ED to ED transfer [RP]    Clinical Course User Index [RP] Yolande Lamar BROCKS, MD                                 Medical Decision Making Amount and/or Complexity of Data Reviewed Labs: ordered. Radiology: ordered.  Risk Prescription drug management. Decision regarding hospitalization.   Shawn Stephenson is a 89 year old male history of CAD on aspirin  and Plavix , diabetes, hypertension, hyperlipidemia, and heart failure with preserved ejection fraction who presents to the emergency  department difficulty speaking.  Initial Ddx:  Stroke, ICH, hypoglycemia, meningitis/infection  MDM:  Concerned about a stroke given the patient's aphasia and difficulty following commands.  No upper extremity drift so low concern for LVO at this point in time.  Patient is protecting airway at this time.  Code stroke activated in the field and is within the window for TNK.  Evaluated by Dr. Germaine from neurology.  Will check a CBG to ensure that the patient is not hypoglycemic and a CT of the head to evaluate for ICH.  No signs of infection currently on history or exam to suggest an infectious cause of their symptoms.  Plan:  CBG Labs Code stroke activation CT head  ED Summary/Re-evaluation:  CT head without acute findings.  Blood sugar 190.  Patient received TNK.  Per nursing started becoming slightly less interactive and so repeat head CT was obtained which does not any bleeding post TNK.  Patient appears to be unchanged from prior exam on my repeat evaluation.  Neurology will admit the patient to the ICU under Dr. Voncile.  Will transfer the patient to the [facility] ED while he awaits his ICU bed.  This patient presents to the ED for concern of complaints listed in HPI, this involves an extensive number of treatment options, and is a complaint that carries with it a high risk of complications and morbidity. Disposition including potential need for admission considered.   Dispo: ICU  Additional history obtained from EMS Records reviewed Outpatient Clinic Notes The following labs were independently interpreted: Chemistry and show no acute abnormality I independently reviewed the following imaging with scope of interpretation limited to determining acute life threatening conditions related to emergency care: CT Head and agree with the radiologist interpretation with the following exceptions: none I personally reviewed and interpreted cardiac monitoring: normal sinus rhythm  I personally  reviewed and interpreted the pt's EKG: see above for interpretation  I have reviewed the patients home medications and made adjustments as needed Consults: Neurology Social Determinants of health:  Geriatric  CRITICAL CARE Performed by: Lamar BROCKS Yolande   Total critical care time: 45 minutes  Critical care time was exclusive of separately billable procedures and treating other patients.  Critical care was necessary to treat or prevent imminent or life-threatening deterioration.  Critical care was time spent personally by me on the following activities: development of treatment plan with patient and/or surrogate as well as nursing, discussions with consultants, evaluation of patient's response to treatment, examination of patient, obtaining history from patient or surrogate, ordering and performing treatments and interventions, ordering and review of laboratory studies, ordering and review of radiographic studies, pulse oximetry and re-evaluation of patient's condition.    Final diagnoses:  Cerebrovascular accident (CVA), unspecified mechanism (HCC)  Aphasia    ED Discharge Orders     None          Yolande Lamar BROCKS, MD 07/10/24 1131  "

## 2024-07-10 NOTE — ED Notes (Signed)
 ED Provider at bedside.

## 2024-07-10 NOTE — ED Notes (Signed)
Attempted to call report, unable to reach receiving RN

## 2024-07-10 NOTE — ED Triage Notes (Signed)
 Code stroke called in the field by EMS.

## 2024-07-10 NOTE — ED Notes (Signed)
 Cup of water brought to patient's mouth, pt made no attempt to drink water. Significant change in status noticed. Unable to follow any commands. All verbal responses are inappropriate words.

## 2024-07-10 NOTE — ED Triage Notes (Addendum)
 Pt transferred from [facility] via [place] d/t an acute Neuro change after TNK. Initial LKN 0800, lives alone & noted speech difficulty, no weakness, NIH there was 7, CT head negative, TNK given at 0952. Acute neuro change noted at 1045, pt was holding cup of water, not following commands, confusion noted & worsening aphasia (per [facility]). Repeat CT head showed no bleed, pt is ow speaking more but is repetitive with words, no facial droop & still now extremity weakness upon arrival to [facility] ED.

## 2024-07-10 NOTE — Evaluation (Signed)
 Clinical/Bedside Swallow Evaluation Patient Details  Name: Shawn Stephenson MRN: [id number] Date of Birth: 22-Aug-1929  Today's Date: 07/10/2024 Time: SLP Start Time (ACUTE ONLY): 1621 SLP Stop Time (ACUTE ONLY): 1636 SLP Time Calculation (min) (ACUTE ONLY): 15 min  Past Medical History:  Past Medical History:  Diagnosis Date   Asymptomatic PVCs    Diabetes mellitus    well controlled   Diastolic dysfunction    Hyperlipidemia    Hypertension    Mitral regurgitation    mild   Past Surgical History:  Past Surgical History:  Procedure Laterality Date   INTRAMEDULLARY (IM) NAIL INTERTROCHANTERIC Right 12/09/2017   Procedure: INTRAMEDULLARY (IM) NAIL INTERTROCHANTRIC;  Surgeon: Fidel Rogue, MD;  Location: [facility] OR;  Service: Orthopedics;  Laterality: Right;   LEFT HEART CATH AND CORONARY ANGIOGRAPHY N/A 12/01/2023   Procedure: LEFT HEART CATH AND CORONARY ANGIOGRAPHY;  Surgeon: Verlin Lonni BIRCH, MD;  Location: [facility] INVASIVE CV LAB;  Service: Cardiovascular;  Laterality: N/A;   HPI:    Shawn Stephenson is a 89 y.o. male who presented to [facility] from home as a code stroke on 07/10/24. Upon EMS arrival, he was noted to have right facial droop and aphasia. He received TNK at [facility] and transferred to [facility]. CTH did not show an acute or evolving infarct. MRI brain pending. He failed [facility] with RN and SLP ordered for swallow evaluation. He was hospitalized in June of 2025 for suspected stroke with MRI reporting: No acute intracranial abnormality. Per daughter, patient does not have any h/o dysphagia, cognitive, language or speech impairments. He lives at home with his spouse and they have hired forensic scientist for physical limitations) with children present when caregivers are not. PMH: HTN, DM-2, HLD, HFpEF.    Assessment / Plan / Recommendation  Clinical Impression  Rec: dys 3, thin. SLP will follow for toleration and ability to advance.  Patient did not exhibit any overt s/s of aspiration with PO  intake of thin liquids or puree solids. He exhibited ?ideational apraxia when unable to use spoon to eat applesauce and resorting to drinking applesauce from cup. He required setup assitance and cues for him to initiate holding cup of water but he was then able to drink independently, taking small, individual sips each time. SLP is recommending to initiate mechanical soft solids (dys 3), thin liquids with full supervision secondary to potential cognitive impairment versus apraxia which could potentially lead to poor safety with PO's (especially with solids).  SLP Visit Diagnosis: Dysphagia, unspecified (R13.10)    Aspiration Risk  Mild aspiration risk    Diet Recommendation Dysphagia 3 (Mech soft);Thin liquid    Liquid Administration via: Cup;Straw Medication Administration: Other (Comment) (whole if small, crushed if large) Supervision: Patient able to self feed;Full supervision/cueing for compensatory strategies Compensations: Slow rate;Small sips/bites Postural Changes: Seated upright at 90 degrees    Other Recommendations Oral Care Recommendations: Oral care BID     Swallow Evaluation Recommendations     Assistance Recommended at Discharge    Functional Status Assessment Patient has had a recent decline in their functional status and demonstrates the ability to make significant improvements in function in a reasonable and predictable amount of time.  Frequency and Duration min 2x/week  1 week       Prognosis Prognosis for improved oropharyngeal function: Good      Swallow Study   General Date of Onset: 07/10/24 Type of Study: Bedside Swallow Evaluation Previous Swallow Assessment: none found Diet Prior to this Study: NPO Temperature Spikes  Noted: No Respiratory Status: Room air History of Recent Intubation: No Behavior/Cognition: Alert;Cooperative;Confused;Requires cueing Oral Cavity Assessment: Within Functional Limits Oral Care Completed by SLP: No Oral Cavity -  Dentition: Adequate natural dentition Vision: Functional for self-feeding Self-Feeding Abilities: Needs set up;Needs assist Patient Positioning: Upright in bed Baseline Vocal Quality: Normal Volitional Cough: Cognitively unable to elicit Volitional Swallow: Unable to elicit    Oral/Motor/Sensory Function Overall Oral Motor/Sensory Function: Other (comment) (unable to follow commands but no focal weakness or obvious facial droop observed)   Ice Chips     Thin Liquid Thin Liquid: Within functional limits Presentation: Cup;Self Fed    Nectar Thick     Honey Thick     Puree Puree: Within functional limits Presentation: Self Fed   Solid     Solid: Not tested Other Comments: patient declined      Norleen IVAR Blase, MA, CCC-SLP Speech Therapy  07/10/2024,5:37 PM

## 2024-07-10 NOTE — H&P (Addendum)
 NEUROLOGY H&P NOTE   Date of service: July 10, 2024 Patient Name: Shawn Stephenson MRN:  [id number] DOB:  05-Mar-1930 Chief Complaint: Code Stroke Transfer  History of Present Illness  Shawn Stephenson is a 89 y.o. adult with a PMHx  of CAD on aspirin  and clopidogrel , T2DM, HTN, HLD, and HFpEF who presented with acute onset aphasia. He was last known well at approximately 0700, and developed difficulty speaking around 0800 while finishing a shower. On EMS arrival, he was noted to have right-sided facial droop and aphasia and was treated as a code stroke at [facility], where he received TNK. He was subsequently transferred to [facility] via [facility] following an acute neurologic change after thrombolysis.  Last known well: 0700 (patient awoke at baseline per report) Modified rankin score: 0-Completely asymptomatic and back to baseline post- stroke lives alone, independent at baseline IV Thrombolysis: Yes-- TNK administered at 0952 at [facility] for acute aphasia/facial droop within treatment window Thrombectomy: No - No evidence of large vessel occlusion; patient with partial clinical improvement and no focal motor deficit on arrival ICH Score: 0 NIHSS components Score: Comment  1a Level of Conscious 0[x]  1[]  2[]  3[]      1b LOC Questions 0[]  1[]  2[x]     Unable to answer orientation questions due to severe aphasia  1c LOC Commands 0[x]  1[]  2[]       2 Best Gaze 0[x]  1[]  2[]       3 Visual 0[x]  1[]  2[]  3[]      4 Facial Palsy 0[x]  1[]  2[]  3[]      5a Motor Arm - left 0[]  1[x]  2[]  3[]  4[]  UN[]    5b Motor Arm - Right 0[]  1[x]  2[]  3[]  4[]  UN[]    6a Motor Leg - Left 0[x]  1[]  2[]  3[]  4[]  UN[]    6b Motor Leg - Right 0[x]  1[]  2[]  3[]  4[]  UN[]    7 Limb Ataxia 0[x]  1[]  2[]  UN[]      8 Sensory 0[x]  1[]  2[]  UN[]      9 Best Language 0[]  1[]  2[]  3[x]     Severe aphasia with word salad  10 Dysarthria 0[x]  1[]  2[]  UN[]      11 Extinct. and Inattention 0[x]  1[]  2[]       TOTAL:  7       ROS  Unable to assess  due to severe aphasia, word salad  Past History   Past Medical History:  Diagnosis Date   Asymptomatic PVCs    Diabetes mellitus    well controlled   Diastolic dysfunction    Hyperlipidemia    Hypertension    Mitral regurgitation    mild   Past Surgical History:  Procedure Laterality Date   INTRAMEDULLARY (IM) NAIL INTERTROCHANTERIC Right 12/09/2017   Procedure: INTRAMEDULLARY (IM) NAIL INTERTROCHANTRIC;  Surgeon: Fidel Rogue, MD;  Location: [facility] OR;  Service: Orthopedics;  Laterality: Right;   LEFT HEART CATH AND CORONARY ANGIOGRAPHY N/A 12/01/2023   Procedure: LEFT HEART CATH AND CORONARY ANGIOGRAPHY;  Surgeon: Verlin Lonni BIRCH, MD;  Location: [facility] INVASIVE CV LAB;  Service: Cardiovascular;  Laterality: N/A;   Family History  Problem Relation Age of Onset   Stroke Father    Diabetes Father    Hypertension Father    Hypertension Mother    Arthritis Mother    Hypertension Brother    Stroke Brother    Kidney disease Brother    Social History   Socioeconomic History   Marital status: Married    Spouse name: Not on file   Number of children:  Not on file   Years of education: Not on file   Highest education level: Not on file  Occupational History   Not on file  Tobacco Use   Smoking status: Never   Smokeless tobacco: Never  Vaping Use   Vaping status: Never Used  Substance and Sexual Activity   Alcohol use: No   Drug use: No   Sexual activity: Not on file  Other Topics Concern   Not on file  Social History Narrative   Not on file   Social Drivers of Health   Tobacco Use: Low Risk (07/10/2024)   Patient History    Smoking Tobacco Use: Never    Smokeless Tobacco Use: Never    Passive Exposure: Not on file  Financial Resource Strain: Not on file  Food Insecurity: No Food Insecurity (11/30/2023)   Epic    Worried About Programme Researcher, Broadcasting/film/video in the Last Year: Never true    Ran Out of Food in the Last Year: Never true  Transportation Needs: No  Transportation Needs (11/30/2023)   Epic    Lack of Transportation (Medical): No    Lack of Transportation (Non-Medical): No  Physical Activity: Not on file  Stress: Not on file  Social Connections: Socially Integrated (11/30/2023)   Social Connection and Isolation Panel    Frequency of Communication with Friends and Family: More than three times a week    Frequency of Social Gatherings with Friends and Family: More than three times a week    Attends Religious Services: More than 4 times per year    Active Member of Clubs or Organizations: Yes    Attends Banker Meetings: More than 4 times per year    Marital Status: Married  Depression (PHQ2-9): Not on file  Alcohol Screen: Not on file  Housing: Low Risk (11/30/2023)   Epic    Unable to Pay for Housing in the Last Year: No    Number of Times Moved in the Last Year: 0    Homeless in the Last Year: No  Utilities: Not At Risk (11/30/2023)   Epic    Threatened with loss of utilities: No  Health Literacy: Not on file   Allergies[1]  Medications  Current Medications[2]   Vitals   Vitals:   07/10/24 1130 07/10/24 1145 07/10/24 1200 07/10/24 1315  BP: (!) 158/84 (!) 130/95 (!) 158/75 (!) 165/82  Pulse: 83 89 80 82  Resp: (!) 21 17 13 11   Temp:      TempSrc:      SpO2: 96% 93% 98% 100%  Weight:      Height:         Body mass index is 23.02 kg/m.  Physical Exam   Constitutional: Appears well-developed and well-nourished.  Head: Normocephalic.  Cardiovascular: Normal rate and regular rhythm.  Respiratory: Effort normal, non-labored breathing.  GI: Soft.  No distension.  Skin: WDI.   Neurologic Examination   Neuro: Mental Status: Patient is awake but unable to answer any orientation questions or give any history Severe aphasia word salad. Able to follow commands Cranial Nerves: II: UNA visual fields. Pupils are equal, round, and reactive to light.  III,IV, VI: EOMI  V: UNA facial sensation VII: Facial  movement is symmetric.  VIII: Hearing is intact to voice X: Uvula elevates symmetrically XII: tongue is midline without atrophy or fasciculations.  Motor: Tone is normal. Bulk is normal. 5/5 strength was present in BLEs, 4/5 in BLUs Sensory: UNA sensation Deep Tendon  Reflexes: 2+ and symmetric in the biceps and patellae.    Labs   CBC:  Recent Labs  Lab 07/10/24 0912 07/10/24 0915  WBC 5.7  --   NEUTROABS 4.2  --   HGB 12.5* 12.6*  HCT 37.5* 37.0*  MCV 96.6  --   PLT 250  --    Basic Metabolic Panel:  Lab Results  Component Value Date   NA 137 07/10/2024   K 3.8 07/10/2024   CO2 24 07/10/2024   GLUCOSE 190 (H) 07/10/2024   BUN 17 07/10/2024   CREATININE 0.90 07/10/2024   CALCIUM  9.0 07/10/2024   GFRNONAA >60 07/10/2024   GFRAA >60 12/11/2017   Lipid Panel:  Lab Results  Component Value Date   LDLCALC 82 11/30/2023   HgbA1c:  Lab Results  Component Value Date   HGBA1C 7.9 (H) 11/30/2023   Urine Drug Screen:     Component Value Date/Time   LABOPIA NONE DETECTED 11/29/2023 1751   COCAINSCRNUR NONE DETECTED 11/29/2023 1751   LABBENZ NONE DETECTED 11/29/2023 1751   AMPHETMU NONE DETECTED 11/29/2023 1751   THCU NONE DETECTED 11/29/2023 1751   LABBARB NONE DETECTED 11/29/2023 1751    Alcohol Level     Component Value Date/Time   ETH <15 07/10/2024 0912   INR  Lab Results  Component Value Date   INR 1.1 07/10/2024   APTT  Lab Results  Component Value Date   APTT 31 07/10/2024     CT Head without contrast(Personally reviewed): Stable non-contrast CT appearance of the brain. ASPECTS 10.  Repeat: Residual intravascular contrast, otherwise stable CT appearance of the brain. No acute or evolving infarct is identified.  CT angio Head and Neck with contrast(Personally reviewed): No large vessel occlusion. Extracranial atherosclerosis with no significant stenosis. Intracranial atherosclerosis with up to moderate stenoses of the bilateral PCA (p2  segments), nondominant left vertebral v4 segment. Aortic Atherosclerosis   MRI Brain(Personally reviewed): Pending   Assessment   Shawn Stephenson is a 12 y.o. adult with a PMHx  of CAD on aspirin  and clopidogrel , T2DM, HTN, HLD, and HFpEF who presented with acute onset aphasia. He was last known well at approximately 0700, and developed difficulty speaking around 0800 while finishing a shower. On EMS arrival, he was noted to have right-sided facial droop and aphasia and was treated as a code stroke at [facility], where he received TNK. He was subsequently transferred to [facility] via [facility] following an acute neurologic change after thrombolysis.  On arrival, the patient demonstrated severe expressive language dysfunction with word salad and inability to answer orientation questions, though he was awake and able to follow commands, with no facial droop and no clear focal motor deficits.  Recommendations  -Admit to Neuro ICU - Neurochecks and vitals per the post TNK protocol -Blood pressure goal below 180/105 -To maintain the blood pressures, use as needed labetalol /hydralazine  and if there is a need for a drip, use Cleviprex - No antiplatelets or anticoagulants for 24 hours from TNK -MRI brain at 24 hours -2d echo -A1c -fasting lipid panel -PT OT ST -NPO until cleared by bedside or formal swallow eval  -CXR, UA - Gentle hydration -Check labs in the morning -Replete electrolytes as necessary -Maintain normothermia - Sliding scale insulin   DVT prophylaxis: SCDs only-no antiplatelets or anticoagulants for 24 hours from TNK. Doc/senna PPI  Discussed CODE STATUS with the son-he is a DNR.  ______________________________________________________________________   Signed, Alan Maiden, MD Resident Physician  Attending Neurohospitalist Addendum Patient seen  and examined with APP/Resident. Agree with the history and physical as documented above. Agree with the plan as documented,  which I helped formulate. I have independently reviewed the chart, obtained history, review of systems and examined the patient.I have personally reviewed pertinent head/neck/spine imaging (CT/MRI). Please feel free to call with any questions.  -- Eligio Lav, MD Neurologist [facility] Pager: [phone]     [1]  Allergies Allergen Reactions   Enalapril Maleate Other (See Comments)    Swelling **Vasotec**   Losartan  Other (See Comments)    Makes him feel jittery   Menthol Itching   Shellfish Allergy    Sulfasalazine Other (See Comments)    sulfasalazine   Sulfa Antibiotics Other (See Comments)    Doesn't recall reaction   Sulfamethoxazole Other (See Comments)    Doesn't recall reaction  [2]  Current Facility-Administered Medications:    [START ON 07/11/2024]  stroke: early stages of recovery book, , Does not apply, Once, Remi Pippin, NP   0.9 %  sodium chloride  infusion, , Intravenous, Continuous, Shafer, Devon, NP   acetaminophen  (TYLENOL ) tablet 650 mg, 650 mg, Oral, Q4H PRN **OR** acetaminophen  (TYLENOL ) 160 MG/5ML solution 650 mg, 650 mg, Per Tube, Q4H PRN **OR** acetaminophen  (TYLENOL ) suppository 650 mg, 650 mg, Rectal, Q4H PRN, Remi, Devon, NP   pantoprazole  (PROTONIX ) injection 40 mg, 40 mg, Intravenous, QHS, Shafer, Devon, NP   senna-docusate (Senokot-S) tablet 1 tablet, 1 tablet, Oral, QHS PRN, Remi Pippin, NP  Current Outpatient Medications:    ACCU-CHEK AVIVA PLUS test strip, , Disp: , Rfl:    ACCU-CHEK SOFTCLIX LANCETS lancets, , Disp: , Rfl:    amLODipine  (NORVASC ) 10 MG tablet, Take 1 tablet (10 mg total) by mouth daily., Disp: 90 tablet, Rfl: 3   aspirin  81 MG chewable tablet, Chew 1 tablet (81 mg total) by mouth daily., Disp: 60 tablet, Rfl: 1   atorvastatin  (LIPITOR) 40 MG tablet, Take 1 tablet (40 mg total) by mouth daily., Disp: 90 tablet, Rfl: 3   Blood Glucose Monitoring Suppl (ACCU-CHEK AVIVA PLUS) W/DEVICE KIT, , Disp: , Rfl:     carvedilol  (COREG ) 6.25 MG tablet, TAKE ONE TABLET BY MOUTH TWICE A DAY WITH A MEAL, Disp: 60 tablet, Rfl: 7   Cholecalciferol  (VITAMIN D-3) 1000 UNITS CAPS, Take 1,000 Units by mouth daily., Disp: , Rfl:    clopidogrel  (PLAVIX ) 75 MG tablet, Take 1 tablet (75 mg total) by mouth daily., Disp: 90 tablet, Rfl: 3   Cyanocobalamin  (VITAMIN B 12 PO), Take 1,000 mcg by mouth daily. , Disp: , Rfl:    fexofenadine (ALLEGRA) 180 MG tablet, Take 180 mg by mouth daily., Disp: , Rfl:    glipiZIDE  (GLUCOTROL ) 5 MG 24 hr tablet, Take 5 mg by mouth daily., Disp: , Rfl:    MAGNESIUM  PO, Take 400 mg by mouth daily., Disp: , Rfl:    nitroGLYCERIN  (NITROSTAT ) 0.4 MG SL tablet, Place 1 tablet (0.4 mg total) under the tongue every 5 (five) minutes as needed for chest pain., Disp: 90 tablet, Rfl: 3   pyridOXINE  (VITAMIN B6) 25 MG tablet, Take 25 mg by mouth daily., Disp: , Rfl:    tamsulosin  (FLOMAX ) 0.4 MG CAPS capsule, TAKE ONE CAPSULE (0.4MG  TOTAL) BY MOUTH DAILY, Disp: 90 capsule, Rfl: 3

## 2024-07-10 NOTE — Consult Note (Signed)
 [facility] Telemedicine Consult   Requesting Provider: Yolande Consult Participants: ED physician, nurse Location of the provider: [facility] Location of the patient: [facility] ED  This consult was provided via telemedicine with 2-way video and audio communication. The patient/family was informed that care would be provided in this way and agreed to receive care in this manner.    Chief Complaint: Difficulty with speech, facial droop  HPI: 89 year old male with a history of mitral regurgitation, DM, HTN, HLD who reports awakening this AM at baseline.  On returning from the shower noted that he was unable to speak.  EMS was called.  They noted a facial droop as well.  Patient was brought in as a code stroke.   LKW: 0700 on 07/10/2024 tnk given?: Yes IR Thrombectomy? No, No target lesion identified Modified Rankin Scale: 0-Completely asymptomatic and back to baseline post- stroke Time of teleneurologist evaluation: 0913  Exam: Vitals:   07/10/24 0935  BP: (!) 151/89  Pulse: 80  Resp: 18  Temp: 98.2 F (36.8 C)  SpO2: 99%    General: NAD  1A: Level of Consciousness - 0 1B: Ask Month and Age - 2 1C: 'Blink Eyes' & 'Squeeze Hands' - 1 2: Test Horizontal Extraocular Movements - 0 3: Test Visual Fields - 2 4: Test Facial Palsy - 1 5A: Test Left Arm Motor Drift - 0 5B: Test Right Arm Motor Drift - 0 6A: Test Left Leg Motor Drift - 0 6B: Test Right Leg Motor Drift - 0 7: Test Limb Ataxia - 0 8: Test Sensation - 0 9: Test Language/Aphasia- 1 10: Test Dysarthria - 0 11: Test Extinction/Inattention - 0 NIHSS score: 7   Imaging Reviewed:  CT HEAD WITHOUT CONTRAST 07/10/2024 09:20:09 AM   TECHNIQUE: CT of the head was performed without the administration of intravenous contrast. Automated exposure control, iterative reconstruction, and/or weight based adjustment of the mA/kV was utilized to reduce the radiation dose to as low as reasonably achievable.    COMPARISON: Head CT and brain MRI on 11/29/2023.   CLINICAL HISTORY: 89 year old male with code stroke presentation.   FINDINGS:   BRAIN AND VENTRICLES: No acute hemorrhage. No evidence of acute infarct. No hydrocephalus. No extra-axial collection. No mass effect or midline shift. Stable brain volume. Patchy and confluent cerebral white matter hypodensity in both hemispheres. Some deep white matter capsule involvement. Gray-white differentiation is stable.   ORBITS: No gaze deviation.   SINUSES: Paranasal sinuses, tympanic cavities, and mastoids are well aerated.   SOFT TISSUES AND SKULL: No acute soft tissue abnormality. No skull fracture. Advanced calcified atherosclerosis at the skull base.   VASCULATURE: No suspicious intracranial vascular hyperdensity.   ALBERTA STROKE PROGRAM EARLY CT SCORE (ASPECTS): Ganglionic (caudate, IC, lentiform nucleus, insula, M1-M3): 7 Supraganglionic (M4-M6): 3 Total: 10   These results were communicated to Dr. Germaine at 09:42 hours on 07/10/2024 by text page via the [facility] messaging system.   IMPRESSION: 1. Stable non-contrast CT appearance of the brain.  ASPECTS 10. 2. These results were communicated to Dr. Germaine at [phone] hours on 07/10/2024 by text page via the [facility] messaging system.  CTA HEAD AND NECK WITH AND WITHOUT 07/10/2024 10:25:08 AM   TECHNIQUE: CTA of the head and neck was performed with and without the administration of intravenous contrast. Multiplanar 2D and/or 3D reformatted images are provided for review. Automated exposure control, iterative reconstruction, and/or weight based adjustment of the mA/kV was utilized to reduce the radiation dose to as low as  reasonably achievable. Stenosis of the internal carotid arteries measured using NASCET criteria.   COMPARISON: CT head reported separately today. CTA chest November 30, 2023.   CLINICAL HISTORY: 89 year old male with acute neuro deficit, stroke suspected.    FINDINGS:   CTA NECK: AORTIC ARCH AND ARCH VESSELS: Tortuous distal aortic arch. Calcified arch atherosclerosis. 4 vessel arch configuration, the left vertebral artery arises directly from the arch (normal variant).   CERVICAL CAROTID ARTERIES: Mild for age right ICA origin and bulb calcified atherosclerosis without stenosis. Right ICA siphon moderate calcified atherosclerosis with no significant stenosis. Mild left CCA atherosclerosis without stenosis. Confluent calcified plaque at the left carotid bifurcation, left ICA origin and bulb, with left ICA measuring 47% stenosis. Left ICA siphon moderate calcified plaque with mild supraclinoid segment stenosis.   CERVICAL VERTEBRAL ARTERIES: Calcified plaque at the right vertebral artery origin with less than 50% stenosis. Mildly dominant right vertebral artery. Mild right V4 soft and calcified plaque without significant stenosis. Calcified plaque at the vertebrobasilar junction which remains patent, mild stenosis. Nondominant left vertebral artery arises directly from the arch with mild origin calcified plaque, no stenosis. Left V4 segment mild to moderate atherosclerosis and stenosis, patent left vertebrobasilar junction.   LUNGS AND MEDIASTINUM: Visible upper lungs and mediastinum appear negative.   SOFT TISSUES: Nonvascular neck soft tissues are negative.   BONES: Ordinary degeneration in the spine. Mild parathoracic superior endplate compression at T2 and T4 is chronic.   CTA HEAD: ANTERIOR CIRCULATION: Normal left posterior communicating artery origin. Normal right PCA and bilateral SCA origins. Right posterior communicating artery is diminutive or absent. Normal MCA and ACA origins. Normal anterior communicating artery. Mild left MCA M1 irregularity, no significant stenosis.   POSTERIOR CIRCULATION: Bilateral AICA vessels appear dominant, normal variant. No basilar stenosis. Moderate left PCA P2 segment stenosis. Similar  moderate right P2 segment stenosis, no PCA branch occlusion identified.   INTRACRANIAL VENOUS: Superior sagittal sinus is enhancing and appears patent.     IMPRESSION: 1. No large vessel occlusion. Extracranial atherosclerosis with no significant stenosis. Intracranial atherosclerosis with up to moderate stenoses of the bilateral PCA (p2 segments), nondominant left vertebral v4 segment. 2. Aortic Atherosclerosis (ICD10-I70.0). 3. Salient findings communicated to Dr. Germaine at 10:33 AM on 07/10/2024 by text page via the [facility] messaging system.    Labs reviewed in epic and pertinent values follow: CBG 204   Assessment: 89 year old male with a history of mitral regurgitation, DM, HTN, HLD who reports awakening this AM at baseline.  On returning from the shower noted that he was unable to speak.  EMS was called.  They noted a facial droop as well. Current NIHSS of 7.  Head CT personally reviewed and shows no acute changes. TNK administration discussed with patient.  Risks, benefits and alternatives discussed with patient.  Verbal consent obtained.  Contraindications reviewed.  TNK administered at 0952 with pre-administration BP of 174/86.  Recommendations:  Transfer to [facility] Hold all antiplatelet and anticoagulation until 24 hour post IV thrombolysis (TNKase ), neuroimaging is stable and without evidence of bleeding Repeat head CT in 24 hours. MRI of the brain without contrast Echocardiogram with bubble study A1c, fasting lipid panel.  Goal LDL<70, goal A1c<7.0 Hyperglycemia management per SSI to maintain glucose 140-180mg /dL. PT/OT/ST therapies and recommendations when able Continue frequent neuro checks as with post thrombolytic protocol.  Please call if worsening of neurological examination and perform stat CT head without contrast Permissive BP management with no treatment of SBP<200 for the first  24-48 hours.  Goal BP after that time <140/80  Telemetry Stroke swallow screen NPO  until patient passes stroke swallow screen     This patient is receiving care for possible acute neurological changes. Care by this provider at the time of service included time for direct evaluation via telemedicine, review of medical records, imaging studies and discussion of findings with providers, the patient and/or family.  Case discussed with Dr. Yolande Sonny Hock, MD Neurology   If 8pm- 8am, please page neurology on call as listed in AMION.

## 2024-07-10 NOTE — Progress Notes (Signed)
 Echocardiogram 2D Echocardiogram has been performed.  Shawn Stephenson 07/10/2024, 5:24 PM

## 2024-07-11 ENCOUNTER — Inpatient Hospital Stay (HOSPITAL_COMMUNITY)

## 2024-07-11 DIAGNOSIS — G459 Transient cerebral ischemic attack, unspecified: Secondary | ICD-10-CM

## 2024-07-11 DIAGNOSIS — R569 Unspecified convulsions: Secondary | ICD-10-CM

## 2024-07-11 DIAGNOSIS — I639 Cerebral infarction, unspecified: Secondary | ICD-10-CM | POA: Diagnosis not present

## 2024-07-11 LAB — HEMOGLOBIN A1C
Hgb A1c MFr Bld: 8.5 % — ABNORMAL HIGH (ref 4.8–5.6)
Mean Plasma Glucose: 197.25 mg/dL

## 2024-07-11 LAB — LIPID PANEL
Cholesterol: 119 mg/dL (ref 0–200)
HDL: 45 mg/dL
LDL Cholesterol: 59 mg/dL (ref 0–99)
Total CHOL/HDL Ratio: 2.7 ratio
Triglycerides: 76 mg/dL
VLDL: 15 mg/dL (ref 0–40)

## 2024-07-11 LAB — GLUCOSE, CAPILLARY
Glucose-Capillary: 96 mg/dL (ref 70–99)
Glucose-Capillary: 106 mg/dL — ABNORMAL HIGH (ref 70–99)
Glucose-Capillary: 155 mg/dL — ABNORMAL HIGH (ref 70–99)

## 2024-07-11 MED ORDER — TAMSULOSIN HCL 0.4 MG PO CAPS
0.4000 mg | ORAL_CAPSULE | Freq: Every day | ORAL | Status: DC
  Administered 2024-07-11: 0.4 mg via ORAL
  Filled 2024-07-11: qty 1

## 2024-07-11 MED ORDER — ASPIRIN 81 MG PO TBEC
81.0000 mg | DELAYED_RELEASE_TABLET | Freq: Every day | ORAL | Status: DC
  Administered 2024-07-11: 81 mg via ORAL
  Filled 2024-07-11: qty 1

## 2024-07-11 MED ORDER — ASPIRIN 81 MG PO TBEC: 81.0000 mg | DELAYED_RELEASE_TABLET | Freq: Every day | ORAL | 12 refills | Status: AC

## 2024-07-11 MED ORDER — INSULIN ASPART 100 UNIT/ML IJ SOLN: 0.0000 [IU] | Freq: Three times a day (TID) | INTRAMUSCULAR | Status: DC

## 2024-07-11 MED ORDER — ATORVASTATIN CALCIUM 40 MG PO TABS
40.0000 mg | ORAL_TABLET | Freq: Every day | ORAL | Status: DC
  Administered 2024-07-11: 40 mg via ORAL
  Filled 2024-07-11: qty 1

## 2024-07-11 MED ORDER — CLOPIDOGREL BISULFATE 75 MG PO TABS
75.0000 mg | ORAL_TABLET | Freq: Every day | ORAL | Status: DC
  Administered 2024-07-11: 75 mg via ORAL
  Filled 2024-07-11: qty 1

## 2024-07-11 NOTE — Progress Notes (Signed)
 OT Cancellation Note  Patient Details Name: Shawn Stephenson MRN: [id number] DOB: June 07, 1930   Cancelled Treatment:    Reason Eval/Treat Not Completed: Active bedrest order   Zyrell Carmean M. [place], OTR/L [facility] Acute Rehabilitation Services [phone] Secure Chat Preferred  [name] 07/11/2024, 7:30 AM

## 2024-07-11 NOTE — Progress Notes (Signed)
 EEG complete - results pending

## 2024-07-11 NOTE — Progress Notes (Signed)
 Speech Language Pathology Treatment: Dysphagia  Patient Details Name: Shawn Stephenson MRN: [id number] DOB: 10-03-1929 Today's Date: 07/11/2024 Time: 8590-8582 SLP Time Calculation (min) (ACUTE ONLY): 8 min  Assessment / Plan / Recommendation Clinical Impression  Pt has made significant improvements since yesterday with language and swallow. No s/s aspiration with multiple sips thin. Functional transit, mastication with cracker, no residual and no difficulty reported from pt/family. Upgrade to regular, continue thin, pills with thin. Pt discharging today and no f/u needed.    HPI HPI: 89 yo M adm 07/10/24 with aphasia and facial droop s/p TNK, MRI (-). PMhx: DM, HLD, Rt femur fx, HTN, mitral regurgitation, CAD, HFpEF.      SLP Plan  All goals met;Discharge SLP treatment due to (comment)        Swallow Evaluation Recommendations   Recommendations: PO diet PO Diet Recommendation: Regular;Thin liquids (Level 0) Liquid Administration via: Cup;Straw Medication Administration: Whole meds with liquid Supervision: Patient able to self-feed Postural changes: Position pt fully upright for meals Oral care recommendations: Oral care BID (2x/day)     Recommendations                     Oral care BID   None Dysphagia, unspecified (R13.10)     All goals met;Discharge SLP treatment due to (comment)     Dustin Olam Bull  07/11/2024, 2:34 PM

## 2024-07-11 NOTE — Progress Notes (Signed)
 PT Cancellation Note  Patient Details Name: Shawn Stephenson MRN: [id number] DOB: [birth year]   Cancelled Treatment:    Reason Eval/Treat Not Completed: Active bedrest order   [name] [name] 07/11/2024, 7:00 AM [name], PT Acute Rehabilitation Services Office: [phone]

## 2024-07-11 NOTE — Procedures (Signed)
 Patient Name: Shawn Stephenson  MRN: [id number]  Epilepsy Attending: Arlin MALVA Krebs  Referring Physician/Provider: Jerri Pfeiffer, MD  Date: 07/11/2024 Duration: 23.03 mins  Patient history:  89 y.o. adult who presented with acute onset aphasia. EEG to evaluate for seizure    Level of alertness: Awake, asleep  AEDs during EEG study: None  Technical aspects: This EEG study was done with scalp electrodes positioned according to the 10-20 International system of electrode placement. Electrical activity was reviewed with band pass filter of 1-70Hz , sensitivity of 7 uV/mm, display speed of 77mm/sec with a 60Hz  notched filter applied as appropriate. EEG data were recorded continuously and digitally stored.  Video monitoring was available and reviewed as appropriate.  Description: The posterior dominant rhythm consists of 8 Hz activity of moderate voltage (25-35 uV) seen predominantly in posterior head regions, symmetric and reactive to eye opening and eye closing. Sleep was characterized by vertex waves, sleep spindles (12 to 14 Hz), maximal frontocentral region. There is continuous 3 to 6 Hz theta-delta slowing in left hemisphere. Hyperventilation and photic stimulation were not performed.     ABNORMALITY - Continuous slow, left hemisphere  IMPRESSION: This study is suggestive of cortical dysfunction arising from left hemisphere likely secondary to underlying structural abnormality, post-ictal state. No seizures or epileptiform discharges were seen throughout the recording.  Korvin Valentine O Holt [place]bright

## 2024-07-11 NOTE — TOC Transition Note (Signed)
 Transition of Care [facility]) - Discharge Note   Patient Details  Name: Shawn Stephenson MRN: [id number] Date of Birth: 06-20-1930  [facility] CM/SW Contact:  Carianne Taira M, RN Phone Number: 07/11/2024, 3:49 PM   Clinical Narrative:    89 yo M adm 07/10/24 with aphasia and facial droop s/p TNK, MRI (-). PMhx: DM, HLD, Rt femur fx, HTN, mitral regurgitation, CAD, HFpEF.  PTA, pt independent and living at home with spouse, who has had recent back surgery. Patient has in home caregiver 6 days a week.  PT/OT recommending HH follow up, and patient/family agreeable to services.   Patient denies any DME needs for home.  Referral to [facility] per pt/family choice; start of care 24-48h post dc date.     Final next level of care: Home w Home Health Services Barriers to Discharge: Barriers Resolved   Patient Goals and CMS Choice   CMS Medicare.gov Compare Post Acute Care list provided to:: Patient Choice offered to / list presented to : Patient, Adult Children                            Discharge Plan and Services Additional resources added to the After Visit Summary for     Discharge Planning Services: CM Consult Post Acute Care Choice: Home Health                    HH Arranged: PT, OT [facility] Agency: [facility] Date [facility] Agency Contacted: 07/11/24 Time HH Agency Contacted: 1530 Representative spoke with at [facility] Agency: Darleene Gowda  Social Drivers of Health (SDOH) Interventions SDOH Screenings   Food Insecurity: No Food Insecurity (11/30/2023)  Housing: Low Risk (11/30/2023)  Transportation Needs: No Transportation Needs (11/30/2023)  Utilities: Not At Risk (11/30/2023)  Social Connections: Socially Integrated (11/30/2023)  Tobacco Use: Low Risk (07/10/2024)     Readmission Risk Interventions     No data to display         Mliss MICAEL Fass, RN, BSN  Trauma/Neuro ICU Case Manager [phone]

## 2024-07-11 NOTE — Evaluation (Signed)
 Speech Language Pathology Evaluation Patient Details Name: Shawn Stephenson MRN: [id number] DOB: 1930-01-17 Today's Date: 07/11/2024 Time: 8585-8576 SLP Time Calculation (min) (ACUTE ONLY): 9 min  Problem List:  Patient Active Problem List   Diagnosis Date Noted   Stroke (cerebrum) (HCC) 07/10/2024   Transient neurological symptoms 11/30/2023   Hypertension    Elevated troponin 11/29/2023   Closed comminuted intertrochanteric fracture of proximal femur, right, initial encounter (HCC) 12/09/2017   Diabetes mellitus with hyperglycemia (HCC) 12/09/2017   Closed right femoral fracture (HCC) 12/09/2017   Closed displaced intertrochanteric fracture of right femur (HCC) 12/09/2017   Fall    Postural dizziness with near syncope    Asymmetric septal hypertrophy (HCC) 05/19/2014   Mild aortic insufficiency 01/15/2013   Asymptomatic PVCs 06/01/2011   Benign hypertensive heart disease without heart failure 11/24/2010   Diabetes mellitus (HCC) 11/24/2010   Hypercholesterolemia 11/24/2010   Diastolic dysfunction, left ventricle 11/24/2010   Past Medical History:  Past Medical History:  Diagnosis Date   Asymptomatic PVCs    Diabetes mellitus    well controlled   Diastolic dysfunction    Hyperlipidemia    Hypertension    Mitral regurgitation    mild   Past Surgical History:  Past Surgical History:  Procedure Laterality Date   INTRAMEDULLARY (IM) NAIL INTERTROCHANTERIC Right 12/09/2017   Procedure: INTRAMEDULLARY (IM) NAIL INTERTROCHANTRIC;  Surgeon: Fidel Rogue, MD;  Location: [facility] OR;  Service: Orthopedics;  Laterality: Right;   LEFT HEART CATH AND CORONARY ANGIOGRAPHY N/A 12/01/2023   Procedure: LEFT HEART CATH AND CORONARY ANGIOGRAPHY;  Surgeon: Verlin Lonni BIRCH, MD;  Location: [facility] INVASIVE CV LAB;  Service: Cardiovascular;  Laterality: N/A;   HPI:  89 yo M adm 07/10/24 with aphasia and facial droop s/p TNK, MRI (-). PMhx: DM, HLD, Rt femur fx, HTN, mitral regurgitation, CAD,  HFpEF.   Assessment / Plan / Recommendation Clinical Impression  Pt has made significant improvements and has returned to baseline per assessment and son's report. Language fluent during conversations, explanation and descriptions. He read and comprehended birthday card without difficulty, followed 3 step command and answered abstract y/n questions accurately. Informally, no noteable cognitive deficits in attention and awareness or problem solving. Son had no concerns with memory ans pt states he writes information to recall. Advised son to supervise med management and finances first time pt attempts at discharge. No further ST warranted.    SLP Assessment  SLP Recommendation/Assessment: Patient does not need any further Speech Language Pathology Services SLP Visit Diagnosis: Cognitive communication deficit (R41.841)     Assistance Recommended at Discharge  None  Functional Status Assessment Patient has not had a recent decline in their functional status  Frequency and Duration           SLP Evaluation Cognition  Overall Cognitive Status: Within Functional Limits for tasks assessed (family reports no deficits prior or present, no difficulty noted during assessment which focused primarily on language but attention, orientation,  [facility]) Arousal/Alertness: Awake/alert Orientation Level: Oriented X4 Attention: Sustained Sustained Attention: Appears intact Awareness: Appears intact (intellectual) Safety/Judgment: Appears intact       Comprehension  Auditory Comprehension Overall Auditory Comprehension: Appears within functional limits for tasks assessed (followed 3 step command) Commands: Within Functional Limits Visual Recognition/Discrimination Discrimination: Not tested Reading Comprehension Reading Status: Within funtional limits    Expression Expression Primary Mode of Expression: Verbal Verbal Expression Overall Verbal Expression: Appears within functional limits for tasks  assessed Written Expression Written Expression: Not tested   Oral /  Motor  Oral Motor/Sensory Function Overall Oral Motor/Sensory Function: Within functional limits Motor Speech Overall Motor Speech: Appears within functional limits for tasks assessed            Dustin Olam Bull 07/11/2024, 2:49 PM

## 2024-07-11 NOTE — TOC CAGE-AID Note (Signed)
 Transition of Care [facility]) - CAGE-AID Screening   Patient Details  Name: Shawn Stephenson MRN: [id number] Date of Birth: Jan 15, 1930  [facility]) CM/SW Contact:    Inocente GORMAN Kindle, LCSW Phone Number: 07/11/2024, 9:37 AM   Clinical Narrative: Patient is disoriented and unable to participate in screening at this time.    CAGE-AID Screening: Substance Abuse Screening unable to be completed due to: : Patient unable to participate

## 2024-07-11 NOTE — Discharge Summary (Addendum)
 Stroke Discharge Summary  Patient ID: Shawn [facility]   MRN: [id number]      DOB: 1929-12-14  Date of Admission: 07/10/2024 Date of Discharge: 07/11/2024  Attending Physician:  Stroke, Md, MD Consultant(s):    None  Patient's PCP:  Shawn Carwin, MD  DISCHARGE PRIMARY DIAGNOSIS:  Left brain TIA versus aborted stroke s/p TNK   Secondary Diagnoses: Hypertension Hyperlipidemia Diabetes Type II, Uncontrolled  Allergies as of 07/11/2024       Reactions   Enalapril Maleate Other (See Comments)   Swelling **Vasotec**   Losartan  Other (See Comments)   Makes him feel jittery   Menthol Itching   Shellfish Allergy Nausea And Vomiting   Sulfa Antibiotics Other (See Comments)   Doesn't recall reaction        Medication List     STOP taking these medications    aspirin  81 MG chewable tablet Replaced by: aspirin  EC 81 MG tablet       TAKE these medications    amLODipine  10 MG tablet Commonly known as: NORVASC  Take 1 tablet (10 mg total) by mouth daily.   aspirin  EC 81 MG tablet Take 1 tablet (81 mg total) by mouth daily. Swallow whole. Start taking on: July 12, 2024 Replaces: aspirin  81 MG chewable tablet   atorvastatin  40 MG tablet Commonly known as: LIPITOR Take 1 tablet (40 mg total) by mouth daily.   carvedilol  6.25 MG tablet Commonly known as: COREG  TAKE ONE TABLET BY MOUTH TWICE A DAY WITH A MEAL   clopidogrel  75 MG tablet Commonly known as: PLAVIX  Take 1 tablet (75 mg total) by mouth daily.   fexofenadine 180 MG tablet Commonly known as: ALLEGRA Take 180 mg by mouth daily.   glipiZIDE  5 MG 24 hr tablet Commonly known as: GLUCOTROL  XL Take 5 mg by mouth daily.   MAGNESIUM  PO Take 400 mg by mouth daily.   nitroGLYCERIN  0.4 MG SL tablet Commonly known as: NITROSTAT  Place 1 tablet (0.4 mg total) under the tongue every 5 (five) minutes as needed for chest pain.   pyridOXINE  25 MG tablet Commonly known as: VITAMIN B6 Take 25 mg by mouth  daily.   tamsulosin  0.4 MG Caps capsule Commonly known as: FLOMAX  TAKE ONE CAPSULE (0.4MG  TOTAL) BY MOUTH DAILY What changed: See the new instructions.   traMADol  50 MG tablet Commonly known as: ULTRAM  Take 50 mg by mouth in the morning, at noon, and at bedtime.   VITAMIN B 12 PO Take 1,000 mcg by mouth daily.   Vitamin D-3 25 MCG (1000 UT) Caps Take 1,000 Units by mouth daily.       LABORATORY STUDIES CBC    Component Value Date/Time   WBC 5.7 07/10/2024 0912   RBC 3.88 (L) 07/10/2024 0912   HGB 12.6 (L) 07/10/2024 0915   HCT 37.0 (L) 07/10/2024 0915   PLT 250 07/10/2024 0912   MCV 96.6 07/10/2024 0912   MCH 32.2 07/10/2024 0912   MCHC 33.3 07/10/2024 0912   RDW 13.3 07/10/2024 0912   LYMPHSABS 1.0 07/10/2024 0912   MONOABS 0.5 07/10/2024 0912   EOSABS 0.0 07/10/2024 0912   BASOSABS 0.0 07/10/2024 0912   CMP    Component Value Date/Time   NA 137 07/10/2024 0915   K 3.8 07/10/2024 0915   CL 97 (L) 07/10/2024 0915   CO2 24 07/10/2024 0912   GLUCOSE 190 (H) 07/10/2024 0915   BUN 17 07/10/2024 0915   CREATININE 0.90 07/10/2024 0915  CALCIUM  9.0 07/10/2024 0912   PROT 6.6 07/10/2024 0912   ALBUMIN 3.9 07/10/2024 0912   AST 30 07/10/2024 0912   ALT 35 07/10/2024 0912   ALKPHOS 121 07/10/2024 0912   BILITOT 0.7 07/10/2024 0912   GFRNONAA >60 07/10/2024 0912   GFRAA >60 12/11/2017 0348   COAGS Lab Results  Component Value Date   INR 1.1 07/10/2024   INR 1.2 12/01/2023   INR 1.0 11/29/2023   Lipid Panel    Component Value Date/Time   CHOL 119 07/11/2024 0510   TRIG 76 07/11/2024 0510   HDL 45 07/11/2024 0510   CHOLHDL 2.7 07/11/2024 0510   VLDL 15 07/11/2024 0510   LDLCALC 59 07/11/2024 0510   HgbA1C  Lab Results  Component Value Date   HGBA1C 8.5 (H) 07/11/2024   Drugs of Abuse     Component Value Date/Time   LABOPIA NEGATIVE 07/10/2024 1501   COCAINSCRNUR NEGATIVE 07/10/2024 1501   LABBENZ NEGATIVE 07/10/2024 1501   AMPHETMU NEGATIVE  07/10/2024 1501   THCU NEGATIVE 07/10/2024 1501   LABBARB NEGATIVE 07/10/2024 1501    Alcohol Level    Component Value Date/Time   Shawn Stephenson <15 07/10/2024 0912   SIGNIFICANT DIAGNOSTIC STUDIES EEG adult Result Date: 07/11/2024 Shawn Shawn KIDD, MD     07/11/2024 12:10 PM Patient Name: Shawn Stephenson MRN: [id number] Epilepsy Attending: Arlin Stephenson Shawn Referring Physician/Provider: Jerri Pfeiffer, MD Date: 07/11/2024 Duration: 23.03 mins Patient history:  89 y.o. adult who presented with acute onset aphasia. EEG to evaluate for seizure  Level of alertness: Awake, asleep AEDs during EEG study: None Technical aspects: This EEG study was done with scalp electrodes positioned according to the 10-20 International system of electrode placement. Electrical activity was reviewed with band pass filter of 1-70Hz , sensitivity of 7 uV/mm, display speed of 33mm/sec with a 60Hz  notched filter applied as appropriate. EEG data were recorded continuously and digitally stored.  Video monitoring was available and reviewed as appropriate. Description: The posterior dominant rhythm consists of 8 Hz activity of moderate voltage (25-35 uV) seen predominantly in posterior head regions, symmetric and reactive to eye opening and eye closing. Sleep was characterized by vertex waves, sleep spindles (12 to 14 Hz), maximal frontocentral region. There is continuous 3 to 6 Hz theta-delta slowing in left hemisphere. Hyperventilation and photic stimulation were not performed.   ABNORMALITY - Continuous slow, left hemisphere IMPRESSION: This study is suggestive of cortical dysfunction arising from left hemisphere likely secondary to underlying structural abnormality, post-ictal state. No seizures or epileptiform discharges were seen throughout the recording. Shawn Stephenson Shawn   MR BRAIN WO CONTRAST Result Date: 07/11/2024 EXAM: MRI BRAIN WITHOUT CONTRAST 07/11/2024 08:58:00 AM TECHNIQUE: Multiplanar multisequence MRI of the head/brain was  performed without the administration of intravenous contrast. COMPARISON: Previous brain MRI 11/29/2023. Head CT and CTA 07/10/2024. CLINICAL HISTORY: 89 year old male. Code stroke presentation yesterday morning. FINDINGS: Stable brain volume. BRAIN AND VENTRICLES: No acute infarct. No intracranial hemorrhage. No mass. No midline shift. No hydrocephalus. The sella is unremarkable. Normal flow voids. Chronic intracranial artery tortuosity appears stable. T2 and FLAIR hyperintensity appears stable from last year. Involvement of the bilateral deep white matter capsules. Chronic T2 heterogeneity in the deep gray nuclei and brainstem appears stable. No cortical encephalomalacia or chronic cerebral blood products identified. ORBITS: No significant abnormality. SINUSES AND MASTOIDS: No significant abnormality. BONES AND SOFT TISSUES: Normal marrow signal. No soft tissue abnormality. Normal for age visible cervical spine. IMPRESSION: 1. No acute intracranial abnormality. 2.  Stable non contrast MRI appearance of the brain since last year. Electronically signed by: Helayne Hurst MD 07/11/2024 10:17 AM EST RP Workstation: [id number]   ECHOCARDIOGRAM COMPLETE Result Date: 07/10/2024    ECHOCARDIOGRAM REPORT   Patient Name:   Shawn Stephenson Date of Exam: 07/10/2024 Medical Rec #:  [id number]         Height:       67.0 in Accession #:    [id number]        Weight:       147.0 lb Date of Birth:  [birth year]         BSA:          1.774 m Patient Age:    89 years          BP:           184/89 mmHg Patient Gender: M                 HR:           95 bpm. Exam Location:  Inpatient Procedure: 2D Echo, Cardiac Doppler and Color Doppler (Both Spectral and Color            Flow Doppler were utilized during procedure). Indications:    Stroke  History:        Patient has prior history of Echocardiogram examinations, most                 recent 11/30/2023. Stroke, Arrythmias:PVC; Risk Factors:Diabetes,                 Hypertension and  Dyslipidemia.  Sonographer:    Juliene Rucks Referring Phys: [id number] DEVON SHAFER IMPRESSIONS  1. Left ventricular ejection fraction, by estimation, is 60 to 65%. The left ventricle has normal function. The left ventricle has no regional wall motion abnormalities. Left ventricular diastolic parameters are indeterminate. Elevated left ventricular end-diastolic pressure.  2. Right ventricular systolic function is normal. The right ventricular size is normal.  3. The mitral valve is normal in structure. Mild mitral valve regurgitation. No evidence of mitral stenosis.  4. The aortic valve is normal in structure. Aortic valve regurgitation is mild. No aortic stenosis is present.  5. Abdominal aorta is normal. There is mild dilatation of the aortic root, measuring 38 mm.  6. The inferior vena cava is normal in size with greater than 50% respiratory variability, suggesting right atrial pressure of 3 mmHg. FINDINGS  Left Ventricle: Left ventricular ejection fraction, by estimation, is 60 to 65%. The left ventricle has normal function. The left ventricle has no regional wall motion abnormalities. The left ventricular internal cavity size was normal in size. There is  no left ventricular hypertrophy. Left ventricular diastolic parameters are indeterminate. Elevated left ventricular end-diastolic pressure. Right Ventricle: The right ventricular size is normal. No increase in right ventricular wall thickness. Right ventricular systolic function is normal. Left Atrium: Left atrial size was normal in size. Right Atrium: Right atrial size was normal in size. Pericardium: There is no evidence of pericardial effusion. Mitral Valve: The mitral valve is normal in structure. Mild mitral valve regurgitation. No evidence of mitral valve stenosis. Tricuspid Valve: The tricuspid valve is normal in structure. Tricuspid valve regurgitation is mild . No evidence of tricuspid stenosis. Aortic Valve: The aortic valve is normal in structure.  Aortic valve regurgitation is mild. No aortic stenosis is present. Pulmonic Valve: The pulmonic valve was normal in structure. Pulmonic valve regurgitation is not visualized. No evidence of pulmonic  stenosis. Aorta: Abdominal aorta is normal. There is mild dilatation of the aortic root, measuring 38 mm. Venous: The inferior vena cava is normal in size with greater than 50% respiratory variability, suggesting right atrial pressure of 3 mmHg. IAS/Shunts: No atrial level shunt detected by color flow Doppler.  LEFT VENTRICLE PLAX 2D LVOT diam:     2.00 cm   Diastology LV SV:         55        LV e' medial:    4.03 cm/s LV SV Index:   31        LV E/e' medial:  18.3 LVOT Area:     3.14 cm  LV e' lateral:   6.74 cm/s                          LV E/e' lateral: 10.9  RIGHT VENTRICLE RV Basal diam:  3.30 cm RV Mid diam:    2.80 cm RV S prime:     25.90 cm/s TAPSE (M-mode): 3.2 cm LEFT ATRIUM             Index        RIGHT ATRIUM           Index LA Vol (A2C):   31.5 ml 17.75 ml/m  RA Area:     15.70 cm LA Vol (A4C):   22.6 ml 12.74 ml/m  RA Volume:   37.60 ml  21.19 ml/m LA Biplane Vol: 28.1 ml 15.84 ml/m  AORTIC VALVE LVOT Vmax:   118.00 cm/s LVOT Vmean:  73.700 cm/s LVOT VTI:    0.176 m  AORTA Ao Root diam: 3.80 cm MITRAL VALVE MV Area (PHT): 3.03 cm     SHUNTS MV Decel Time: 250 msec     Systemic VTI:  0.18 m MV E velocity: 73.70 cm/s   Systemic Diam: 2.00 cm MV A velocity: 145.00 cm/s MV E/A ratio:  0.51 Kardie Tobb DO Electronically signed by Dub Huntsman DO Signature Date/Time: 07/10/2024/6:41:19 PM    Final    DG CHEST PORT 1 VIEW Result Date: 07/10/2024 CLINICAL DATA:  Stroke. EXAM: PORTABLE CHEST 1 VIEW COMPARISON:  Chest radiograph dated 12/09/2017. FINDINGS: No focal consolidation, pleural effusion or pneumothorax. The cardiac silhouette is within limits. Atherosclerotic calcification of the aorta. No acute osseous pathology. IMPRESSION: No active disease. Electronically Signed   By: Vanetta Chou M.D.    On: 07/10/2024 14:05   CT HEAD WO CONTRAST ( ) Result Date: 07/10/2024 EXAM: CT HEAD WITHOUT CONTRAST 07/10/2024 11:19:36 AM TECHNIQUE: CT of the head was performed without the administration of intravenous contrast. Automated exposure control, iterative reconstruction, and/or weight based adjustment of the mA/kV was utilized to reduce the radiation dose to as low as reasonably achievable. COMPARISON: CT head and CTA head and neck earlier today. CLINICAL HISTORY: 89 year old male, acute neurological deficit, stroke suspected, presenting this morning. FINDINGS: BRAIN AND VENTRICLES: No acute hemorrhage. No hydrocephalus. No extra-axial collection. No mass effect or midline shift. Calcified atherosclerosis at the skull base. Residual intravascular contrast. Stable gray white differentiation. No acute cortically based infarct identified. Patchy cerebral white matter hypodensity with some deep gray nuclei heterogeneity. No abnormal enhancement identified. ORBITS: No gaze deviation. SINUSES: No acute abnormality. SOFT TISSUES AND SKULL: No acute soft tissue abnormality. No skull fracture. IMPRESSION: 1. Residual intravascular contrast, otherwise stable CT appearance of the brain. No acute or evolving infarct is identified. Electronically signed by: Helayne Hurst MD  07/10/2024 11:24 AM EST RP Workstation: [id number]   CT ANGIO HEAD NECK W WO CM (CODE STROKE) Result Date: 07/10/2024 EXAM: CTA HEAD AND NECK WITH AND WITHOUT 07/10/2024 10:25:08 AM TECHNIQUE: CTA of the head and neck was performed with and without the administration of intravenous contrast. Multiplanar 2D and/or 3D reformatted images are provided for review. Automated exposure control, iterative reconstruction, and/or weight based adjustment of the mA/kV was utilized to reduce the radiation dose to as low as reasonably achievable. Stenosis of the internal carotid arteries measured using NASCET criteria. COMPARISON: CT head reported separately today. CTA  chest November 30, 2023. CLINICAL HISTORY: 89 year old male with acute neuro deficit, stroke suspected. FINDINGS: CTA NECK: AORTIC ARCH AND ARCH VESSELS: Tortuous distal aortic arch. Calcified arch atherosclerosis. 4 vessel arch configuration, the left vertebral artery arises directly from the arch (normal variant). CERVICAL CAROTID ARTERIES: Mild for age right ICA origin and bulb calcified atherosclerosis without stenosis. Right ICA siphon moderate calcified atherosclerosis with no significant stenosis. Mild left CCA atherosclerosis without stenosis. Confluent calcified plaque at the left carotid bifurcation, left ICA origin and bulb, with left ICA measuring 47% stenosis. Left ICA siphon moderate calcified plaque with mild supraclinoid segment stenosis. CERVICAL VERTEBRAL ARTERIES: Calcified plaque at the right vertebral artery origin with less than 50% stenosis. Mildly dominant right vertebral artery. Mild right V4 soft and calcified plaque without significant stenosis. Calcified plaque at the vertebrobasilar junction which remains patent, mild stenosis. Nondominant left vertebral artery arises directly from the arch with mild origin calcified plaque, no stenosis. Left V4 segment mild to moderate atherosclerosis and stenosis, patent left vertebrobasilar junction. LUNGS AND MEDIASTINUM: Visible upper lungs and mediastinum appear negative. SOFT TISSUES: Nonvascular neck soft tissues are negative. BONES: Ordinary degeneration in the spine. Mild parathoracic superior endplate compression at T2 and T4 is chronic. CTA HEAD: ANTERIOR CIRCULATION: Normal left posterior communicating artery origin. Normal right PCA and bilateral SCA origins. Right posterior communicating artery is diminutive or absent. Normal MCA and ACA origins. Normal anterior communicating artery. Mild left MCA M1 irregularity, no significant stenosis. POSTERIOR CIRCULATION: Bilateral AICA vessels appear dominant, normal variant. No basilar stenosis.  Moderate left PCA P2 segment stenosis. Similar moderate right P2 segment stenosis, no PCA branch occlusion identified. INTRACRANIAL VENOUS: Superior sagittal sinus is enhancing and appears patent. IMPRESSION: 1. No large vessel occlusion. Extracranial atherosclerosis with no significant stenosis. Intracranial atherosclerosis with up to moderate stenoses of the bilateral PCA (p2 segments), nondominant left vertebral v4 segment. 2. Aortic Atherosclerosis (ICD10-I70.0). 3. Salient findings communicated to Dr. Germaine at 10:33 AM on 07/10/2024 by text page via the [facility] messaging system. Electronically signed by: Helayne Hurst MD 07/10/2024 10:36 AM EST RP Workstation: [id number]   CT HEAD CODE STROKE WO CONTRAST (LKW 0-4.5h, LVO 0-24h) Result Date: 07/10/2024 EXAM: CT HEAD WITHOUT CONTRAST 07/10/2024 09:20:09 AM TECHNIQUE: CT of the head was performed without the administration of intravenous contrast. Automated exposure control, iterative reconstruction, and/or weight based adjustment of the mA/kV was utilized to reduce the radiation dose to as low as reasonably achievable. COMPARISON: Head CT and brain MRI on 11/29/2023. CLINICAL HISTORY: 89 year old male with code stroke presentation. FINDINGS: BRAIN AND VENTRICLES: No acute hemorrhage. No evidence of acute infarct. No hydrocephalus. No extra-axial collection. No mass effect or midline shift. Stable brain volume. Patchy and confluent cerebral white matter hypodensity in both hemispheres. Some deep white matter capsule involvement. Gray-white differentiation is stable. ORBITS: No gaze deviation. SINUSES: Paranasal sinuses, tympanic cavities, and mastoids are well aerated. SOFT  TISSUES AND SKULL: No acute soft tissue abnormality. No skull fracture. Advanced calcified atherosclerosis at the skull base. VASCULATURE: No suspicious intracranial vascular hyperdensity. ALBERTA STROKE PROGRAM EARLY CT SCORE (ASPECTS): Ganglionic (caudate, IC, lentiform nucleus, insula,  M1-M3): 7 Supraganglionic (M4-M6): 3 Total: 10 These results were communicated to Dr. Germaine at 09:42 hours on 07/10/2024 by text page via the [facility] messaging system. IMPRESSION: 1. Stable non-contrast CT appearance of the brain.  ASPECTS 10. 2. These results were communicated to Dr. Germaine at [phone] hours on 07/10/2024 by text page via the [facility] messaging system. Electronically signed by: Helayne Hurst MD 07/10/2024 09:45 AM EST RP Workstation: [id number]    HISTORY OF PRESENT ILLNESS 89 y.o. patient with history of coronary artery disease on aspirin  and Plavix , type diabetes mellitus, hypertension, hyperlipidemia, HFpEF was transferred from [facility] ED for acute onset of aphasia and right facial droop s/p TNKase  administration.   HOSPITAL COURSE Left brain TIA versus aborted stroke s/p TNK Etiology:  small vessel disease CT head: Stable noncontrast CT appearance of the brain.  Aspects 10. Repeat CT head with neurologic change following TNK: Residual intravascular contrast, otherwise stable CT appearance of the brain.  No acute or evolving infarct is identified. CTA head & neck no LVO. Extracranial atherosclerosis with no significant stenosis.  Intracranial atherosclerosis with up to moderate stenosis of the bilateral PCA (P2 segments), nondominant left vertebral V4 segment.  Aortic atherosclerosis. MRI no acute intracranial abnormality.  Stable noncontrast MRI appearance of the brain since last year. 2D Echo LVEF 60-65% Routine EEG: This study is suggestive of cortical dysfunction arising from left hemisphere likely secondary to underlying structural abnormality, post-ictal state. No seizures or epileptiform discharges were seen throughout the recording. LDL 59 HgbA1c 8.5 VTE prophylaxis -SCDs 30-day event monitor requested at discharge aspirin  81 mg daily and clopidogrel  75 mg daily prior to admission, now on aspirin  81 mg daily and clopidogrel  75 mg daily. Therapy recommendations:  Home  Health PT  Disposition: Discharge home with home health PT   Hypertension Home meds: Carvedilol , amlodipine  Stable Long-term blood pressure goal normotension   Hyperlipidemia Home meds:  atorvastatin  40 mg, resumed in hospital LDL 59, goal < 70 Continue statin at discharge   Diabetes type II Uncontrolled Home meds:  Glipizide  HgbA1c 8.5, goal < 7.0 CBGs SSI during admission Recommend close follow-up with PCP for better DM control    Other Stroke Risk Factors Family hx stroke (father, brother) Coronary artery disease Congestive heart failure   Other Active Problems    DISCHARGE EXAM PHYSICAL EXAM General:  Alert, well-nourished, well-developed patient in no acute distress Psych:  Mood and affect appropriate for situation CV: Regular rate and rhythm on monitor Respiratory:  Regular, unlabored respirations on room air GI: Abdomen soft and nontender   NEURO:  Mental Status: AA&Ox3, patient is able to give clear and coherent history of present illness.  Patient has some lapse of memory to events yesterday that is abnormal per family at bedside though much improved speech/language from yesterday.  Patient states he could not put things in a logical order, could not talk sensibly.  He states that this has happened once before though does not believe it was related to stroke at that time. Speech/Language: speech is without dysarthria or aphasia.  Naming, repetition, fluency, and comprehension intact.   Cranial Nerves:  II: PERRL. Visual fields full.  III, IV, VI: EOMI. Eyelids elevate symmetrically.  V: Sensation is intact to light touch and symmetrical to face.  VII: Face is symmetrical resting and and with movement VIII: Hearing intact to voice. IX, X: Palate elevates symmetrically. Phonation is normal.  XI: Shoulder shrug 5/5. XII: Tongue is midline without fasciculations. Motor: 5/5 strength to all muscle groups tested.  Tone: is normal and bulk is normal Sensation:  Intact to light touch bilaterally. Extinction absent to light touch to DSS.   Coordination: FTN intact bilaterally, HKS: no ataxia in BLE.No drift.  Gait: Deferred  Discharge Diet       Diet   Diet regular Room service appropriate? Yes with Assist; Fluid consistency: Thin   liquids  DISCHARGE PLAN Disposition: Home with Home Health clopidogrel  75 mg daily for secondary stroke prevention  Ongoing stroke risk factor control by Primary Care Physician at time of discharge Follow-up PCP Shawn Carwin, MD in 2 weeks. Follow-up in [facility] in 8 weeks, office to schedule an appointment. Able to see NP in clinic.  35 minutes were spent preparing discharge.  Stevi Toberman, AGACNP-BC [facility] Pager: [phone]  ATTENDING NOTE: I reviewed above note and agree with the assessment and plan. Pt was seen and examined.   Son at the bedside. Pt lying in bed, neuro intact. He was admitted for aphasia and R facial droop s/p TNK and currently symptoms free. He still has mild R nasolabial fold flattering, but per son, it is his normal look. Denies heart palpitation but recommend 30 day monitoring. EEG neg. Continue home DAPT and statin. PT and OT recommend HH. Pt medically sound for discharge home. Follow up at [facility]  For detailed assessment and plan, please refer to above as I have made changes wherever appropriate.   Ary Cummins, MD PhD Stroke Neurology 07/11/2024 9:50 PM

## 2024-07-11 NOTE — Progress Notes (Signed)
 Pts dexcom removed, pts daughter and son aware. CBG checked, and ordered q4.

## 2024-07-11 NOTE — Evaluation (Signed)
 Physical Therapy Evaluation Patient Details Name: Shawn Stephenson MRN: [id number] DOB: 10/22/1929 Today's Date: 07/11/2024  History of Present Illness  89 yo M adm 07/10/24 with aphasia and facial droop s/p TNK, MRI (-). PMhx: DM, HLD, Rt femur fx, HTN, mitral regurgitation, CAD, HFpEF.  Clinical Impression  Pt pleasant and very willing to get OOB. At baseline pt is independent without AD, lives with wife in 2 story home with stair lift and has a cook/housekeeper who assists 6 days a week and family visit on Sundays. Pt demonstrates 4-5/5 bil LB strength with intact sensation and function and mild gait deficit with reports of feeling LLE fatigue with gait. [name] will benefit from continued acute therapy to maximize gait and function as well as HHPT. Encouraged mobility and gait with staff assist acutely.       If plan is discharge home, recommend the following: Assistance with cooking/housework;Assist for transportation;Help with stairs or ramp for entrance;A little help with walking and/or transfers   Can travel by private vehicle        Equipment Recommendations None recommended by PT  Recommendations for Other Services       Functional Status Assessment Patient has had a recent decline in their functional status and demonstrates the ability to make significant improvements in function in a reasonable and predictable amount of time.     Precautions / Restrictions Precautions Precautions: Fall      Mobility  Bed Mobility Overal bed mobility: Modified Independent                  Transfers Overall transfer level: Modified independent                      Ambulation/Gait Ambulation/Gait assistance: Supervision Gait Distance (Feet): 150 Feet Assistive device: Rolling walker (2 wheels) Gait Pattern/deviations: Step-through pattern, Decreased stride length   Gait velocity interpretation: <1.8 ft/sec, indicate of risk for recurrent falls   General Gait  Details: pt initially stood without UB support and tried to take a few steps but immediately felt off balance and reaching for environment, RW for gait with cues for proximity to device and direction  Stairs            Wheelchair Mobility     Tilt Bed    Modified Rankin (Stroke Patients Only) Modified Rankin (Stroke Patients Only) Pre-Morbid Rankin Score: Slight disability Modified Rankin: Moderate disability     Balance Overall balance assessment: Needs assistance Sitting-balance support: No upper extremity supported, Feet supported Sitting balance-Leahy Scale: Good     Standing balance support: Bilateral upper extremity supported Standing balance-Leahy Scale: Fair Standing balance comment: can static stand without support, RW for gait                             Pertinent Vitals/Pain Pain Assessment Pain Score: 3  Pain Location: LLE with gait Pain Descriptors / Indicators: Aching Pain Intervention(s): Limited activity within patient's tolerance, Repositioned    Home Living Family/patient expects to be discharged to:: Private residence Living Arrangements: Spouse/significant other Available Help at Discharge: Family;Available PRN/intermittently Type of Home: House Home Access: Stairs to enter   Entrance Stairs-Number of Steps: 2 Alternate Level Stairs-Number of Steps: stair lift Home Layout: Two level;Bed/bath upstairs Home Equipment: Rolling Walker (2 wheels);Cane - single point;Grab bars - toilet;Grab bars - tub/shower;Shower seat Additional Comments: lives with wife of 63 years who had recent back sx  Prior Function Prior Level of Function : Independent/Modified Independent             Mobility Comments: pt normally walks without AD. walked 63' to mailbox but mostly homebound ADLs Comments: cook/housekeeper 5 hrs a day for 6 days and family comes to assist on sundays     Extremity/Trunk Assessment   Upper Extremity Assessment Upper  Extremity Assessment: Defer to OT evaluation    Lower Extremity Assessment Lower Extremity Assessment: Overall WFL for tasks assessed    Cervical / Trunk Assessment Cervical / Trunk Assessment: Normal  Communication   Communication Communication: No apparent difficulties    Cognition Arousal: Alert Behavior During Therapy: WFL for tasks assessed/performed   PT - Cognitive impairments: No apparent impairments                         Following commands: Intact       Cueing Cueing Techniques: Verbal cues     General Comments      Exercises     Assessment/Plan    PT Assessment Patient needs continued PT services  PT Problem List Decreased activity tolerance;Decreased balance;Decreased mobility;Decreased knowledge of use of DME       PT Treatment Interventions DME instruction;Gait training;Stair training;Functional mobility training;Therapeutic activities;Patient/family education;Balance training;Therapeutic exercise    PT Goals (Current goals can be found in the Care Plan section)  Acute Rehab PT Goals Patient Stated Goal: return home PT Goal Formulation: With patient/family Time For Goal Achievement: 07/18/24 Potential to Achieve Goals: Good    Frequency Min 2X/week     Co-evaluation               AM-PAC PT 6 Clicks Mobility  Outcome Measure Help needed turning from your back to your side while in a flat bed without using bedrails?: None Help needed moving from lying on your back to sitting on the side of a flat bed without using bedrails?: A Little Help needed moving to and from a bed to a chair (including a wheelchair)?: A Little Help needed standing up from a chair using your arms (e.g., wheelchair or bedside chair)?: A Little Help needed to walk in hospital room?: A Little Help needed climbing 3-5 steps with a railing? : A Little 6 Click Score: 19    End of Session Equipment Utilized During Treatment: Gait belt Activity Tolerance:  Patient tolerated treatment well Patient left: with call bell/phone within reach;Other (comment);with family/visitor present (on toilet with son present and RN aware) Nurse Communication: Mobility status PT Visit Diagnosis: Other abnormalities of gait and mobility (R26.89);Difficulty in walking, not elsewhere classified (R26.2)    Time: 8988-8958 PT Time Calculation (min) (ACUTE ONLY): 30 min   Charges:   PT Evaluation $PT Eval Moderate Complexity: 1 Mod PT Treatments $Gait Training: 8-22 mins PT General Charges $$ ACUTE PT VISIT: 1 Visit         [name], PT Acute Rehabilitation Services Office: [phone]   [name] Shikha Bibb 07/11/2024, 11:18 AM

## 2024-07-11 NOTE — Evaluation (Signed)
 Occupational Therapy Evaluation Patient Details Name: Shawn Stephenson MRN: [id number] DOB: August 02, 1929 Today's Date: 07/11/2024   History of Present Illness   89 yo M adm 07/10/24 with aphasia and facial droop s/p TNK, MRI (-). PMhx: DM, HLD, Rt femur fx, HTN, mitral regurgitation, CAD, HFpEF.     Clinical Impressions Pt seen for OT evaluation this PM. Supportive son at bedside; pt very pleasant & agreeable to visit. PTA, he lived with his wife (whom just recently had back surgery) and was indep with ADLs and mobility without AD. Today, he presents with generalized weakness in BUE, intact cognition (aside from some forgetfulness) and balance deficits. Functionally, CGA for all mobility/transfers with RW and min A for LB ADLs. CGA to perform UB ADLs in bathroom. VSS.   Pt is functioning slightly below his functional baseline, will continue to follow while in house but do not anticipate any follow-up OT needs upon d/c. RN updated.      If plan is discharge home, recommend the following:   A little help with bathing/dressing/bathroom;Assist for transportation     Functional Status Assessment   Patient has had a recent decline in their functional status and demonstrates the ability to make significant improvements in function in a reasonable and predictable amount of time.     Equipment Recommendations   None recommended by OT (pt has adequate DME)     Recommendations for Other Services         Precautions/Restrictions   Precautions Precautions: Fall Restrictions Weight Bearing Restrictions Per Provider Order: No     Mobility Bed Mobility Overal bed mobility: Needs Assistance Bed Mobility: Supine to Sit, Sit to Supine     Supine to sit: Supervision Sit to supine: Supervision   General bed mobility comments: exited to the R side, HOB elevated    Transfers Overall transfer level: Modified independent Equipment used: Rolling walker (2 wheels)                General transfer comment: stood from bed with demonstration of good hand placement/technique      Balance Overall balance assessment: Needs assistance Sitting-balance support: No upper extremity supported, Feet supported Sitting balance-Leahy Scale: Good Sitting balance - Comments: seated EOB, no overt LOB   Standing balance support: Bilateral upper extremity supported, During functional activity, Reliant on assistive device for balance Standing balance-Leahy Scale: Fair Standing balance comment: fairly reliant on RW, although able to stand sinkside to perform ADLs without difficulty/LOB and no UE support                           ADL either performed or assessed with clinical judgement   ADL Overall ADL's : Needs assistance/impaired Eating/Feeding: Set up;Sitting   Grooming: Contact guard assist;Standing;Wash/dry hands Grooming Details (indicate cue type and reason): sinkside         Upper Body Dressing : Set up;Sitting   Lower Body Dressing: Minimal assistance;Sitting/lateral leans Lower Body Dressing Details (indicate cue type and reason): via modified figure four technique Toilet Transfer: Contact guard assist;Ambulation;Regular Toilet;BSC/3in1;Rolling walker (2 wheels) (BSC frame placed over top of toilet seat) Toilet Transfer Details (indicate cue type and reason): cued to approach safely Toileting- Clothing Manipulation and Hygiene: Contact guard assist;Sit to/from stand       Functional mobility during ADLs: Contact guard assist;Rolling walker (2 wheels)       Vision Baseline Vision/History: 0 No visual deficits (wears readers) Ability to See in Adequate Light:  0 Adequate Patient Visual Report: No change from baseline       Perception         Praxis         Pertinent Vitals/Pain Pain Assessment Pain Assessment: No/denies pain Pain Score: 0-No pain     Extremity/Trunk Assessment Upper Extremity Assessment Upper Extremity Assessment:  Generalized weakness   Lower Extremity Assessment Lower Extremity Assessment: Defer to PT evaluation   Cervical / Trunk Assessment Cervical / Trunk Assessment: Normal   Communication Communication Communication: No apparent difficulties   Cognition Arousal: Alert Behavior During Therapy: WFL for tasks assessed/performed Cognition: No apparent impairments             OT - Cognition Comments: cognitively sharp, forgetful at times (suspect age-related)                 Following commands: Intact       Cueing  General Comments   Cueing Techniques: Verbal cues  supportive son, [name], present   Exercises     Shoulder Instructions      Home Living Family/patient expects to be discharged to:: Private residence Living Arrangements: Spouse/significant other Available Help at Discharge: Family;Available PRN/intermittently Type of Home: House Home Access: Stairs to enter Entrance Stairs-Number of Steps: 2   Home Layout: Two level;Bed/bath upstairs Alternate Level Stairs-Number of Steps: stair lift to second floor   Bathroom Shower/Tub: Walk-in shower   Bathroom Toilet: Handicapped height     Home Equipment: Agricultural Consultant (2 wheels);Cane - single point;Grab bars - toilet;Grab bars - tub/shower;Shower seat   Additional Comments: lives with wife of 63 years who had recent back sx  Lives With: Spouse    Prior Functioning/Environment Prior Level of Function : Independent/Modified Independent             Mobility Comments: pt normally walks without AD. walked 8' to mailbox but mostly homebound ADLs Comments: indep with BADLs, has cleaning/housekeeping lady who comes for 5 hrs/day for 6 days, family visits on Sunday's    OT Problem List: Decreased strength;Decreased range of motion;Impaired balance (sitting and/or standing)   OT Treatment/Interventions: Self-care/ADL training;Therapeutic exercise;DME and/or AE instruction;Patient/family education;Balance  training      OT Goals(Current goals can be found in the care plan section)   Acute Rehab OT Goals Patient Stated Goal: to go home to be with his wife (pt anticipating d/c later today) OT Goal Formulation: With patient/family Time For Goal Achievement: 07/25/24 Potential to Achieve Goals: Good   OT Frequency:  Min 1X/week    Co-evaluation              AM-PAC OT 6 Clicks Daily Activity     Outcome Measure Help from another person eating meals?: None Help from another person taking care of personal grooming?: A Little Help from another person toileting, which includes using toliet, bedpan, or urinal?: A Little Help from another person bathing (including washing, rinsing, drying)?: A Little Help from another person to put on and taking off regular upper body clothing?: None Help from another person to put on and taking off regular lower body clothing?: A Little 6 Click Score: 20   End of Session Equipment Utilized During Treatment: Gait belt;Rolling walker (2 wheels) Nurse Communication: Mobility status  Activity Tolerance: Patient tolerated treatment well Patient left: in bed;with call bell/phone within reach;with bed alarm set;with family/visitor present  OT Visit Diagnosis: Unsteadiness on feet (R26.81);Other abnormalities of gait and mobility (R26.89)  Time: 8661-8595 OT Time Calculation (min): 26 min Charges:  OT General Charges $OT Visit: 1 Visit OT Evaluation $OT Eval Low Complexity: 1 Low OT Treatments $Self Care/Home Management : 8-22 mins  Svea Pusch M. [place], OTR/L [facility] Acute Rehabilitation Services [phone] Secure Chat Preferred  Jax Abdelrahman 07/11/2024, 3:58 PM

## 2024-07-12 ENCOUNTER — Telehealth: Payer: Self-pay

## 2024-07-12 NOTE — Transitions of Care (Post Inpatient/ED Visit) (Signed)
 "  07/12/2024  Name: Shawn Stephenson MRN: [id number] DOB: 1929-11-16  Today's TOC FU Call Status: Today's TOC FU Call Status:: Successful TOC FU Call Completed TOC FU Call Complete Date: 07/12/24  Patient's Name and Date of Birth confirmed. Name, DOB  Transition Care Management Follow-up Telephone Call Date of Discharge: 07/11/24 Discharge Facility: [facility]) Type of Discharge: Inpatient Admission Primary Inpatient Discharge Diagnosis:: CVA How have you been since you were released from the hospital?: Better Any questions or concerns?: No  Items Reviewed: Did you receive and understand the discharge instructions provided?: Yes Medications obtained,verified, and reconciled?: Yes (Medications Reviewed) Any new allergies since your discharge?: No Dietary orders reviewed?: Yes Type of Diet Ordered:: Heart Healthy Carb modified Do you have support at home?: Yes People in Home [RPT]: spouse Name of Support/Comfort Primary Source: Dorothe  Medications Reviewed Today: Medications Reviewed Today     Reviewed by Lakeyshia Tuckerman, RN (Case Manager) on 07/12/24 at 1153  Med List Status: <None>   Medication Order Taking? Sig Documenting Provider Last Dose Status Informant  amLODipine  (NORVASC ) 10 MG tablet [id number] Yes Take 1 tablet (10 mg total) by mouth daily. Jordan, Peter M, MD  Active Pharmacy Records, Self  aspirin  EC 81 MG tablet [id number] Yes Take 1 tablet (81 mg total) by mouth daily. Swallow whole. Toberman, Stevi W, NP  Active   atorvastatin  (LIPITOR) 40 MG tablet [id number] Yes Take 1 tablet (40 mg total) by mouth daily. Jordan, Peter M, MD  Active Self, Pharmacy Records  carvedilol  (COREG ) 6.25 MG tablet [id number] Yes TAKE ONE TABLET BY MOUTH TWICE A DAY WITH A MEAL Jordan, Peter M, MD  Active Self, Pharmacy Records  Cholecalciferol  (VITAMIN D-3) 1000 UNITS CAPS [id number] Yes Take 1,000 Units by mouth daily. [provider]  Active Self, Pharmacy Records   clopidogrel  (PLAVIX ) 75 MG tablet [id number] Yes Take 1 tablet (75 mg total) by mouth daily. Arlon Carliss ORN, DO  Active Self, Pharmacy Records  Cyanocobalamin  (VITAMIN B 12 PO) [id number] Yes Take 1,000 mcg by mouth daily.  [provider]  Active Self, Pharmacy Records  fexofenadine (ALLEGRA) 180 MG tablet [id number] Yes Take 180 mg by mouth daily. [provider]  Active Self, Pharmacy Records           Med Note SHELVY JOESPH CROME   Sat Dec 09, 2017  3:09 PM)    glipiZIDE  (GLUCOTROL ) 5 MG 24 hr tablet [id number] Yes Take 5 mg by mouth daily. [provider]  Active Self, Pharmacy Records  MAGNESIUM  PO [id number] Yes Take 400 mg by mouth daily. [provider]  Active Self, Pharmacy Records  nitroGLYCERIN  (NITROSTAT ) 0.4 MG SL tablet [id number] Yes Place 1 tablet (0.4 mg total) under the tongue every 5 (five) minutes as needed for chest pain. Jordan, Peter M, MD  Active Self, Pharmacy Records  pyridOXINE  (VITAMIN B6) 25 MG tablet [id number] Yes Take 25 mg by mouth daily. [provider]  Active Self, Pharmacy Records  tamsulosin  (FLOMAX ) 0.4 MG CAPS capsule [id number] Yes TAKE ONE CAPSULE (0.4MG  TOTAL) BY MOUTH DAILY  Patient taking differently: Take 0.4 mg by mouth daily after supper.   Matilda Senior, MD  Active Self, Pharmacy Records  traMADol  (ULTRAM ) 50 MG tablet [id number]  Take 50 mg by mouth in the morning, at noon, and at bedtime. [provider]  Active Self, Pharmacy Records            Home Care and Equipment/Supplies: Were Home Health Services Ordered?:  Yes Name of Home Health Agency:: [facility] Has Agency set up a time to come to your home?: No (Patient states that someone has called but no time set yet) Any new equipment or medical supplies ordered?: NA  Functional Questionnaire: Do you need assistance with bathing/showering or dressing?: No Do you need assistance with meal preparation?: No Do you need assistance with eating?:  No Do you have difficulty maintaining continence: No Do you need assistance with getting out of bed/getting out of a chair/moving?: No Do you have difficulty managing or taking your medications?: No  Follow up appointments reviewed: PCP Follow-up appointment confirmed?: No (Patient to call PCP) MD Provider Line Number:[phone] Given: No Specialist Hospital Follow-up appointment confirmed?: No Reason Specialist Follow-Up Not Confirmed:  (Per instructions [facility] to call patient) Do you need transportation to your follow-up appointment?: No Do you understand care options if your condition(s) worsen?: Yes-patient verbalized understanding  SDOH Interventions Today    Flowsheet Row Most Recent Value  SDOH Interventions   Food Insecurity Interventions Intervention Not Indicated  Housing Interventions Intervention Not Indicated  Transportation Interventions Intervention Not Indicated  Utilities Interventions Intervention Not Indicated   Discussed and offered 30 day TOC program.  Patient  declined.    The patient has been provided with contact information for the care management team and has been advised to call with any health -related questions or concerns.  The patient verbalized understanding with current plan of care.  The patient is directed to their insurance card regarding availability of benefits coverage.    Enrika Aguado J. Savaya Hakes RN, MSN [facility], [facility] RN Care Manager Direct Dial: [phone]  Fax: [phone] Website: [email or web address]   "

## 2024-07-12 NOTE — Patient Instructions (Signed)
 Visit Information  Thank you for taking time to visit with me today. Please don't hesitate to contact me if I can be of assistance to you   PLEASE MONITOR FOR:  Sudden numbness or weakness in the face, arm, or leg, especially on one side of the body. Sudden confusion, trouble speaking, or difficulty understanding speech. Sudden trouble seeing in one or both eyes. Sudden trouble walking, dizziness, loss of balance, or lack of coordination. Sudden severe headache with no known cause. Call 9-1-1 right away if you have any of these symptoms.   The patient verbalized understanding of instructions, educational materials, and care plan provided today and DECLINED offer to receive copy of patient instructions, educational materials, and care plan.   The patient has been provided with contact information for the care management team and has been advised to call with any health related questions or concerns.   Please call the care guide team at [phone] if you need to cancel or reschedule your appointment.   Please call the Suicide and Crisis Lifeline: 988 if you are experiencing a Mental Health or Behavioral Health Crisis or need someone to talk to.  Kees Idrovo J. Rodel Glaspy RN, MSN [facility], [facility] RN Care Manager Direct Dial: [phone]  Fax: [phone] Website: [email or web address]
